# Patient Record
Sex: Female | Born: 1990 | Hispanic: No | Marital: Married | Smoking: Never smoker
Health system: Southern US, Community
[De-identification: ages and names within clinical notes are randomized; demographics above are authoritative.]

## PROBLEM LIST (undated history)

## (undated) ENCOUNTER — Inpatient Hospital Stay (HOSPITAL_COMMUNITY): Payer: Self-pay

## (undated) DIAGNOSIS — N979 Female infertility, unspecified: Secondary | ICD-10-CM

## (undated) DIAGNOSIS — J45909 Unspecified asthma, uncomplicated: Secondary | ICD-10-CM

## (undated) DIAGNOSIS — D649 Anemia, unspecified: Secondary | ICD-10-CM

## (undated) DIAGNOSIS — R51 Headache: Secondary | ICD-10-CM

## (undated) DIAGNOSIS — E559 Vitamin D deficiency, unspecified: Secondary | ICD-10-CM

## (undated) DIAGNOSIS — O24419 Gestational diabetes mellitus in pregnancy, unspecified control: Secondary | ICD-10-CM

## (undated) DIAGNOSIS — R7303 Prediabetes: Secondary | ICD-10-CM

## (undated) DIAGNOSIS — G43909 Migraine, unspecified, not intractable, without status migrainosus: Secondary | ICD-10-CM

## (undated) HISTORY — DX: Vitamin D deficiency, unspecified: E55.9

## (undated) HISTORY — DX: Female infertility, unspecified: N97.9

## (undated) HISTORY — PX: WISDOM TOOTH EXTRACTION: SHX21

## (undated) HISTORY — DX: Unspecified asthma, uncomplicated: J45.909

## (undated) HISTORY — DX: Prediabetes: R73.03

---
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
## 2013-02-01 ENCOUNTER — Ambulatory Visit: Payer: Self-pay | Admitting: Family Medicine

## 2013-02-01 VITALS — BP 100/78 | HR 81 | Temp 98.4°F | Resp 18 | Ht 61.75 in | Wt 130.0 lb

## 2013-02-01 DIAGNOSIS — L509 Urticaria, unspecified: Secondary | ICD-10-CM

## 2013-02-01 DIAGNOSIS — G43909 Migraine, unspecified, not intractable, without status migrainosus: Secondary | ICD-10-CM

## 2013-02-01 MED ORDER — PREDNISONE 20 MG PO TABS: ORAL_TABLET | ORAL | Status: DC

## 2013-02-01 MED ORDER — KETOPROFEN 50 MG PO CAPS: 50.0000 mg | ORAL_CAPSULE | Freq: Four times a day (QID) | ORAL | Status: DC | PRN

## 2013-02-01 NOTE — Progress Notes (Signed)
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  No new meds or detergents.  No shortness of breath or throat swelling.  Does not respond to benadryl which also makes her drowsy.  Positive h/o of asthma   Objective:  NAD Diffuse wheals on extremities Chest:  Clear Oroph:  Clear   Assessment:  Idiopathic urticaria

## 2013-02-01 NOTE — Patient Instructions (Signed)
Try also Zantac (ranitidine is the generic)  Hives Hives are itchy, red, swollen areas of the skin. They can vary in size and location on your body. Hives can come and go for hours or several days (acute hives) or for several weeks (chronic hives). Hives do not spread from person to person (noncontagious). They may get worse with scratching, exercise, and emotional stress. CAUSES   Allergic reaction to food, additives, or drugs.  Infections, including the common cold.  Illness, such as vasculitis, lupus, or thyroid disease.  Exposure to sunlight, heat, or cold.  Exercise.  Stress.  Contact with chemicals. SYMPTOMS   Red or white swollen patches on the skin. The patches may change size, shape, and location quickly and repeatedly.  Itching.  Swelling of the hands, feet, and face. This may occur if hives develop deeper in the skin. DIAGNOSIS  Your caregiver can usually tell what is wrong by performing a physical exam. Skin or blood tests may also be done to determine the cause of your hives. In some cases, the cause cannot be determined. TREATMENT  Mild cases usually get better with medicines such as antihistamines. Severe cases may require an emergency epinephrine injection. If the cause of your hives is known, treatment includes avoiding that trigger.  HOME CARE INSTRUCTIONS   Avoid causes that trigger your hives.  Take antihistamines as directed by your caregiver to reduce the severity of your hives. Non-sedating or low-sedating antihistamines are usually recommended. Do not drive while taking an antihistamine.  Take any other medicines prescribed for itching as directed by your caregiver.  Wear loose-fitting clothing.  Keep all follow-up appointments as directed by your caregiver. SEEK MEDICAL CARE IF:   You have persistent or severe itching that is not relieved with medicine.  You have painful or swollen joints. SEEK IMMEDIATE MEDICAL CARE IF:   You have a  fever.  Your tongue or lips are swollen.  You have trouble breathing or swallowing.  You feel tightness in the throat or chest.  You have abdominal pain. These problems may be the first sign of a life-threatening allergic reaction. Call your local emergency services (911 in [place] MAKE SURE YOU:   Understand these instructions.  Will watch your condition.  Will get help right away if you are not doing well or get worse. Document Released: 12/16/2005 Document Revised: 06/16/2012 Document Reviewed: 03/10/2012 [facility] Patient Information 2013 [place], [place]. Migraine Headache A migraine headache is an intense, throbbing pain on one or both sides of your head. A migraine can last for 30 minutes to several hours. CAUSES  The exact cause of a migraine headache is not always known. However, a migraine may be caused when nerves in the brain become irritated and release chemicals that cause inflammation. This causes pain. SYMPTOMS  Pain on one or both sides of your head.  Pulsating or throbbing pain.  Severe pain that prevents daily activities.  Pain that is aggravated by any physical activity.  Nausea, vomiting, or both.  Dizziness.  Pain with exposure to bright lights, loud noises, or activity.  General sensitivity to bright lights, loud noises, or smells. Before you get a migraine, you may get warning signs that a migraine is coming (aura). An aura may include:  Seeing flashing lights.  Seeing bright spots, halos, or zig-zag lines.  Having tunnel vision or blurred vision.  Having feelings of numbness or tingling.  Having trouble talking.  Having muscle weakness. MIGRAINE TRIGGERS  Alcohol.  Smoking.  Stress.  Menstruation.  Aged cheeses.  Foods or drinks that contain nitrates, glutamate, aspartame, or tyramine.  Lack of sleep.  Chocolate.  Caffeine.  Hunger.  Physical exertion.  Fatigue.  Medicines used to treat chest pain (nitroglycerine),  birth control pills, estrogen, and some blood pressure medicines. DIAGNOSIS  A migraine headache is often diagnosed based on:  Symptoms.  Physical examination.  A CT scan or MRI of your head. TREATMENT Medicines may be given for pain and nausea. Medicines can also be given to help prevent recurrent migraines.  HOME CARE INSTRUCTIONS  Only take over-the-counter or prescription medicines for pain or discomfort as directed by your caregiver. The use of long-term narcotics is not recommended.  Lie down in a dark, quiet room when you have a migraine.  Keep a journal to find out what may trigger your migraine headaches. For example, write down:  What you eat and drink.  How much sleep you get.  Any change to your diet or medicines.  Limit alcohol consumption.  Quit smoking if you smoke.  Get 7 to 9 hours of sleep, or as recommended by your caregiver.  Limit stress.  Keep lights dim if bright lights bother you and make your migraines worse. SEEK IMMEDIATE MEDICAL CARE IF:   Your migraine becomes severe.  You have a fever.  You have a stiff neck.  You have vision loss.  You have muscular weakness or loss of muscle control.  You start losing your balance or have trouble walking.  You feel faint or pass out.  You have severe symptoms that are different from your first symptoms. MAKE SURE YOU:   Understand these instructions.  Will watch your condition.  Will get help right away if you are not doing well or get worse. Document Released: 12/16/2005 Document Revised: 03/09/2012 Document Reviewed: 12/06/2011 [facility] Patient Information 2013 [place], [place].

## 2013-07-18 ENCOUNTER — Inpatient Hospital Stay (HOSPITAL_COMMUNITY)
Admission: AD | Admit: 2013-07-18 | Discharge: 2013-07-18 | Disposition: A | Discharge status: Home / Self Care | Payer: Medicaid Other | Source: Ambulatory Visit | Attending: Obstetrics & Gynecology | Admitting: Obstetrics & Gynecology

## 2013-07-18 ENCOUNTER — Encounter (HOSPITAL_COMMUNITY): Payer: Self-pay | Admitting: *Deleted

## 2013-07-18 DIAGNOSIS — O26899 Other specified pregnancy related conditions, unspecified trimester: Secondary | ICD-10-CM

## 2013-07-18 DIAGNOSIS — R109 Unspecified abdominal pain: Secondary | ICD-10-CM

## 2013-07-18 DIAGNOSIS — R1032 Left lower quadrant pain: Secondary | ICD-10-CM | POA: Insufficient documentation

## 2013-07-18 DIAGNOSIS — K5900 Constipation, unspecified: Secondary | ICD-10-CM | POA: Insufficient documentation

## 2013-07-18 DIAGNOSIS — O99891 Other specified diseases and conditions complicating pregnancy: Secondary | ICD-10-CM | POA: Insufficient documentation

## 2013-07-18 LAB — URINALYSIS, ROUTINE W REFLEX MICROSCOPIC
Glucose, UA: NEGATIVE mg/dL
Hgb urine dipstick: NEGATIVE
Ketones, ur: NEGATIVE mg/dL
Protein, ur: NEGATIVE mg/dL
Urobilinogen, UA: 0.2 mg/dL (ref 0.0–1.0)

## 2013-07-18 LAB — WET PREP, GENITAL
Clue Cells Wet Prep HPF POC: NONE SEEN
Yeast Wet Prep HPF POC: NONE SEEN

## 2013-07-18 LAB — URINE MICROSCOPIC-ADD ON

## 2013-07-18 NOTE — MAU Provider Note (Signed)
History     CSN: [id number]  Arrival date and time: 07/18/13 1222   First Provider Initiated Contact with Patient 07/18/13 1313      Chief Complaint  Patient presents with  . Possible Pregnancy  . Abdominal Pain   HPI  Pt is [redacted]w[redacted]d pregnant and presents with left lower quadrant pain for 1 to 1/2 weeks.  Pt has constipation.  Pt does have nausea and is dizzy in the morning when she wakes up.  Pt's pain is not accentuated with movement.  Sleep makes her pain better.Pt states pain is a little better after bowel movement.  Pt also has a vaginal discharge with vaginal itching.  Pt denies UTI symptoms.  Pt denies spotting or bleeding since May.   Past Medical History  Diagnosis Date  . Asthma   . Asthma     History reviewed. No pertinent past surgical history.  Family History  Problem Relation Age of Onset  . Diabetes Mother     History  Substance Use Topics  . Smoking status: Never Smoker   . Smokeless tobacco: Not on file  . Alcohol Use: No    Allergies: No Known Allergies  No prescriptions prior to admission    Review of Systems  Gastrointestinal: Positive for nausea, abdominal pain and constipation. Negative for vomiting and diarrhea.  Neurological: Positive for dizziness.   Physical Exam   Blood pressure 126/91, pulse 93, temperature 97.8 F (36.6 C), temperature source Oral, resp. rate 16, height 5\' 1"  (1.549 m), weight 61.236 kg (135 lb), last menstrual period 04/22/2013.  Physical Exam  Vitals reviewed. Constitutional: She is oriented to person, place, and time. She appears well-developed and well-nourished. No distress.  HENT:  Head: Normocephalic.  Eyes: Pupils are equal, round, and reactive to light.  Neck: Normal range of motion. Neck supple.  Cardiovascular: Normal rate.   Respiratory: Effort normal.  GI: Soft. She exhibits no distension. There is no tenderness. There is no rebound and no guarding.  Left lower mid quadrant pain with mild  tenderness with palpation- no rebound.  FHT obtained  Genitourinary: Vagina normal.  Clean, cervix closed; uterus 12 week size; nontender; adnexa without palpable enlargement or tenderness-   Musculoskeletal: Normal range of motion.  Neurological: She is alert and oriented to person, place, and time.  Skin: Skin is warm and dry.  Psychiatric: She has a normal mood and affect.    MAU Course  Procedures Results for orders placed during the hospital encounter of 07/18/13 (from the past 24 hour(s))  URINALYSIS, ROUTINE W REFLEX MICROSCOPIC     Status: Abnormal   Collection Time    07/18/13  1:00 PM      Result Value Range   Color, Urine YELLOW  YELLOW   APPearance CLEAR  CLEAR   Specific Gravity, Urine >1.030 (*) 1.005 - 1.030   pH 6.0  5.0 - 8.0   Glucose, UA NEGATIVE  NEGATIVE mg/dL   Hgb urine dipstick NEGATIVE  NEGATIVE   Bilirubin Urine NEGATIVE  NEGATIVE   Ketones, ur NEGATIVE  NEGATIVE mg/dL   Protein, ur NEGATIVE  NEGATIVE mg/dL   Urobilinogen, UA 0.2  0.0 - 1.0 mg/dL   Nitrite NEGATIVE  NEGATIVE   Leukocytes, UA TRACE (*) NEGATIVE  URINE MICROSCOPIC-ADD ON     Status: Abnormal   Collection Time    07/18/13  1:00 PM      Result Value Range   Squamous Epithelial / LPF FEW (*) RARE   WBC, UA  3-6  <3 WBC/hpf   RBC / HPF 0-2  <3 RBC/hpf   Bacteria, UA FEW (*) RARE   Results for orders placed during the hospital encounter of 07/18/13 (from the past 24 hour(s))  URINALYSIS, ROUTINE W REFLEX MICROSCOPIC     Status: Abnormal   Collection Time    07/18/13  1:00 PM      Result Value Range   Color, Urine YELLOW  YELLOW   APPearance CLEAR  CLEAR   Specific Gravity, Urine >1.030 (*) 1.005 - 1.030   pH 6.0  5.0 - 8.0   Glucose, UA NEGATIVE  NEGATIVE mg/dL   Hgb urine dipstick NEGATIVE  NEGATIVE   Bilirubin Urine NEGATIVE  NEGATIVE   Ketones, ur NEGATIVE  NEGATIVE mg/dL   Protein, ur NEGATIVE  NEGATIVE mg/dL   Urobilinogen, UA 0.2  0.0 - 1.0 mg/dL   Nitrite NEGATIVE  NEGATIVE    Leukocytes, UA TRACE (*) NEGATIVE  URINE MICROSCOPIC-ADD ON     Status: Abnormal   Collection Time    07/18/13  1:00 PM      Result Value Range   Squamous Epithelial / LPF FEW (*) RARE   WBC, UA 3-6  <3 WBC/hpf   RBC / HPF 0-2  <3 RBC/hpf   Bacteria, UA FEW (*) RARE  WET PREP, GENITAL     Status: Abnormal   Collection Time    07/18/13  1:55 PM      Result Value Range   Yeast Wet Prep HPF POC NONE SEEN  NONE SEEN   Trich, Wet Prep NONE SEEN  NONE SEEN   Clue Cells Wet Prep HPF POC NONE SEEN  NONE SEEN   WBC, Wet Prep HPF POC MANY (*) NONE SEEN    Assessment and Plan  abd pain in pregnancy Constipation F/u with OB care- pt desires to go to [facility] clinic- message sent to clinic  [facility] 07/18/2013, 1:15 PM

## 2013-07-18 NOTE — MAU Provider Note (Signed)
Attestation of Attending Supervision of Advanced Practitioner (PA/CNM/NP): Evaluation and management procedures were performed by the Advanced Practitioner under my supervision and collaboration.  I have reviewed the Advanced Practitioner's note and chart, and I agree with the management and plan.  Kyo Cocuzza, MD, FACOG Attending Obstetrician & Gynecologist Faculty Practice, [facility]  

## 2013-07-18 NOTE — MAU Note (Signed)
Pt presents to MAU with complaints of abdominal cramping and states she had a positive pregnancy test at home about a month ago. Denies any bleeding or LOF.

## 2013-07-19 LAB — GC/CHLAMYDIA PROBE AMP: CT Probe RNA: NEGATIVE

## 2013-07-20 LAB — URINE CULTURE
Colony Count: NO GROWTH
Culture: NO GROWTH

## 2013-08-08 ENCOUNTER — Inpatient Hospital Stay (HOSPITAL_COMMUNITY)
Admission: AD | Admit: 2013-08-08 | Discharge: 2013-08-08 | Disposition: A | Discharge status: Home / Self Care | Payer: Medicaid Other | Source: Ambulatory Visit | Attending: Obstetrics and Gynecology | Admitting: Obstetrics and Gynecology

## 2013-08-08 ENCOUNTER — Encounter (HOSPITAL_COMMUNITY): Payer: Self-pay

## 2013-08-08 DIAGNOSIS — R1013 Epigastric pain: Secondary | ICD-10-CM | POA: Insufficient documentation

## 2013-08-08 DIAGNOSIS — L293 Anogenital pruritus, unspecified: Secondary | ICD-10-CM | POA: Insufficient documentation

## 2013-08-08 DIAGNOSIS — O219 Vomiting of pregnancy, unspecified: Secondary | ICD-10-CM

## 2013-08-08 DIAGNOSIS — O210 Mild hyperemesis gravidarum: Secondary | ICD-10-CM | POA: Insufficient documentation

## 2013-08-08 DIAGNOSIS — O26892 Other specified pregnancy related conditions, second trimester: Secondary | ICD-10-CM

## 2013-08-08 DIAGNOSIS — K219 Gastro-esophageal reflux disease without esophagitis: Secondary | ICD-10-CM | POA: Insufficient documentation

## 2013-08-08 DIAGNOSIS — O2390 Unspecified genitourinary tract infection in pregnancy, unspecified trimester: Secondary | ICD-10-CM | POA: Insufficient documentation

## 2013-08-08 DIAGNOSIS — B3731 Acute candidiasis of vulva and vagina: Secondary | ICD-10-CM | POA: Insufficient documentation

## 2013-08-08 DIAGNOSIS — B373 Candidiasis of vulva and vagina: Secondary | ICD-10-CM

## 2013-08-08 LAB — URINALYSIS, ROUTINE W REFLEX MICROSCOPIC
Nitrite: NEGATIVE
Protein, ur: NEGATIVE mg/dL
Specific Gravity, Urine: >1.03 — ABNORMAL HIGH (ref 1.005–1.030)
Urobilinogen, UA: 1 mg/dL (ref 0.0–1.0)

## 2013-08-08 LAB — OB RESULTS CONSOLE GC/CHLAMYDIA
Chlamydia: NEGATIVE
GC PROBE AMP, GENITAL: NEGATIVE

## 2013-08-08 LAB — WET PREP, GENITAL: Clue Cells Wet Prep HPF POC: NONE SEEN

## 2013-08-08 LAB — URINE MICROSCOPIC-ADD ON

## 2013-08-08 MED ORDER — FLUCONAZOLE 150 MG PO TABS: 150.0000 mg | ORAL_TABLET | Freq: Once | ORAL | Status: DC

## 2013-08-08 MED ORDER — PRENATAL PLUS 27-1 MG PO TABS: 1.0000 | ORAL_TABLET | Freq: Every day | ORAL | Status: DC

## 2013-08-08 MED ORDER — RANITIDINE HCL 150 MG PO TABS: 150.0000 mg | ORAL_TABLET | Freq: Two times a day (BID) | ORAL | Status: DC

## 2013-08-08 MED ORDER — PROMETHAZINE HCL 12.5 MG PO TABS: 12.5000 mg | ORAL_TABLET | Freq: Four times a day (QID) | ORAL | Status: DC | PRN

## 2013-08-08 NOTE — MAU Note (Signed)
Pt states having upper abdominal pain x1 week that radiates down to mid abdomen and moves up to epigastric area. Has had issues with acid reflux/indigestion, and also has been experiencing headaches so severe that she vomits. Last h/a was yesterday. Does have prior hx of migraines as child (per pt's mother). Denies abnormal vaginal discharge or bleeding.

## 2013-08-08 NOTE — MAU Provider Note (Signed)
Chief Complaint: Abdominal Pain   First Provider Initiated Contact with Patient 08/08/13 1815     SUBJECTIVE HPI: Jessica Horton is a 22 y.o. G1P0 at [redacted]w[redacted]d by LMP who presents with epigastric abdominal pain and substernal burning present for 2-4 weeks and unrelieved by OTC antacids. In addition she has nausea and vomiting and states she vomits after every meal. She has vaginal pruritus and thick white discharge.  PNC: seen MAU at 12 wk for abd pain. Has appt at [facility]  Past Medical History  Diagnosis Date  . Asthma   . Asthma   Occ inhaler use  OB History   Grav Para Term Preterm Abortions TAB SAB Ect Mult Living   1 0 0 0 0 0 0 0 0 0      # Outc Date GA Lbr Len/2nd Wgt Sex Del Anes PTL Lv   1 CUR              Past Surgical History  Procedure Laterality Date  . Wisdom tooth extraction     History   Social History  . Marital Status: Single    Spouse Name: N/A    Number of Children: N/A  . Years of Education: N/A   Occupational History  . Not on file.   Social History Main Topics  . Smoking status: Never Smoker   . Smokeless tobacco: Never Used  . Alcohol Use: No  . Drug Use: No  . Sexually Active: Yes    Birth Control/ Protection: None   Other Topics Concern  . Not on file   Social History Narrative  . No narrative on file   No current facility-administered medications on file prior to encounter.   No current outpatient prescriptions on file prior to encounter.   No Known Allergies  ROS: Pertinent items in HPI  OBJECTIVE Blood pressure 111/48, pulse 82, temperature 98.5 F (36.9 C), temperature source Oral, resp. rate 18, height 5\' 2"  (1.575 m), weight 63.141 kg (139 lb 3.2 oz), last menstrual period 04/22/2013. GENERAL: Well-developed, well-nourished female in no acute distress.  HEENT: Normocephalic HEART: normal rate RESP: normal effort ABDOMEN: Soft, slight subcostal and epigastric tenderness, S=D,  DT 150 EXTREMITIES: Nontender, no  edema NEURO: Alert and oriented  Pelvic by Venia Carbon FNP: SPECULUM EXAM: NEFG, thick white discharge discharge, no blood noted, cervix clean BIMANUAL: cervix L/C; uterus NT  LAB RESULTS Results for orders placed during the hospital encounter of 08/08/13 (from the past 24 hour(s))  URINALYSIS, ROUTINE W REFLEX MICROSCOPIC     Status: Abnormal   Collection Time    08/08/13  5:05 PM      Result Value Range   Color, Urine YELLOW  YELLOW   APPearance HAZY (*) CLEAR   Specific Gravity, Urine >1.030 (*) 1.005 - 1.030   pH 7.0  5.0 - 8.0   Glucose, UA NEGATIVE  NEGATIVE mg/dL   Hgb urine dipstick TRACE (*) NEGATIVE   Bilirubin Urine NEGATIVE  NEGATIVE   Ketones, ur NEGATIVE  NEGATIVE mg/dL   Protein, ur NEGATIVE  NEGATIVE mg/dL   Urobilinogen, UA 1.0  0.0 - 1.0 mg/dL   Nitrite NEGATIVE  NEGATIVE   Leukocytes, UA LARGE (*) NEGATIVE  URINE MICROSCOPIC-ADD ON     Status: Abnormal   Collection Time    08/08/13  5:05 PM      Result Value Range   Squamous Epithelial / LPF MANY (*) RARE   WBC, UA TOO NUMEROUS TO COUNT  <3 WBC/hpf  RBC / HPF 0-2  <3 RBC/hpf   Bacteria, UA RARE  RARE   Urine-Other MUCOUS PRESENT    WET PREP, GENITAL     Status: Abnormal   Collection Time    08/08/13  6:22 PM      Result Value Range   Yeast Wet Prep HPF POC FEW (*) NONE SEEN   Trich, Wet Prep NONE SEEN  NONE SEEN   Clue Cells Wet Prep HPF POC NONE SEEN  NONE SEEN   WBC, Wet Prep HPF POC MANY (*) NONE SEEN    IMAGING No results found.  MAU COURSE Urine culture sent  ASSESSMENT 1. Acid reflux   2. Nausea and vomiting in pregnancy prior to [redacted] weeks gestation   3. Yeast vaginitis   G1 at [redacted]w[redacted]d  PLAN Discharge home Follow-up Information   Follow up with [facility] On 08/08/2013. (Keep your scheduled appointment at [facility])        Medication List         acetaminophen 325 MG tablet  Commonly known as:  TYLENOL  Take 325 mg by mouth every 6 (six) hours as  needed for pain.     albuterol 108 (90 BASE) MCG/ACT inhaler  Commonly known as:  PROVENTIL HFA;VENTOLIN HFA  Inhale 2 puffs into the lungs every 6 (six) hours as needed (asthma).     FLOVENT IN  Inhale 1 puff into the lungs 2 (two) times daily as needed (asthma). Unknown strength     fluconazole 150 MG tablet  Commonly known as:  DIFLUCAN  Take 1 tablet (150 mg total) by mouth once.     prenatal vitamin w/FE, FA 27-1 MG Tabs tablet  Take 1 tablet by mouth daily.     promethazine 12.5 MG tablet  Commonly known as:  PHENERGAN  Take 1 tablet (12.5 mg total) by mouth every 6 (six) hours as needed for nausea.     ranitidine 150 MG tablet  Commonly known as:  ZANTAC  Take 1 tablet (150 mg total) by mouth 2 (two) times daily.       See AVS   Danae Orleans, CNM 08/08/2013  6:21 PM

## 2013-08-08 NOTE — MAU Note (Signed)
Pt reports having sharp upper abd pain ( on her diaphragm) that gets worse when she eats . Pain has been present for a week. C/O increased heartburn as well. States pain makes it hard to breath.

## 2013-08-09 LAB — URINE CULTURE

## 2013-08-09 LAB — GC/CHLAMYDIA PROBE AMP: CT Probe RNA: NEGATIVE

## 2013-08-10 ENCOUNTER — Telehealth: Payer: Self-pay | Admitting: Medical

## 2013-08-10 DIAGNOSIS — O2342 Unspecified infection of urinary tract in pregnancy, second trimester: Secondary | ICD-10-CM

## 2013-08-10 MED ORDER — AMOXICILLIN 500 MG PO CAPS: 500.0000 mg | ORAL_CAPSULE | Freq: Three times a day (TID) | ORAL | Status: DC

## 2013-08-10 NOTE — Telephone Encounter (Signed)
Called and spoke with the patient. Informed her of the UTI and Rx at the pharmacy. Patient asked if she should still take the Diflucan previously prescribed and I told her that she did still need that medication as it was for a different type of infection. Patient voiced understanding and will pick up Rx today.   Freddi Starr, PA-C 08/10/2013 9:34 AM

## 2013-08-13 NOTE — MAU Provider Note (Signed)
Attestation of Attending Supervision of Advanced Practitioner: Evaluation and management procedures were performed by the PA/NP/CNM/OB Fellow under my supervision/collaboration. Chart reviewed and agree with management and plan.  Danyel Griess V 08/13/2013 6:00 AM

## 2013-08-24 ENCOUNTER — Ambulatory Visit (INDEPENDENT_AMBULATORY_CARE_PROVIDER_SITE_OTHER): Payer: Medicaid Other | Admitting: *Deleted

## 2013-08-24 ENCOUNTER — Encounter: Payer: Self-pay | Admitting: *Deleted

## 2013-08-24 ENCOUNTER — Encounter: Payer: Self-pay | Admitting: Obstetrics and Gynecology

## 2013-08-24 VITALS — BP 110/71 | Wt 140.1 lb

## 2013-08-24 DIAGNOSIS — Z3402 Encounter for supervision of normal first pregnancy, second trimester: Secondary | ICD-10-CM

## 2013-08-24 LAB — POCT URINALYSIS DIP (DEVICE)
Glucose, UA: NEGATIVE mg/dL
Ketones, ur: NEGATIVE mg/dL
Specific Gravity, Urine: 1.025 (ref 1.005–1.030)

## 2013-08-24 NOTE — Progress Notes (Signed)
Pulse: 90 Pt in for her nurse interview. Given early glucola due to mother being diabetic.

## 2013-08-24 NOTE — Addendum Note (Signed)
Addended by: Franchot Mimes on: 08/24/2013 02:06 PM   Modules accepted: Orders

## 2013-08-25 LAB — OBSTETRIC PANEL
Eosinophils Absolute: 0.1 10*3/uL (ref 0.0–0.7)
HCT: 32.1 % — ABNORMAL LOW (ref 36.0–46.0)
Hemoglobin: 11.3 g/dL — ABNORMAL LOW (ref 12.0–15.0)
Hepatitis B Surface Ag: NEGATIVE
Lymphs Abs: 1.9 10*3/uL (ref 0.7–4.0)
MCH: 30.5 pg (ref 26.0–34.0)
Monocytes Relative: 5 % (ref 3–12)
Neutrophils Relative %: 71 % (ref 43–77)
RBC: 3.7 MIL/uL — ABNORMAL LOW (ref 3.87–5.11)
Rh Type: POSITIVE

## 2013-08-26 ENCOUNTER — Encounter: Payer: Self-pay | Admitting: *Deleted

## 2013-08-26 LAB — HEMOGLOBINOPATHY EVALUATION: Hemoglobin Other: 0 %

## 2013-09-01 ENCOUNTER — Ambulatory Visit (HOSPITAL_COMMUNITY)
Admission: RE | Admit: 2013-09-01 | Discharge: 2013-09-01 | Disposition: A | Discharge status: Home / Self Care | Payer: Medicaid Other | Source: Ambulatory Visit | Attending: Obstetrics and Gynecology | Admitting: Obstetrics and Gynecology

## 2013-09-01 ENCOUNTER — Other Ambulatory Visit: Payer: Self-pay | Admitting: Obstetrics and Gynecology

## 2013-09-01 DIAGNOSIS — Z3402 Encounter for supervision of normal first pregnancy, second trimester: Secondary | ICD-10-CM

## 2013-09-01 DIAGNOSIS — O358XX0 Maternal care for other (suspected) fetal abnormality and damage, not applicable or unspecified: Secondary | ICD-10-CM | POA: Insufficient documentation

## 2013-09-01 DIAGNOSIS — Z363 Encounter for antenatal screening for malformations: Secondary | ICD-10-CM | POA: Insufficient documentation

## 2013-09-01 DIAGNOSIS — Z1389 Encounter for screening for other disorder: Secondary | ICD-10-CM | POA: Insufficient documentation

## 2013-09-01 LAB — OB RESULTS CONSOLE GBS: STREP GROUP B AG: POSITIVE

## 2013-09-01 NOTE — Progress Notes (Signed)
Jessica Horton  was seen today for an ultrasound appointment.  See full report in [facility].  Impression: Single IUP at 19 4/7 weeks Normal fetal anatomic survey No markers associated with aneuploidy noted Normal amniotic fluid volume  Recommendations: Follow-up ultrasounds as clinically indicated.   Alpha Gula, MD

## 2013-09-02 ENCOUNTER — Encounter: Payer: Self-pay | Admitting: Obstetrics and Gynecology

## 2013-09-03 ENCOUNTER — Encounter: Payer: Self-pay | Admitting: *Deleted

## 2013-09-24 ENCOUNTER — Encounter: Payer: Medicaid Other | Admitting: Family

## 2014-01-22 ENCOUNTER — Inpatient Hospital Stay (HOSPITAL_COMMUNITY)
Admission: AD | Admit: 2014-01-22 | Discharge: 2014-01-27 | DRG: 766 | Disposition: A | Discharge status: Home / Self Care | Payer: Medicaid Other | Source: Ambulatory Visit | Attending: Obstetrics and Gynecology | Admitting: Obstetrics and Gynecology

## 2014-01-22 ENCOUNTER — Encounter (HOSPITAL_COMMUNITY): Payer: Self-pay

## 2014-01-22 ENCOUNTER — Inpatient Hospital Stay (HOSPITAL_COMMUNITY): Payer: Medicaid Other

## 2014-01-22 DIAGNOSIS — O480 Post-term pregnancy: Secondary | ICD-10-CM

## 2014-01-22 DIAGNOSIS — B951 Streptococcus, group B, as the cause of diseases classified elsewhere: Secondary | ICD-10-CM | POA: Diagnosis present

## 2014-01-22 DIAGNOSIS — D649 Anemia, unspecified: Secondary | ICD-10-CM | POA: Diagnosis not present

## 2014-01-22 DIAGNOSIS — O9903 Anemia complicating the puerperium: Secondary | ICD-10-CM | POA: Diagnosis not present

## 2014-01-22 DIAGNOSIS — O2340 Unspecified infection of urinary tract in pregnancy, unspecified trimester: Secondary | ICD-10-CM

## 2014-01-22 DIAGNOSIS — G43909 Migraine, unspecified, not intractable, without status migrainosus: Secondary | ICD-10-CM | POA: Diagnosis not present

## 2014-01-22 DIAGNOSIS — Z22330 Carrier of Group B streptococcus: Secondary | ICD-10-CM

## 2014-01-22 DIAGNOSIS — O4100X0 Oligohydramnios, unspecified trimester, not applicable or unspecified: Secondary | ICD-10-CM

## 2014-01-22 DIAGNOSIS — O0930 Supervision of pregnancy with insufficient antenatal care, unspecified trimester: Secondary | ICD-10-CM

## 2014-01-22 DIAGNOSIS — O324XX0 Maternal care for high head at term, not applicable or unspecified: Secondary | ICD-10-CM | POA: Diagnosis present

## 2014-01-22 DIAGNOSIS — O9989 Other specified diseases and conditions complicating pregnancy, childbirth and the puerperium: Secondary | ICD-10-CM

## 2014-01-22 DIAGNOSIS — O99892 Other specified diseases and conditions complicating childbirth: Secondary | ICD-10-CM | POA: Diagnosis present

## 2014-01-22 DIAGNOSIS — J45909 Unspecified asthma, uncomplicated: Secondary | ICD-10-CM | POA: Diagnosis not present

## 2014-01-22 HISTORY — DX: Headache: R51

## 2014-01-22 HISTORY — DX: Migraine, unspecified, not intractable, without status migrainosus: G43.909

## 2014-01-22 LAB — CBC
HCT: 36.4 % (ref 36.0–46.0)
Hemoglobin: 12.3 g/dL (ref 12.0–15.0)
MCH: 29.1 pg (ref 26.0–34.0)
MCHC: 33.8 g/dL (ref 30.0–36.0)
MCV: 86.3 fL (ref 78.0–100.0)
PLATELETS: 219 10*3/uL (ref 150–400)
RBC: 4.22 MIL/uL (ref 3.87–5.11)
RDW: 13.1 % (ref 11.5–15.5)
WBC: 10.7 10*3/uL — ABNORMAL HIGH (ref 4.0–10.5)

## 2014-01-22 MED ORDER — ONDANSETRON HCL 4 MG/2ML IJ SOLN
4.0000 mg | Freq: Four times a day (QID) | INTRAMUSCULAR | Status: DC | PRN
Start: 2014-01-22 — End: 2014-01-24
  Administered 2014-01-23 – 2014-01-24 (×3): 4 mg via INTRAVENOUS
  Filled 2014-01-22 (×3): qty 2

## 2014-01-22 MED ORDER — LACTATED RINGERS IV SOLN: 500.0000 mL | INTRAVENOUS | Status: DC | PRN

## 2014-01-22 MED ORDER — FENTANYL CITRATE 0.05 MG/ML IJ SOLN
100.0000 ug | INTRAMUSCULAR | Status: DC | PRN
  Administered 2014-01-23 (×3): 100 ug via INTRAVENOUS
  Filled 2014-01-22 (×3): qty 2

## 2014-01-22 MED ORDER — ACETAMINOPHEN 325 MG PO TABS
650.0000 mg | ORAL_TABLET | ORAL | Status: DC | PRN
  Administered 2014-01-23 – 2014-01-24 (×3): 650 mg via ORAL
  Filled 2014-01-22 (×3): qty 2

## 2014-01-22 MED ORDER — PENICILLIN G POTASSIUM 5000000 UNITS IJ SOLR
2.5000 10*6.[IU] | INTRAVENOUS | Status: DC
  Administered 2014-01-23 – 2014-01-24 (×8): 2.5 10*6.[IU] via INTRAVENOUS
  Filled 2014-01-22 (×11): qty 2.5

## 2014-01-22 MED ORDER — FLEET ENEMA 7-19 GM/118ML RE ENEM
1.0000 | ENEMA | RECTAL | Status: DC | PRN
Start: 2014-01-22 — End: 2014-01-24

## 2014-01-22 MED ORDER — LIDOCAINE HCL (PF) 1 % IJ SOLN
30.0000 mL | INTRAMUSCULAR | Status: DC | PRN
  Filled 2014-01-22: qty 30

## 2014-01-22 MED ORDER — OXYCODONE-ACETAMINOPHEN 5-325 MG PO TABS: 1.0000 | ORAL_TABLET | ORAL | Status: DC | PRN

## 2014-01-22 MED ORDER — OXYTOCIN BOLUS FROM INFUSION: 500.0000 mL | INTRAVENOUS | Status: DC

## 2014-01-22 MED ORDER — LACTATED RINGERS IV SOLN
INTRAVENOUS | Status: DC
  Administered 2014-01-22: 21:00:00 via INTRAVENOUS
  Administered 2014-01-23: 500 mL via INTRAVENOUS
  Administered 2014-01-23 – 2014-01-24 (×6): via INTRAVENOUS

## 2014-01-22 MED ORDER — PENICILLIN G POTASSIUM 5000000 UNITS IJ SOLR
5.0000 10*6.[IU] | Freq: Once | INTRAVENOUS | Status: AC
  Administered 2014-01-22: 5 10*6.[IU] via INTRAVENOUS
  Filled 2014-01-22: qty 5

## 2014-01-22 MED ORDER — IBUPROFEN 600 MG PO TABS: 600.0000 mg | ORAL_TABLET | Freq: Four times a day (QID) | ORAL | Status: DC | PRN

## 2014-01-22 MED ORDER — CITRIC ACID-SODIUM CITRATE 334-500 MG/5ML PO SOLN
30.0000 mL | ORAL | Status: DC | PRN
Start: 2014-01-22 — End: 2014-01-24
  Administered 2014-01-24: 30 mL via ORAL
  Filled 2014-01-22: qty 15

## 2014-01-22 MED ORDER — OXYTOCIN 40 UNITS IN LACTATED RINGERS INFUSION - SIMPLE MED: 62.5000 mL/h | INTRAVENOUS | Status: DC

## 2014-01-22 NOTE — Progress Notes (Signed)
Notified of pt return from u/s and results. Will come recheck pt

## 2014-01-22 NOTE — MAU Provider Note (Signed)
History   23 yo G1P0 at 40 weeks presented with contractions since 11:30am today, with brown spotting.  Called this CNM eaerly afternoon c/o spotting, with UCs q 5 min.  Was recommended to continue to observe contractions for increase in intensity.  Denies leaking, reports +FM.  Patient Active Problem List   Diagnosis Date Noted  . Asthma, chronic 01/22/2014  . GBS (group B streptococcus) UTI complicating pregnancy 01/22/2014  . Migraines 01/22/2014  . Late prenatal care 01/22/2014   \  Chief Complaint  Patient presents with  . Labor Eval   HPI:  See above  OB History   Grav Para Term Preterm Abortions TAB SAB Ect Mult Living   1 0 0 0 0 0 0 0 0 0       Past Medical History  Diagnosis Date  . Asthma   . Asthma     Past Surgical History  Procedure Laterality Date  . Wisdom tooth extraction      Family History  Problem Relation Age of Onset  . Diabetes Mother     History  Substance Use Topics  . Smoking status: Never Smoker   . Smokeless tobacco: Never Used  . Alcohol Use: No    Allergies: No Known Allergies  Prescriptions prior to admission  Medication Sig Dispense Refill  . Fluticasone Propionate, Inhal, (FLOVENT IN) Inhale 1 puff into the lungs daily. Unknown strength      . prenatal vitamin w/FE, FA (PRENATAL 1 + 1) 27-1 MG TABS tablet Take 1 tablet by mouth daily.  30 each  0  . albuterol (PROVENTIL HFA;VENTOLIN HFA) 108 (90 BASE) MCG/ACT inhaler Inhale 2 puffs into the lungs every 6 (six) hours as needed (asthma).        ROS:  Contractions, +FM, brown d/c Physical Exam   Blood pressure 112/63, pulse 87, temperature 98 F (36.7 C), temperature source Oral, resp. rate 18, last menstrual period 04/17/2013.  Physical Exam Chest clear Heart RRR without murmur Abd gravid, NT: Pelvic--cervix posterior, FT, 50%, vtx, -2--vtx verified by BS US Ext WNL  FHR--initially non-reactive, now Category 1 UCs q 3-5  ED Course  IUP at 40 weeks ? Early vs  prodromal labor GBS positive  Plan: Observe FHR , recheck cervix  Branch Pacitti CNM, [place] 01/22/2014 6:34 PM  Addendum: Mild variables noted on NST, but negative spontaneous CST. Will check BPP and AFI.  Nigel BridgemanVicki Shery [place], CNM 01/22/14 6:50p

## 2014-01-22 NOTE — MAU Note (Signed)
Pt presents complaining of contractions that started at 1130am and are every apart. States she has had some brown bloody discharge but no loss of fluid

## 2014-01-23 ENCOUNTER — Inpatient Hospital Stay (HOSPITAL_COMMUNITY): Payer: Medicaid Other | Admitting: Anesthesiology

## 2014-01-23 ENCOUNTER — Encounter (HOSPITAL_COMMUNITY): Payer: Medicaid Other | Admitting: Anesthesiology

## 2014-01-23 LAB — RPR: RPR Ser Ql: NONREACTIVE

## 2014-01-23 MED ORDER — EPHEDRINE 5 MG/ML INJ: 10.0000 mg | INTRAVENOUS | Status: DC | PRN

## 2014-01-23 MED ORDER — LACTATED RINGERS IV SOLN
500.0000 mL | Freq: Once | INTRAVENOUS | Status: AC
  Administered 2014-01-23: 1000 mL via INTRAVENOUS

## 2014-01-23 MED ORDER — OXYTOCIN 40 UNITS IN LACTATED RINGERS INFUSION - SIMPLE MED
1.0000 m[IU]/min | INTRAVENOUS | Status: DC
  Administered 2014-01-23: 2 m[IU]/min via INTRAVENOUS
  Filled 2014-01-23: qty 1000

## 2014-01-23 MED ORDER — DIPHENHYDRAMINE HCL 50 MG/ML IJ SOLN
12.5000 mg | INTRAMUSCULAR | Status: DC | PRN
  Administered 2014-01-23: 12.5 mg via INTRAVENOUS
  Filled 2014-01-23: qty 1

## 2014-01-23 MED ORDER — LIDOCAINE HCL (PF) 1 % IJ SOLN
INTRAMUSCULAR | Status: DC | PRN
  Administered 2014-01-23 (×2): 5 mL

## 2014-01-23 MED ORDER — FENTANYL 2.5 MCG/ML BUPIVACAINE 1/10 % EPIDURAL INFUSION (WH - ANES)
INTRAMUSCULAR | Status: AC
  Filled 2014-01-23: qty 125

## 2014-01-23 MED ORDER — PHENYLEPHRINE 40 MCG/ML (10ML) SYRINGE FOR IV PUSH (FOR BLOOD PRESSURE SUPPORT): 80.0000 ug | PREFILLED_SYRINGE | INTRAVENOUS | Status: DC | PRN

## 2014-01-23 MED ORDER — FENTANYL 2.5 MCG/ML BUPIVACAINE 1/10 % EPIDURAL INFUSION (WH - ANES)
14.0000 mL/h | INTRAMUSCULAR | Status: DC | PRN
  Administered 2014-01-23 – 2014-01-24 (×3): 14 mL/h via EPIDURAL
  Filled 2014-01-23 (×2): qty 125

## 2014-01-23 MED ORDER — PHENYLEPHRINE 40 MCG/ML (10ML) SYRINGE FOR IV PUSH (FOR BLOOD PRESSURE SUPPORT)
PREFILLED_SYRINGE | INTRAVENOUS | Status: AC
  Filled 2014-01-23: qty 10

## 2014-01-23 MED ORDER — EPHEDRINE 5 MG/ML INJ
INTRAVENOUS | Status: AC
  Filled 2014-01-23: qty 4

## 2014-01-23 NOTE — MAU Provider Note (Signed)
  Subjective: Comfortable, visiting with friends and family.  Objective: BP 127/79  Pulse 97  Temp(Src) 99.7 F (37.6 C) (Axillary)  Resp 18  Ht 5\' 1"  (1.549 m)  Wt 176 lb (79.833 kg)  BMI 33.27 kg/m2  SpO2 81%  LMP 04/17/2013 I/O last 3 completed shifts: In: -  Out: 1350 [Urine:1350]    FHT:  Cat II - Halved pit, bolus UC:   regular, every 1.5 - 2.5 minutes  SVE:   Dilation: Lip/rim Effacement (%): 100 Station: +1 Exam by:: Dontrey Snellgrove CNM  Assessment / Plan:  Augmentation of labor, progressing well  Labor: Progressing normally  Preeclampsia: no signs or symptoms of toxicity  Fetal Wellbeing: Category II  Pain Control: Epidural  I/D: GBS pos; PCN; AROM; Afebrile   Anticipated MOD: NSVD    Jessica Horton 01/23/2014, 9:36 PM

## 2014-01-23 NOTE — Progress Notes (Signed)
  Subjective: Pt is more comfortable with epidural.  Objective: BP 108/67  Pulse 63  Temp(Src) 98.2 F (36.8 C) (Oral)  Resp 16  Ht 5\' 1"  (1.549 m)  Wt 176 lb (79.833 kg)  BMI 33.27 kg/m2  SpO2 100%  LMP 04/17/2013      FHT:  Cat I UC:   regular, every 2-4 minutes  SVE:   Dilation: 4 Effacement (%): 90 Station: -2 Exam by:: J.Ashlynne Shetterly, CNM  Assessment / Plan:  Augmentation of labor, progressing well Pitocin at 8 miliU Labor: Progressing on Pitocin, will continue to increase then AROM  Preeclampsia: no signs or symptoms of toxicity  Fetal Wellbeing: Category I  Pain Control: Epidural  I/D: GBS pos; PCN; Intact; Afebrile  Anticipated MOD: NSVD   Hawa Henly 01/23/2014, 11:44 AM

## 2014-01-23 NOTE — Progress Notes (Signed)
  Subjective: Pt is very uncomfortable and asking for epidural.  Objective: BP 93/43  Pulse 72  Temp(Src) 98.2 F (36.8 C) (Oral)  Resp 16  Ht 5\' 1"  (1.549 m)  Wt 176 lb (79.833 kg)  BMI 33.27 kg/m2  LMP 04/17/2013      FHT:  Cat II UC:   regular, every 2-4 minutes  SVE:   Dilation: 2 Effacement (%): 80 Station: -2 Exam by:: Annamary RummageJ. Isbella Arline, CNM  Assessment / Plan:  Prodromal labor, Pitocin at 8 miliU  Labor: Progressing on Pitocin, will continue to increase then AROM  Preeclampsia: no signs or symptoms of toxicity  Fetal Wellbeing: Category II  Pain Control: Epidural to be placed soon I/D: GBS pos; PCN; Intact; Afebrile  Anticipated MOD: NSVD   Ellis Koffler 01/23/2014, 8:15 AM

## 2014-01-23 NOTE — Progress Notes (Signed)
Patient ID: Jessica Horton, female   DOB: 12-05-1991, 23 y.o.   MRN: [id number] Jessica Horton is a 23 y.o. G1P0000 at 3461w1d admitted for early labor, FHR variables  Subjective: Has rcv'd 2 doses IV fentanyl w mod relief, plans epidural eventually  Objective: BP 97/46  Pulse 69  Temp(Src) 98.2 F (36.8 C) (Oral)  Resp 16  Ht 5\' 1"  (1.549 m)  Wt 176 lb (79.833 kg)  BMI 33.27 kg/m2  LMP 04/17/2013     FHT:  Cat 1, still has occasional mild variables, overall reassuring UC:   toco 2-3  SVE:   Dilation: 1 Effacement (%): 80 Station: -2 Exam by:: Jessica Horton, RNC  Exam deferred at this time, due to pt not tolerating VE well   Assessment / Plan:  Labor: prodrome, on pitocin now Preeclampsia:  no s/s Fetal Wellbeing:  Category I Pain Control:  Fentanyl Anticipated MOD:  NSVD  GBS pos, has rcv'd PCN  Continue pitocin, epidural PRN, consider foley bulb if no cervical change after pt gets epidural    Update physician PRN   Jessica Horton

## 2014-01-23 NOTE — Progress Notes (Signed)
  Subjective: Pt comfortable visiting with family.  Objective: BP 118/71  Pulse 74  Temp(Src) 99.3 F (37.4 C) (Axillary)  Resp 18  Ht 5\' 1"  (1.549 m)  Wt 176 lb (79.833 kg)  BMI 33.27 kg/m2  SpO2 81%  LMP 04/17/2013 I/O last 3 completed shifts: In: -  Out: 1350 [Urine:1350]    FHT:  Cat II - position change UC:   regular, every 2-3.5 minutes  SVE:   Dilation: Lip/rim Effacement (%): 100 Station: +1 Exam by:: Chiffon Kittleson CNM  Assessment / Plan:  Augmentation of labor, progressing well  Labor: Progressing normally  Preeclampsia: no signs or symptoms of toxicity  Fetal Wellbeing: Category II  Pain Control: Epidural  I/D: GBS pos; PCN; AROM; Afebrile    Anticipated MOD: NSVD   Rowe Warman 01/23/2014, 11:35 PM

## 2014-01-23 NOTE — Anesthesia Preprocedure Evaluation (Addendum)
Anesthesia Evaluation  Patient identified by MRN, date of birth, ID band Patient awake    Reviewed: Allergy & Precautions, H&P , Patient's Chart, lab work & pertinent test results  Airway Mallampati: III TM Distance: >3 FB Neck ROM: full    Dental   Pulmonary asthma ,  breath sounds clear to auscultation        Cardiovascular Rhythm:regular Rate:Normal     Neuro/Psych  Headaches,    GI/Hepatic   Endo/Other    Renal/GU      Musculoskeletal   Abdominal   Peds  Hematology   Anesthesia Other Findings   Reproductive/Obstetrics (+) Pregnancy                           Anesthesia Physical Anesthesia Plan  ASA: II  Anesthesia Plan: Epidural   Post-op Pain Management:    Induction:   Airway Management Planned:   Additional Equipment:   Intra-op Plan:   Post-operative Plan:   Informed Consent: I have reviewed the patients History and Physical, chart, labs and discussed the procedure including the risks, benefits and alternatives for the proposed anesthesia with the patient or authorized representative who has indicated his/her understanding and acceptance.     Plan Discussed with:   Anesthesia Plan Comments:         Anesthesia Quick Evaluation

## 2014-01-23 NOTE — Progress Notes (Signed)
  Subjective: Comfortable with epidural.  Objective: BP 120/73  Pulse 70  Temp(Src) 98.2 F (36.8 C) (Oral)  Resp 18  Ht 5\' 1"  (1.549 m)  Wt 176 lb (79.833 kg)  BMI 33.27 kg/m2  SpO2 100%  LMP 04/17/2013   Total I/O In: -  Out: 800 [Urine:800]  FHT:  Cat II with occasional variables, mod variability UC:   regular, every 2-4 minutes  SVE:   Dilation: 4 Effacement (%): 100 Station: -2 Exam by:: J.Belia Febo, CNM  Assessment / Plan:  Augmentation of labor  Labor: Progressing on Pitocin, AROM at 1600;  IUPC placed, well tolerated  Preeclampsia: no signs or symptoms of toxicity  Fetal Wellbeing: Category II  Pain Control: Epidural  I/D: GBS pos; PCN; AROM; Afebrile  Anticipated MOD: NSVD   Toma Arts 01/23/2014, 4:06 PM

## 2014-01-23 NOTE — H&P (Signed)
Jessica Horton is a 23 y.o. female presenting for dec FM and ctx, mild variables noted on NST, cervix initially FT, BPP 8/8. Has some bloody show, denies LOF.    History OB History   Grav Para Term Preterm Abortions TAB SAB Ect Mult Living   1 0 0 0 0 0 0 0 0 0      Past Medical History  Diagnosis Date  . Asthma   . Asthma   . Headache(784.0)   . Migraine    Past Surgical History  Procedure Laterality Date  . Wisdom tooth extraction     Family History: family history includes Diabetes in her mother. Social History:  reports that she has never smoked. She has never used smokeless tobacco. She reports that she does not drink alcohol or use illicit drugs.   Prenatal Transfer Tool  Maternal Diabetes: No Genetic Screening: Declined Maternal Ultrasounds/Referrals: Normal Fetal Ultrasounds or other Referrals:  None Maternal Substance Abuse:  No Significant Maternal Medications:  None Significant Maternal Lab Results:  Lab values include: Group B Strep positive Other Comments:  None  ROS  Dilation: 1 Effacement (%): 80 Station: -2 Exam by:: A. Tuttle, RNC Blood pressure 93/43, pulse 72, temperature 98.2 F (36.8 C), temperature source Oral, resp. rate 16, height 5\' 1"  (1.549 Horton), weight 176 lb (79.833 kg), last menstrual period 04/17/2013. Exam Physical Exam  Prenatal labs: ABO, Rh: B/POS/-- (08/26 1405) Antibody: NEG (08/26 1405) Rubella: 3.62 (08/26 1405) RPR: NON REACTIVE (01/24 2035)  HBsAg: NEGATIVE (08/26 1405)  HIV: NON REACTIVE (08/26 1405)  GBS: Positive (09/03 0000)   Assessment/Plan: IUP at 40wks Had non-rective NST, BPP 8/8 Persistent ctx, and requesting pain meds occ mild variables noted Cervix changed from FT/50 to 1/80  Admit to b.s. Per c/w Dr Su Hiltoberts Routine L&D orders Will CTO for now, IV pain meds Plan to recheck cervix later and consider pitocin augmentation  PCN per protocol for +GBS    Jessica Horton 01/23/2014, 7:37  AM

## 2014-01-23 NOTE — Progress Notes (Signed)
  Subjective: Pt comfortable with epidural, in with friends and family visiting.  Objective: BP 103/47  Pulse 82  Temp(Src) 99.7 F (37.6 C) (Axillary)  Resp 18  Ht 5\' 1"  (1.549 m)  Wt 176 lb (79.833 kg)  BMI 33.27 kg/m2  SpO2 81%  LMP 04/17/2013 I/O last 3 completed shifts: In: -  Out: 1350 [Urine:1350]    FHT:  Cat II - position change and bolus UC:   regular, every 2-3 minutes  SVE:   Dilation: 9 Effacement (%): 100 Station: +1 Exam by:: Kattie Santoyo CNM  Assessment / Plan:  Augmentation of labor, progressing well  Labor: Progressing normally  Preeclampsia: no signs or symptoms of toxicity  Fetal Wellbeing: Category II  Pain Control: Epidural  I/D: GBS pos; PCN; AROM; Afebrile  Anticipated MOD: NSVD   Jessica Horton 01/23/2014, 7:59 PM

## 2014-01-23 NOTE — Anesthesia Procedure Notes (Signed)
Epidural Patient location during procedure: OB Start time: 01/23/2014 8:47 AM  Staffing Anesthesiologist: Brayton CavesJACKSON, Forest Redwine Performed by: anesthesiologist   Preanesthetic Checklist Completed: patient identified, site marked, surgical consent, pre-op evaluation, timeout performed, IV checked, risks and benefits discussed and monitors and equipment checked  Epidural Patient position: sitting Prep: site prepped and draped and DuraPrep Patient monitoring: continuous pulse ox and blood pressure Approach: midline Injection technique: LOR air  Needle:  Needle type: Tuohy  Needle gauge: 17 G Needle length: 9 cm and 9 Needle insertion depth: 6 cm Catheter type: closed end flexible Catheter size: 19 Gauge Catheter at skin depth: 12 cm Test dose: negative  Assessment Events: blood not aspirated, injection not painful, no injection resistance, negative IV test and no paresthesia  Additional Notes Patient identified.  Risk benefits discussed including failed block, incomplete pain control, headache, nerve damage, paralysis, blood pressure changes, nausea, vomiting, reactions to medication both toxic or allergic, and postpartum back pain.  Patient expressed understanding and wished to proceed.  All questions were answered.  Sterile technique used throughout procedure and epidural site dressed with sterile barrier dressing. No paresthesia or other complications noted.The patient did not experience any signs of intravascular injection such as tinnitus or metallic taste in mouth nor signs of intrathecal spread such as rapid motor block. Please see nursing notes for vital signs.

## 2014-01-24 ENCOUNTER — Encounter (HOSPITAL_COMMUNITY): Payer: Self-pay | Admitting: Certified Registered"

## 2014-01-24 ENCOUNTER — Encounter (HOSPITAL_COMMUNITY)
Admission: AD | Disposition: A | Discharge status: Home / Self Care | Payer: Self-pay | Source: Ambulatory Visit | Attending: Obstetrics and Gynecology

## 2014-01-24 SURGERY — Surgical Case
Anesthesia: Epidural | Site: Abdomen

## 2014-01-24 MED ORDER — MEPERIDINE HCL 25 MG/ML IJ SOLN: 6.2500 mg | INTRAMUSCULAR | Status: DC | PRN

## 2014-01-24 MED ORDER — MEASLES, MUMPS & RUBELLA VAC ~~LOC~~ INJ: 0.5000 mL | INJECTION | Freq: Once | SUBCUTANEOUS | Status: DC

## 2014-01-24 MED ORDER — BUPIVACAINE HCL (PF) 0.25 % IJ SOLN
INTRAMUSCULAR | Status: DC | PRN
  Administered 2014-01-24: 20 mL

## 2014-01-24 MED ORDER — OXYTOCIN 40 UNITS IN LACTATED RINGERS INFUSION - SIMPLE MED
1.0000 m[IU]/min | INTRAVENOUS | Status: DC
Start: 2014-01-24 — End: 2014-01-24

## 2014-01-24 MED ORDER — CEFAZOLIN SODIUM-DEXTROSE 2-3 GM-% IV SOLR
INTRAVENOUS | Status: AC
  Filled 2014-01-24: qty 50

## 2014-01-24 MED ORDER — MISOPROSTOL 200 MCG PO TABS
ORAL_TABLET | ORAL | Status: AC
  Filled 2014-01-24: qty 5

## 2014-01-24 MED ORDER — KETOROLAC TROMETHAMINE 30 MG/ML IJ SOLN: 30.0000 mg | Freq: Four times a day (QID) | INTRAMUSCULAR | Status: AC | PRN

## 2014-01-24 MED ORDER — DIBUCAINE 1 % RE OINT: 1.0000 "application " | TOPICAL_OINTMENT | RECTAL | Status: DC | PRN

## 2014-01-24 MED ORDER — ONDANSETRON HCL 4 MG PO TABS: 4.0000 mg | ORAL_TABLET | ORAL | Status: DC | PRN

## 2014-01-24 MED ORDER — OXYTOCIN 10 UNIT/ML IJ SOLN
INTRAMUSCULAR | Status: AC
  Filled 2014-01-24: qty 4

## 2014-01-24 MED ORDER — METHYLERGONOVINE MALEATE 0.2 MG PO TABS: 0.2000 mg | ORAL_TABLET | ORAL | Status: DC | PRN

## 2014-01-24 MED ORDER — TETANUS-DIPHTH-ACELL PERTUSSIS 5-2.5-18.5 LF-MCG/0.5 IM SUSP: 0.5000 mL | Freq: Once | INTRAMUSCULAR | Status: DC

## 2014-01-24 MED ORDER — CEFAZOLIN SODIUM-DEXTROSE 2-3 GM-% IV SOLR
INTRAVENOUS | Status: DC | PRN
  Administered 2014-01-24: 2 g via INTRAVENOUS

## 2014-01-24 MED ORDER — PHENYLEPHRINE 8 MG IN D5W 100 ML (0.08MG/ML) PREMIX OPTIME
INJECTION | INTRAVENOUS | Status: AC
  Filled 2014-01-24: qty 100

## 2014-01-24 MED ORDER — DIPHENHYDRAMINE HCL 50 MG/ML IJ SOLN: 12.5000 mg | INTRAMUSCULAR | Status: DC | PRN

## 2014-01-24 MED ORDER — ONDANSETRON HCL 4 MG/2ML IJ SOLN
INTRAMUSCULAR | Status: AC
  Filled 2014-01-24: qty 2

## 2014-01-24 MED ORDER — DIPHENHYDRAMINE HCL 25 MG PO CAPS: 25.0000 mg | ORAL_CAPSULE | Freq: Four times a day (QID) | ORAL | Status: DC | PRN

## 2014-01-24 MED ORDER — ONDANSETRON HCL 4 MG/2ML IJ SOLN
INTRAMUSCULAR | Status: DC | PRN
  Administered 2014-01-24: 4 mg via INTRAVENOUS

## 2014-01-24 MED ORDER — HYDROMORPHONE HCL PF 1 MG/ML IJ SOLN
0.2500 mg | INTRAMUSCULAR | Status: DC | PRN
  Administered 2014-01-24 (×2): 0.5 mg via INTRAVENOUS

## 2014-01-24 MED ORDER — NALBUPHINE HCL 10 MG/ML IJ SOLN: 5.0000 mg | INTRAMUSCULAR | Status: DC | PRN

## 2014-01-24 MED ORDER — LIDOCAINE-EPINEPHRINE (PF) 2 %-1:200000 IJ SOLN
INTRAMUSCULAR | Status: AC
  Filled 2014-01-24: qty 20

## 2014-01-24 MED ORDER — DIPHENHYDRAMINE HCL 25 MG PO CAPS: 25.0000 mg | ORAL_CAPSULE | ORAL | Status: DC | PRN

## 2014-01-24 MED ORDER — ZOLPIDEM TARTRATE 5 MG PO TABS: 5.0000 mg | ORAL_TABLET | Freq: Every evening | ORAL | Status: DC | PRN

## 2014-01-24 MED ORDER — SCOPOLAMINE 1 MG/3DAYS TD PT72
MEDICATED_PATCH | TRANSDERMAL | Status: AC
Start: 2014-01-24 — End: 2014-01-24
  Filled 2014-01-24: qty 1

## 2014-01-24 MED ORDER — METOCLOPRAMIDE HCL 5 MG/ML IJ SOLN: 10.0000 mg | Freq: Three times a day (TID) | INTRAMUSCULAR | Status: DC | PRN

## 2014-01-24 MED ORDER — IBUPROFEN 600 MG PO TABS
600.0000 mg | ORAL_TABLET | Freq: Four times a day (QID) | ORAL | Status: DC
  Administered 2014-01-24 – 2014-01-27 (×9): 600 mg via ORAL
  Filled 2014-01-24 (×10): qty 1

## 2014-01-24 MED ORDER — PRENATAL MULTIVITAMIN CH
1.0000 | ORAL_TABLET | Freq: Every day | ORAL | Status: DC
  Administered 2014-01-25 – 2014-01-26 (×2): 1 via ORAL
  Filled 2014-01-24 (×2): qty 1

## 2014-01-24 MED ORDER — HYDROMORPHONE HCL PF 1 MG/ML IJ SOLN
INTRAMUSCULAR | Status: AC
  Administered 2014-01-24: 0.5 mg via INTRAVENOUS
  Filled 2014-01-24: qty 1

## 2014-01-24 MED ORDER — SENNOSIDES-DOCUSATE SODIUM 8.6-50 MG PO TABS
2.0000 | ORAL_TABLET | ORAL | Status: DC
  Administered 2014-01-24 – 2014-01-27 (×3): 2 via ORAL
  Filled 2014-01-24 (×3): qty 2

## 2014-01-24 MED ORDER — LACTATED RINGERS IV SOLN
INTRAVENOUS | Status: DC | PRN
  Administered 2014-01-24: 07:00:00 via INTRAVENOUS

## 2014-01-24 MED ORDER — KETOROLAC TROMETHAMINE 30 MG/ML IJ SOLN
INTRAMUSCULAR | Status: AC
Start: 2014-01-24 — End: 2014-01-24
  Administered 2014-01-24: 30 mg
  Filled 2014-01-24: qty 1

## 2014-01-24 MED ORDER — MORPHINE SULFATE (PF) 0.5 MG/ML IJ SOLN
INTRAMUSCULAR | Status: DC | PRN
Start: 2014-01-24 — End: 2014-01-24
  Administered 2014-01-24: 4 mg via EPIDURAL
  Administered 2014-01-24: 1 mg via INTRAVENOUS

## 2014-01-24 MED ORDER — SODIUM CHLORIDE 0.9 % IJ SOLN: 3.0000 mL | INTRAMUSCULAR | Status: DC | PRN

## 2014-01-24 MED ORDER — SIMETHICONE 80 MG PO CHEW
80.0000 mg | CHEWABLE_TABLET | ORAL | Status: DC
  Administered 2014-01-24 – 2014-01-27 (×2): 80 mg via ORAL
  Filled 2014-01-24 (×3): qty 1

## 2014-01-24 MED ORDER — NALOXONE HCL 0.4 MG/ML IJ SOLN: 0.4000 mg | INTRAMUSCULAR | Status: DC | PRN

## 2014-01-24 MED ORDER — LANOLIN HYDROUS EX OINT: 1.0000 "application " | TOPICAL_OINTMENT | CUTANEOUS | Status: DC | PRN

## 2014-01-24 MED ORDER — ONDANSETRON HCL 4 MG/2ML IJ SOLN: 4.0000 mg | Freq: Three times a day (TID) | INTRAMUSCULAR | Status: DC | PRN

## 2014-01-24 MED ORDER — LACTATED RINGERS IV SOLN
INTRAVENOUS | Status: DC | PRN
  Administered 2014-01-24: 08:00:00 via INTRAVENOUS

## 2014-01-24 MED ORDER — MENTHOL 3 MG MT LOZG: 1.0000 | LOZENGE | OROMUCOSAL | Status: DC | PRN

## 2014-01-24 MED ORDER — OXYTOCIN 40 UNITS IN LACTATED RINGERS INFUSION - SIMPLE MED: 62.5000 mL/h | INTRAVENOUS | Status: AC

## 2014-01-24 MED ORDER — MORPHINE SULFATE 0.5 MG/ML IJ SOLN
INTRAMUSCULAR | Status: AC
  Filled 2014-01-24: qty 10

## 2014-01-24 MED ORDER — ALBUTEROL SULFATE (2.5 MG/3ML) 0.083% IN NEBU
3.0000 mL | INHALATION_SOLUTION | Freq: Four times a day (QID) | RESPIRATORY_TRACT | Status: DC | PRN
  Filled 2014-01-24: qty 3

## 2014-01-24 MED ORDER — LACTATED RINGERS IV SOLN
INTRAVENOUS | Status: DC
  Administered 2014-01-24: 1000 mL via INTRAVENOUS

## 2014-01-24 MED ORDER — LACTATED RINGERS IV SOLN
INTRAVENOUS | Status: DC
  Administered 2014-01-24: 250 mL via INTRAUTERINE

## 2014-01-24 MED ORDER — SCOPOLAMINE 1 MG/3DAYS TD PT72
1.0000 | MEDICATED_PATCH | Freq: Once | TRANSDERMAL | Status: AC
  Administered 2014-01-24: 1.5 mg via TRANSDERMAL

## 2014-01-24 MED ORDER — FERROUS SULFATE 325 (65 FE) MG PO TABS
325.0000 mg | ORAL_TABLET | Freq: Two times a day (BID) | ORAL | Status: DC
  Administered 2014-01-25 – 2014-01-27 (×5): 325 mg via ORAL
  Filled 2014-01-24 (×5): qty 1

## 2014-01-24 MED ORDER — SODIUM BICARBONATE 8.4 % IV SOLN
INTRAVENOUS | Status: AC
  Filled 2014-01-24: qty 50

## 2014-01-24 MED ORDER — BUPIVACAINE HCL (PF) 0.25 % IJ SOLN
INTRAMUSCULAR | Status: AC
  Filled 2014-01-24: qty 30

## 2014-01-24 MED ORDER — SIMETHICONE 80 MG PO CHEW
80.0000 mg | CHEWABLE_TABLET | Freq: Three times a day (TID) | ORAL | Status: DC
  Administered 2014-01-25 – 2014-01-27 (×6): 80 mg via ORAL
  Filled 2014-01-24 (×7): qty 1

## 2014-01-24 MED ORDER — FENTANYL CITRATE 0.05 MG/ML IJ SOLN
INTRAMUSCULAR | Status: DC | PRN
  Administered 2014-01-24 (×2): 50 ug via INTRAVENOUS

## 2014-01-24 MED ORDER — FLUTICASONE PROPIONATE HFA 110 MCG/ACT IN AERO
2.0000 | INHALATION_SPRAY | Freq: Two times a day (BID) | RESPIRATORY_TRACT | Status: DC
  Filled 2014-01-24: qty 12

## 2014-01-24 MED ORDER — NALOXONE HCL 1 MG/ML IJ SOLN
1.0000 ug/kg/h | INTRAVENOUS | Status: DC | PRN
  Filled 2014-01-24: qty 2

## 2014-01-24 MED ORDER — OXYTOCIN 10 UNIT/ML IJ SOLN
40.0000 [IU] | INTRAVENOUS | Status: DC | PRN
  Administered 2014-01-24: 40 [IU] via INTRAVENOUS

## 2014-01-24 MED ORDER — METHYLERGONOVINE MALEATE 0.2 MG/ML IJ SOLN: 0.2000 mg | INTRAMUSCULAR | Status: DC | PRN

## 2014-01-24 MED ORDER — OXYCODONE-ACETAMINOPHEN 5-325 MG PO TABS
1.0000 | ORAL_TABLET | ORAL | Status: DC | PRN
  Administered 2014-01-24: 1 via ORAL
  Administered 2014-01-25: 2 via ORAL
  Administered 2014-01-25 (×2): 1 via ORAL
  Administered 2014-01-25 – 2014-01-26 (×3): 2 via ORAL
  Administered 2014-01-26: 1 via ORAL
  Administered 2014-01-27: 2 via ORAL
  Filled 2014-01-24 (×3): qty 2
  Filled 2014-01-24: qty 1
  Filled 2014-01-24: qty 2
  Filled 2014-01-24: qty 1
  Filled 2014-01-24: qty 2
  Filled 2014-01-24 (×2): qty 1

## 2014-01-24 MED ORDER — SIMETHICONE 80 MG PO CHEW
80.0000 mg | CHEWABLE_TABLET | ORAL | Status: DC | PRN
  Administered 2014-01-26: 80 mg via ORAL

## 2014-01-24 MED ORDER — SODIUM BICARBONATE 8.4 % IV SOLN
INTRAVENOUS | Status: DC | PRN
  Administered 2014-01-24 (×2): 5 mL via EPIDURAL
  Administered 2014-01-24: 2 mL via EPIDURAL
  Administered 2014-01-24: 8 mL via EPIDURAL

## 2014-01-24 MED ORDER — FENTANYL CITRATE 0.05 MG/ML IJ SOLN
INTRAMUSCULAR | Status: AC
  Filled 2014-01-24: qty 2

## 2014-01-24 MED ORDER — ONDANSETRON HCL 4 MG/2ML IJ SOLN: 4.0000 mg | INTRAMUSCULAR | Status: DC | PRN

## 2014-01-24 MED ORDER — DIPHENHYDRAMINE HCL 50 MG/ML IJ SOLN: 25.0000 mg | INTRAMUSCULAR | Status: DC | PRN

## 2014-01-24 MED ORDER — WITCH HAZEL-GLYCERIN EX PADS: 1.0000 "application " | MEDICATED_PAD | CUTANEOUS | Status: DC | PRN

## 2014-01-24 SURGICAL SUPPLY — 36 items
BENZOIN TINCTURE PRP APPL 2/3 (GAUZE/BANDAGES/DRESSINGS) ×3 IMPLANT
BOOTIES KNEE HIGH SLOAN (MISCELLANEOUS) ×6 IMPLANT
CLAMP CORD UMBIL (MISCELLANEOUS) IMPLANT
CLOSURE WOUND 1/2 X4 (GAUZE/BANDAGES/DRESSINGS) ×1
CLOTH BEACON ORANGE TIMEOUT ST (SAFETY) ×3 IMPLANT
DRAIN JACKSON PRT FLT 10 (DRAIN) IMPLANT
DRAPE LG THREE QUARTER DISP (DRAPES) IMPLANT
DRSG OPSITE POSTOP 4X10 (GAUZE/BANDAGES/DRESSINGS) ×3 IMPLANT
DURAPREP 26ML APPLICATOR (WOUND CARE) ×3 IMPLANT
ELECT REM PT RETURN 9FT ADLT (ELECTROSURGICAL) ×3
ELECTRODE REM PT RTRN 9FT ADLT (ELECTROSURGICAL) ×1 IMPLANT
EVACUATOR SILICONE 100CC (DRAIN) IMPLANT
EXTRACTOR VACUUM M CUP 4 TUBE (SUCTIONS) IMPLANT
EXTRACTOR VACUUM M CUP 4' TUBE (SUCTIONS)
GLOVE BIOGEL PI IND STRL 7.0 (GLOVE) ×1 IMPLANT
GLOVE BIOGEL PI INDICATOR 7.0 (GLOVE) ×2
GLOVE ECLIPSE 6.5 STRL STRAW (GLOVE) ×3 IMPLANT
GOWN STRL REUS W/TWL LRG LVL3 (GOWN DISPOSABLE) ×6 IMPLANT
KIT ABG SYR 3ML LUER SLIP (SYRINGE) IMPLANT
NEEDLE HYPO 22GX1.5 SAFETY (NEEDLE) ×3 IMPLANT
NEEDLE HYPO 25X5/8 SAFETYGLIDE (NEEDLE) IMPLANT
NS IRRIG 1000ML POUR BTL (IV SOLUTION) ×3 IMPLANT
PACK C SECTION WH (CUSTOM PROCEDURE TRAY) ×3 IMPLANT
PAD OB MATERNITY 4.3X12.25 (PERSONAL CARE ITEMS) ×3 IMPLANT
RTRCTR C-SECT PINK 25CM LRG (MISCELLANEOUS) ×3 IMPLANT
STRIP CLOSURE SKIN 1/2X4 (GAUZE/BANDAGES/DRESSINGS) ×2 IMPLANT
SUT CHROMIC GUT AB #0 18 (SUTURE) IMPLANT
SUT MNCRL AB 3-0 PS2 27 (SUTURE) ×3 IMPLANT
SUT SILK 2 0 FSL 18 (SUTURE) IMPLANT
SUT VIC AB 0 CTX 36 (SUTURE) ×4
SUT VIC AB 0 CTX36XBRD ANBCTRL (SUTURE) ×2 IMPLANT
SUT VIC AB 1 CT1 36 (SUTURE) ×6 IMPLANT
SYR 20CC LL (SYRINGE) ×3 IMPLANT
TOWEL OR 17X24 6PK STRL BLUE (TOWEL DISPOSABLE) ×6 IMPLANT
TRAY FOLEY CATH 14FR (SET/KITS/TRAYS/PACK) IMPLANT
WATER STERILE IRR 1000ML POUR (IV SOLUTION) IMPLANT

## 2014-01-24 NOTE — Progress Notes (Signed)
  Subjective: Pt has been pushing for 2 hours.  Pt appears to be exhausted but maternal effort is good.    Objective: BP 103/71  Pulse 63  Temp(Src) 98.5 F (36.9 C) (Axillary)  Resp 18  Ht 5\' 1"  (1.549 m)  Wt 176 lb (79.833 kg)  BMI 33.27 kg/m2  SpO2 81%  LMP 04/17/2013 I/O last 3 completed shifts: In: -  Out: 1350 [Urine:1350]    FHT:  Cat II UC:   regular, every 3-4.5 minutes  SVE:   Dilation: 10 Effacement (%): 100 Station: +2 Exam by:: Tamre Cass CNM  Assessment / Plan:  Arrest of decent; Pitocin turned off Labor: C/w Dr. Su Hiltoberts - pt requests c/s  Preeclampsia: no signs or symptoms of toxicity  Fetal Wellbeing: Category II  Pain Control: Epidural  I/D: GBS pos; PCN; AROM x 15 hours; Afebrile  Anticipated MOD: Will go to OR for c/s   Jessica Horton 01/24/2014, 6:59 AM

## 2014-01-24 NOTE — Progress Notes (Signed)
  Subjective: Visiting with friends and family.  Objective: BP 118/61  Pulse 65  Temp(Src) 99.7 F (37.6 C) (Axillary)  Resp 18  Ht 5\' 1"  (1.549 m)  Wt 176 lb (79.833 kg)  BMI 33.27 kg/m2  SpO2 81%  LMP 04/17/2013 I/O last 3 completed shifts: In: -  Out: 1350 [Urine:1350]    FHT:  Cat II - Position change, amnioinfusion UC:   regular, every 2-4 minutes  SVE:   Dilation: Lip/rim Effacement (%): 100 Station: +2 Exam by:: Paramedicarmer RN  Assessment / Plan:  Augmentation of labor, progressing slowly, Pit at 12 miliU, MVUs at 125  Labor: Slow progress; C/w Dr. Su Hiltoberts - start amnioinfusion Preeclampsia: no signs or symptoms of toxicity  Fetal Wellbeing: Category II  Pain Control: Epidural  I/D: GBS pos; PCN; AROM; Afebrile  Anticipated MOD: NSVD   Meigan Pates 01/24/2014, 3:11 AM

## 2014-01-24 NOTE — Lactation Note (Signed)
This note was copied from the chart of Jessica Brooke DareFadoua Wahbi RemingtonEl Alaoui. Lactation Consultation Note  Patient Name: Jessica Horton Reason for consult: Initial assessment BF basics reviewed with Mom. Baby asleep at this visit, but Mom reports baby nursed well in PACU. Encouraged to BF with feeding ques, at least every 3 hours. Mom declined to BF at this visit but reports if baby does not wake in the next hour she will place baby STS and attempt to BF. Mom is considering breast and bottle feeding. Encouraged Mom to keep baby at the breast, but if she decides to supplement, follow guidelines per hand out given. Reviewed importance of frequent breastfeeding to milk production, prevent engorgement and protecting milk supply. Lactation brochure left for review, advised of OP services and support group. Encouraged to call for assist with latching baby.   Maternal Data Formula Feeding for Exclusion: Yes Reason for exclusion: Mother's choice to formula and breast feed on admission Infant to breast within first hour of birth: No Breastfeeding delayed due to:: Maternal status Has patient been taught Hand Expression?: Yes Does the patient have breastfeeding experience prior to this delivery?: No  Feeding    LATCH Score/Interventions                      Lactation Tools Discussed/Used WIC Program: Yes   Consult Status Consult Status: Follow-up Date: 01/25/14 Follow-up type: In-patient    Alfred LevinsGranger, Shanyla Marconi Ann Horton, 2:04 PM

## 2014-01-24 NOTE — Anesthesia Postprocedure Evaluation (Signed)
  Anesthesia Post-op Note  Patient: Jessica Horton  Procedure(s) Performed: Procedure(s): CESAREAN SECTION (N/A)  Patient is awake, responsive, moving her legs, and has signs of resolution of her numbness. Pain and nausea are reasonably well controlled. Vital signs are stable and clinically acceptable. Oxygen saturation is clinically acceptable. There are no apparent anesthetic complications at this time. Patient is ready for discharge.

## 2014-01-24 NOTE — Consult Note (Signed)
Neonatology Note:   Attendance at C-section:    I was asked by Dr. Estanislado Pandyivard to attend this primary C/S at term due to failure of descent. The mother is a G1P0 B pos, GBS pos with late PNC and migraines. She received Pen G > 4 hours prior to delivery. Her highest temperature during labor was 100.1 degrees. ROM 16 hours prior to delivery, fluid clear. Infant vigorous with good spontaneous cry and tone. Needed only minimal bulb suctioning. Ap 9/9. Lungs clear to ausc in DR. To CN to care of Pediatrician.   Doretha Souhristie C. Aquil Duhe, MD

## 2014-01-24 NOTE — Lactation Note (Signed)
This note was copied from the chart of Jessica Brooke DareFadoua Wahbi DuarteEl Alaoui. Lactation Consultation Note  Patient Name: Jessica Horton Reason for consult: Follow-up assessment Mom called for assist with latching baby. Baby awake, assisted Mom with positioning baby in football hold. Made few attempts to latch, however baby gaggy and would not latch. Placed baby STS and advised Mom to ask for assist with next feeding.   Maternal Data Formula Feeding for Exclusion: Yes Reason for exclusion: Mother's choice to formula and breast feed on admission Infant to breast within first hour of birth: No Breastfeeding delayed due to:: Maternal status Has patient been taught Hand Expression?: Yes Does the patient have breastfeeding experience prior to this delivery?: No  Feeding Feeding Type: Breast Fed Length of feed: 0 min  LATCH Score/Interventions Latch: Too sleepy or reluctant, no latch achieved, no sucking elicited. Intervention(s): Adjust position;Assist with latch;Breast massage;Breast compression  Audible Swallowing: None  Type of Nipple: Flat  Comfort (Breast/Nipple): Soft / non-tender     Hold (Positioning): Assistance needed to correctly position infant at breast and maintain latch. Intervention(s): Breastfeeding basics reviewed;Support Pillows;Position options;Skin to skin  LATCH Score: 4  Lactation Tools Discussed/Used Tools: Pump Breast pump type: Manual WIC Program: Yes   Consult Status Consult Status: Follow-up Date: 01/25/14 Follow-up type: In-patient    Jessica Horton, Jessica Horton Horton, 2:27 PM

## 2014-01-24 NOTE — Op Note (Signed)
Preoperative diagnosis: Intrauterine pregnancy at 40 weeks and 2 days with failure to progress  Post operative diagnosis: Same  Anesthesia: Epidural  Anesthesiologist: Dr. Cristela BlueKyle Jackson  Procedure: Primary low transverse cesarean section  Surgeon: Dr. Dois DavenportSandra Hartford Maulden  Assistant: none  Estimated blood loss: 500 cc  Procedure:  After being informed of the planned procedure and possible complications including bleeding, infection, injury to other organs, informed consent is obtained. The patient is taken to OR #9 and pre-existing epidura anesthesia was optimized without complication. She is placed in the dorsal decubitus position with the pelvis tilted to the left. She is then prepped and draped in a sterile fashion. A Foley catheter is inserted in her bladder.  After assessing adequate level of anesthesia, we infiltrate the suprapubic area with 20 cc of Marcaine 0.25 and perform a Pfannenstiel incision which is brought down sharply to the fascia. The fascia is entered in a low transverse fashion. Linea alba is dissected. Peritoneum is entered in a midline fashion. An Alexis retractor is easily positioned.   The myometrium is then entered in a low transverse fashion, 2 cm above the vesico-uterine junction ; first with knife and then extended bluntly. Amniotic fluid is clear. We assist the birth of a female  infant in vertex presentation. Mouth and nose are suctioned. The baby is delivered. 1 nuchal cord is reduced and the cord is clamped and sectioned. The baby is given to the neonatologist present in the room.  10 cc of blood is drawn from the umbilical vein.The placenta is allowed to deliver spontaneously. It is complete and the cord has 3 vessels. Uterine revision is negative.  We proceed with closure of the myometrium in 2 layers: First with a running locked suture of 0 Vicryl, then with a Lembert suture of 0 Vicryl imbricating the first one. Hemostasis is completed with cauterization on  peritoneal edges.  Both paracolic gutters are cleaned. Both tubes and ovaries are assessed and normal. The pelvis is profusely irrigated with warm saline to confirm a satisfactory hemostasis.  Retractors and sponges are removed. Under fascia hemostasis is completed with cauterization. The fascia is then closed with 2 running sutures of 0 Vicryl meeting midline. The wound is irrigated with warm saline and hemostasis is completed with cauterization. The skin is closed with a subcuticular suture of 3-0 Monocryl and Steri-Strips.  Instrument and sponge count is complete x2. Estimated blood loss is 500 cc.  The procedure is well tolerated by the patient who is taken to recovery room in a well and stable condition.  female baby named Gretta BeganJed was born at 8:15 and received an Apgar of 8  at 1 minute and 9 at 5 minutes.    Specimen: Placenta sent to L & D   Lynkin Saini A MD 1/26/20158:54 AM

## 2014-01-24 NOTE — Progress Notes (Signed)
  Subjective: Pt had lights off, friends and family left so pt could sleep.  Objective: BP 105/54  Pulse 73  Temp(Src) 99.7 F (37.6 C) (Axillary)  Resp 18  Ht 5\' 1"  (1.549 m)  Wt 176 lb (79.833 kg)  BMI 33.27 kg/m2  SpO2 81%  LMP 04/17/2013 I/O last 3 completed shifts: In: -  Out: 1350 [Urine:1350]    FHT:  Cat II UC:   regular, every 2-4 minutes  SVE:   Dilation: Lip/rim Effacement (%): 100 Station: +2 Exam by:: Jaidence Geisler CNM  Assessment / Plan:  Augmentation of labor, progressing slowly, Pit at 11 miliU, MVUs at 125  Labor: Slow progress Preeclampsia: no signs or symptoms of toxicity  Fetal Wellbeing: Category II  Pain Control: Epidural  I/D: GBS pos; PCN; AROM; Afebrile  Anticipated MOD: NSVD   Holden Draughon 01/24/2014, 2:35 AM

## 2014-01-24 NOTE — Lactation Note (Signed)
This note was copied from the chart of Jessica Horton. Lactation Consultation Note  Patient Name: Jessica Horton Jessica Horton: 01/24/2014 Reason for consult: Follow-up assessment;Difficult latch and baby now 13 hours of age.  LC recommended nursing in football position since baby has been spitty.  Baby has eyes open and early feeding cues noted. LC assisted in positioning baby on (R) breast and baby able to latch with mom supporting breast and LC performing "tea cup" breast support.  Baby grasps areola and sucks rhythmically with a few swallows noted and when he slips off, able to re-latch quickly.  Mom denies nipple discomfort although her nipples are short.  LC encouraged cue feedings and demonstrated hand expression which encouraged baby to open mouth for latch.  Total latch time was about 10 minutes and baby slipped off and fell asleep. Feeding assessment reported to RN, Jessica Horton.   Maternal Data    Feeding Feeding Type: Breast Fed Length of feed: 10 min (several re-latching attempts)  LATCH Score/Interventions Latch: Repeated attempts needed to sustain latch, nipple held in mouth throughout feeding, stimulation needed to elicit sucking reflex. Intervention(s): Skin to skin;Teach feeding cues;Waking techniques Intervention(s): Assist with latch;Breast compression  Audible Swallowing: A few with stimulation Intervention(s): Skin to skin;Hand expression Intervention(s): Skin to skin;Hand expression;Alternate breast massage  Type of Nipple: Everted at rest and after stimulation (nipples short but breasts are compressible)  Comfort (Breast/Nipple): Soft / non-tender     Hold (Positioning): Assistance needed to correctly position infant at breast and maintain latch. Intervention(s): Breastfeeding basics reviewed;Support Pillows;Position options;Skin to skin (encouraged upright football position if baby acting spitty)  LATCH Score: 7  Lactation Tools  Discussed/Used   STS, signs of proper latch, swallows (mom able to hear), latch techniques  Consult Status Consult Status: Follow-up Horton: 01/25/14 Follow-up type: In-patient    Jessica ParisianBryant, Jessica [facility]orton [facility] 01/24/2014, 9:38 PM

## 2014-01-24 NOTE — Progress Notes (Signed)
  Subjective: [facility] is visiting.  [facility] seeing pt for SVE.  Objective: BP 108/51  Pulse 72  Temp(Src) 99.2 F (37.3 C) (Axillary)  Resp 18  Ht 5\' 1"  (1.549 m)  Wt 176 lb (79.833 kg)  BMI 33.27 kg/m2  SpO2 81%  LMP 04/17/2013 I/O last 3 completed shifts: In: -  Out: 1350 [Urine:1350]    FHT:  Cat II resolving to Cat I with bolus, position change, and O2 UC:   regular, every 2-3.5 minutes  SVE:   Dilation: Lip/rim Effacement (%): 100 Station: +1 Exam by:: Jancarlos Thrun CNM  Assessment / Plan:  Augmentation of labor, progressing slowly, Pit at 9 miliU, MVUs at 170 Labor: Slow progress, c/w Dr. Su Hiltoberts - increase pitocin by 1 miliU instead of 2 miliU, report if late variables return, call when pt is complete and she begins to push  Preeclampsia: no signs or symptoms of toxicity  Fetal Wellbeing: Category II  Pain Control: Epidural  I/D: GBS pos; PCN; AROM; Afebrile   Anticipated MOD: NSVD   Sherlon Nied 01/24/2014, 12:48 AM

## 2014-01-24 NOTE — Preoperative (Signed)
Beta Blockers   Reason not to administer Beta Blockers:Not Applicable 

## 2014-01-24 NOTE — Progress Notes (Signed)
Called by Dr Su Hiltoberts to proceed with C/S for failure to progress Ay my arrival, patient on stretcher and tearful surrounded by supporting family   Cesarean section reviewed with pt with R&B including but not limited to: bleeding, infection, injury to other organs. Low transverse approach planned which will allow vaginal delivery with future pregnancies. Should a vertical incision or inverted T be needed, patient is aware that repeat cesarean sections would be recommended in the future. Expected hospital stay and recovery also discussed.  FHR reassuring Will proceed with cesarean section

## 2014-01-24 NOTE — Transfer of Care (Signed)
Immediate Anesthesia Transfer of Care Note  Patient: Jessica Horton  Procedure(s) Performed: Procedure(s): CESAREAN SECTION (N/A)  Patient Location: PACU  Anesthesia Type:Epidural  Level of Consciousness: awake, alert  and oriented  Airway & Oxygen Therapy: Patient Spontanous Breathing  Post-op Assessment: Report given to PACU RN, Post -op Vital signs reviewed and stable and Patient moving all extremities  Post vital signs: Reviewed and stable  Complications: No apparent anesthesia complications

## 2014-01-25 ENCOUNTER — Encounter (HOSPITAL_COMMUNITY): Payer: Self-pay | Admitting: Obstetrics and Gynecology

## 2014-01-25 LAB — CBC
HCT: 29.2 % — ABNORMAL LOW (ref 36.0–46.0)
Hemoglobin: 9.8 g/dL — ABNORMAL LOW (ref 12.0–15.0)
MCH: 28.8 pg (ref 26.0–34.0)
MCHC: 33.6 g/dL (ref 30.0–36.0)
MCV: 85.9 fL (ref 78.0–100.0)
Platelets: 141 10*3/uL — ABNORMAL LOW (ref 150–400)
RBC: 3.4 MIL/uL — ABNORMAL LOW (ref 3.87–5.11)
RDW: 13.4 % (ref 11.5–15.5)
WBC: 12.7 10*3/uL — ABNORMAL HIGH (ref 4.0–10.5)

## 2014-01-25 NOTE — Anesthesia Postprocedure Evaluation (Signed)
  Anesthesia Post-op Note  Anesthesia Post Note  Patient: Jessica Horton  Procedure(s) Performed: Procedure(s) (LRB): CESAREAN SECTION (N/A)  Anesthesia type: Epidural  Patient location: Mother/Baby  Post pain: Pain level controlled  Post assessment: Post-op Vital signs reviewed  Last Vitals:  Filed Vitals:   01/25/14 0630  BP: 92/58  Pulse: 64  Temp: 36.8 C  Resp: 16    Post vital signs: Reviewed  Level of consciousness:alert  Complications: No apparent anesthesia complications

## 2014-01-25 NOTE — Progress Notes (Signed)
Subjective: Postpartum Day 1: Cesarean Delivery due to FTD Patient has ambulated to BR, no syncope or dizziness. Feeding:  Breast--some latch issues, but LCs involved. Contraceptive plan:  Undecided  Objective: Vital signs in last 24 hours: Temp:  [98.3 F (36.8 C)-99.7 F (37.6 C)] 98.3 F (36.8 C) (01/27 0630) Pulse Rate:  [59-93] 64 (01/27 0630) Resp:  [16-20] 16 (01/27 0630) BP: (89-126)/(56-77) 92/58 mmHg (01/27 0630) SpO2:  [92 %-100 %] 94 % (01/27 0630)  Physical Exam:  General: Very drowsy--awakened from sleep for exam Lochia: appropriate Uterine Fundus: firm Perineum--edematous, but WNL Incision: Pressure dressing CDI DVT Evaluation: No evidence of DVT seen on physical exam. Negative Homan's sign.   JP drain:   NA Foley removed last evening--has voided several times during night   Recent Labs  01/22/14 2035 01/25/14 0600  HGB 12.3 9.8*  HCT 36.4 29.2*  Orthostatics stable.  Assessment/Plan: Status post Cesarean section day 1. Doing well postoperatively.  Anemia without hemodynamic instability Continue current care. Working with LCs on breastfeeding.    Nigel BridgemanLATHAM, Tocarra Gassen 01/25/2014, 7:41 AM

## 2014-01-25 NOTE — Lactation Note (Signed)
This note was copied from the chart of Jessica Horton. Lactation Consultation Note  Patient Name: Jessica Brooke DareFadoua Wahbi La WardEl Horton ZOXWR'UToday's Date: 01/25/2014 Reason for consult: Follow-up assessment Baby 30 hours old. Mom reports breastfeeding going "all right." Offered to assist with latch, mom didn't feel baby ready because sleeping. Enc mom to off STS. Baby dressing in two layers, 2 outfits. Baby showing early feeding cues. Enc mom to undress baby to nurse, mom would only allow 1 layer to be removed. Baby sleepy at first attempt to nurse. Demonstrated waking techniques. Demonstrated how to hand express. Mom not really interested in hand expression. Attempted twice, but the moved to attempting to latch baby. Assessed baby's suck reflex with gloved hand. Baby chewed and clamped down on finger at first. After suck training for a minute, baby began sucking. Enc mom to move to the bed to attempt football hold. Enc mom to position baby's nose to nipple. Mom reports this is more comfortable. Enc mom to massage and hand express prior to latching baby on, and to post pump with manual pump followed up by hand expression after each nursing attempt. Attempted to latch baby several times. Baby interested and trying to stay latched, but mom kept letting go of breast. Enc mom to maintain breast compression for baby. Discussed with mom the need to stimulate breasts by latching baby and using hands and pump. Enc cue based feeding and STS. Enc to call out for assistance with latching as needed.   Maternal Data    Feeding Feeding Type: Breast Fed Length of feed:  (Still attempting to sustain a latch when LC left patient.)  LATCH Score/Interventions Latch: Too sleepy or reluctant, no latch achieved, no sucking elicited. Intervention(s): Skin to skin;Teach feeding cues;Waking techniques Intervention(s): Adjust position;Assist with latch;Breast massage;Breast compression  Audible Swallowing:  None Intervention(s): Skin to skin;Hand expression Intervention(s): Hand expression  Type of Nipple: Everted at rest and after stimulation (Short nipples, assisted to compress and latch.) Intervention(s): No intervention needed  Comfort (Breast/Nipple): Soft / non-tender     Hold (Positioning): Assistance needed to correctly position infant at breast and maintain latch. Intervention(s): Breastfeeding basics reviewed;Support Pillows;Position options;Skin to skin  LATCH Score: 5  Lactation Tools Discussed/Used Tools: Pump Breast pump type: Manual   Consult Status Consult Status: Follow-up Date: 01/26/14 Follow-up type: In-patient    Geralynn OchsWILLIARD, Hillari Zumwalt 01/25/2014, 2:21 PM

## 2014-01-26 MED ORDER — LACTATED RINGERS IV BOLUS (SEPSIS): 500.0000 mL | Freq: Once | INTRAVENOUS | Status: DC

## 2014-01-26 NOTE — Progress Notes (Signed)
Subjective: Postpartum Day 2: Cesarean Delivery Patient reports tolerating PO, + flatus and no problems voiding.  Pt plans IUD.  Objective: Vital signs in last 24 hours: Temp:  [97.4 F (36.3 C)] 97.4 F (36.3 C) (01/28 0555) Pulse Rate:  [80] 80 (01/28 0555) Resp:  [18] 18 (01/28 0555) BP: (100)/(71) 100/71 mmHg (01/28 0555) SpO2:  [99 %] 99 % (01/28 0555)  Physical Exam:  General: alert and no distress Lochia: appropriate Uterine Fundus: firm Incision: healing well, honeycomb dressing in place DVT Evaluation: No evidence of DVT seen on physical exam.   Recent Labs  01/25/14 0600  HGB 9.8*  HCT 29.2*    Assessment/Plan: Status post Cesarean section. Doing well postoperatively.  Continue current care. Plan d/c tomorrow  Purcell NailsROBERTS,Aarvi Stotts Y 01/26/2014, 6:52 PM

## 2014-01-26 NOTE — Lactation Note (Signed)
This note was copied from the chart of Boy Jessica DareFadoua Wahbi GauseEl Horton. Lactation Consultation Note Follow up consult:  Baby boy 5853 hours old.  Upon entering the room, mother took baby off the breast, baby was sleeping.  Mother states he has been breastfeeding off and on since 11:30 am.  She states she is not sore.  Reviewed cluster feeding and deep wide latch with mother.  Encouraged mother to call for assistance with next feeding.   Patient Name: [name] [id number]'s Date: 01/26/2014     Maternal Data    Feeding Feeding Type: Breast Fed Length of feed:  (mother states off &on for almost 2 hours, 20 min each time)  [facility]                      Lactation Tools Discussed/Used     Consult Status      Hardie PulleyBerkelhammer, Ruth Boschen 01/26/2014, 1:27 PM

## 2014-01-27 MED ORDER — IBUPROFEN 600 MG PO TABS: 600.0000 mg | ORAL_TABLET | Freq: Four times a day (QID) | ORAL | Status: DC

## 2014-01-27 MED ORDER — FERROUS SULFATE 325 (65 FE) MG PO TABS
325.0000 mg | ORAL_TABLET | Freq: Two times a day (BID) | ORAL | Status: DC
Start: 2014-01-27 — End: 2016-10-17

## 2014-01-27 MED ORDER — OXYCODONE-ACETAMINOPHEN 5-325 MG PO TABS: 1.0000 | ORAL_TABLET | ORAL | Status: DC | PRN

## 2014-01-27 NOTE — Discharge Instructions (Signed)
Postpartum Care After Cesarean Delivery °After you deliver your newborn (postpartum period), the usual stay in the hospital is 24 72 hours. If there were problems with your labor or delivery, or if you have other medical problems, you might be in the hospital longer.  °While you are in the hospital, you will receive help and instructions on how to care for yourself and your newborn during the postpartum period.  °While you are in the hospital: °· It is normal for you to have pain or discomfort from the incision in your abdomen. Be sure to tell your nurses when you are having pain, where the pain is located, and what makes the pain worse. °· If you are breastfeeding, you may feel uncomfortable contractions of your uterus for a couple of weeks. This is normal. The contractions help your uterus get back to normal size. °· It is normal to have some bleeding after delivery. °· For the first 1 3 days after delivery, the flow is red and the amount may be similar to a period. °· It is common for the flow to start and stop. °· In the first few days, you may pass some small clots. Let your nurses know if you begin to pass large clots or your flow increases. °· Do not  flush blood clots down the toilet before having the nurse look at them. °· During the next 3 10 days after delivery, your flow should become more watery and pink or brown-tinged in color. °· Ten to fourteen days after delivery, your flow should be a small amount of yellowish-white discharge. °· The amount of your flow will decrease over the first few weeks after delivery. Your flow may stop in 6 8 weeks. Most women have had their flow stop by 12 weeks after delivery. °· You should change your sanitary pads frequently. °· Wash your hands thoroughly with soap and water for at least 20 seconds after changing pads, using the toilet, or before holding or feeding your newborn. °· Your intravenous (IV) tubing will be removed when you are drinking enough fluids. °· The  urine drainage tube (urinary catheter) that was inserted before delivery may be removed within 6 8 hours after delivery or when feeling returns to your legs. You should feel like you need to empty your bladder within the first 6 8 hours after the catheter has been removed. °· In case you become weak, lightheaded, or faint, call your nurse before you get out of bed for the first time and before you take a shower for the first time. °· Within the first few days after delivery, your breasts may begin to feel tender and full. This is called engorgement. Breast tenderness usually goes away within 48 72 hours after engorgement occurs. You may also notice milk leaking from your breasts. If you are not breastfeeding, do not stimulate your breasts. Breast stimulation can make your breasts produce more milk. °· Spending as much time as possible with your newborn is very important. During this time, you and your newborn can feel close and get to know each other. Having your newborn stay in your room (rooming in) will help to strengthen the bond with your newborn. It will give you time to get to know your newborn and become comfortable caring for your newborn. °· Your hormones change after delivery. Sometimes the hormone changes can temporarily cause you to feel sad or tearful. These feelings should not last more than a few days. If these feelings last longer   than that, you should talk to your caregiver.  If desired, talk to your caregiver about methods of family planning or contraception.  Talk to your caregiver about immunizations. Your caregiver may want you to have the following immunizations before leaving the hospital:  Tetanus, diphtheria, and pertussis (Tdap) or tetanus and diphtheria (Td) immunization. It is very important that you and your family (including grandparents) or others caring for your newborn are up-to-date with the Tdap or Td immunizations. The Tdap or Td immunization can help protect your newborn  from getting ill.  Rubella immunization.  Varicella (chickenpox) immunization.  Influenza immunization. You should receive this annual immunization if you did not receive the immunization during your pregnancy. Document Released: 09/09/2012 Document Reviewed: 09/09/2012 [facility] Patient Information 2014 [place], [place].  Postpartum Depression and Baby Blues The postpartum period begins right after the birth of a baby. During this time, there is often a great amount of joy and excitement. It is also a time of considerable changes in the life of the parent(s). Regardless of how many times a mother gives birth, each child brings new challenges and dynamics to the family. It is not unusual to have feelings of excitement accompanied by confusing shifts in moods, emotions, and thoughts. All mothers are at risk of developing postpartum depression or the "baby blues." These mood changes can occur right after giving birth, or they may occur many months after giving birth. The baby blues or postpartum depression can be mild or severe. Additionally, postpartum depression can resolve rather quickly, or it can be a long-term condition. CAUSES Elevated hormones and their rapid decline are thought to be a main cause of postpartum depression and the baby blues. There are a number of hormones that radically change during and after pregnancy. Estrogen and progesterone usually decrease immediately after delivering your baby. The level of thyroid hormone and various cortisol steroids also rapidly drop. Other factors that play a major role in these changes include major life events and genetics.  RISK FACTORS If you have any of the following risks for the baby blues or postpartum depression, know what symptoms to watch out for during the postpartum period. Risk factors that may increase the likelihood of getting the baby blues or postpartum depression include:  Havinga personal or family history of  depression.  Having depression while being pregnant.  Having premenstrual or oral contraceptive-associated mood issues.  Having exceptional life stress.  Having marital conflict.  Lacking a social support network.  Having a baby with special needs.  Having health problems such as diabetes. SYMPTOMS Baby blues symptoms include:  Brief fluctuations in mood, such as going from extreme happiness to sadness.  Decreased concentration.  Difficulty sleeping.  Crying spells, tearfulness.  Irritability.  Anxiety. Postpartum depression symptoms typically begin within the first month after giving birth. These symptoms include:  Difficulty sleeping or excessive sleepiness.  Marked weight loss.  Agitation.  Feelings of worthlessness.  Lack of interest in activity or food. Postpartum psychosis is a very concerning condition and can be dangerous. Fortunately, it is rare. Displaying any of the following symptoms is cause for immediate medical attention. Postpartum psychosis symptoms include:  Hallucinations and delusions.  Bizarre or disorganized behavior.  Confusion or disorientation. DIAGNOSIS  A diagnosis is made by an evaluation of your symptoms. There are no medical or lab tests that lead to a diagnosis, but there are various questionnaires that a caregiver may use to identify those with the baby blues, postpartum depression, or psychosis. Often times,  a screening tool called the [place] Postnatal Depression Scale is used to diagnose depression in the postpartum period.  TREATMENT The baby blues usually goes away on its own in 1 to 2 weeks. Social support is often all that is needed. You should be encouraged to get adequate sleep and rest. Occasionally, you may be given medicines to help you sleep.  Postpartum depression requires treatment as it can last several months or longer if it is not treated. Treatment may include individual or group therapy, medicine, or both to  address any social, physiological, and psychological factors that may play a role in the depression. Regular exercise, a healthy diet, rest, and social support may also be strongly recommended.  Postpartum psychosis is more serious and needs treatment right away. Hospitalization is often needed. HOME CARE INSTRUCTIONS  Get as much rest as you can. Nap when the baby sleeps.  Exercise regularly. Some women find yoga and walking to be beneficial.  Eat a balanced and nourishing diet.  Do little things that you enjoy. Have a cup of tea, take a bubble bath, read your favorite magazine, or listen to your favorite music.  Avoid alcohol.  Ask for help with household chores, cooking, grocery shopping, or running errands as needed. Do not try to do everything.  Talk to people close to you about how you are feeling. Get support from your partner, family members, friends, or other new moms.  Try to stay positive in how you think. Think about the things you are grateful for.  Do not spend a lot of time alone.  Only take medicine as directed by your caregiver.  Keep all your postpartum appointments.  Let your caregiver know if you have any concerns. SEEK MEDICAL CARE IF: You are having a reaction or problems with your medicine. SEEK IMMEDIATE MEDICAL CARE IF:  You have suicidal feelings.  You feel you may harm the baby or someone else. Document Released: 09/19/2004 Document Revised: 03/09/2012 Document Reviewed: 09/27/2013 [facility] Patient Information 2014 [place], [place].  Breastfeeding Deciding to breastfeed is one of the best choices you can make for you and your baby. A change in hormones during pregnancy causes your breast tissue to grow and increases the number and size of your milk ducts. These hormones also allow proteins, sugars, and fats from your blood supply to make breast milk in your milk-producing glands. Hormones prevent breast milk from being released before your baby is born  as well as prompt milk flow after birth. Once breastfeeding has begun, thoughts of your baby, as well as his or her sucking or crying, can stimulate the release of milk from your milk-producing glands.  BENEFITS OF BREASTFEEDING For Your Baby  Your first milk (colostrum) helps your baby's digestive system function better.   There are antibodies in your milk that help your baby fight off infections.   Your baby has a lower incidence of asthma, allergies, and sudden infant death syndrome.   The nutrients in breast milk are better for your baby than infant formulas and are designed uniquely for your baby's needs.   Breast milk improves your baby's brain development.   Your baby is less likely to develop other conditions, such as childhood obesity, asthma, or type 2 diabetes mellitus.  For You   Breastfeeding helps to create a very special bond between you and your baby.   Breastfeeding is convenient. Breast milk is always available at the correct temperature and costs nothing.   Breastfeeding helps to burn calories  and helps you lose the weight gained during pregnancy.   Breastfeeding makes your uterus contract to its prepregnancy size faster and slows bleeding (lochia) after you give birth.   Breastfeeding helps to lower your risk of developing type 2 diabetes mellitus, osteoporosis, and breast or ovarian cancer later in life. SIGNS THAT YOUR BABY IS HUNGRY Early Signs of Hunger  Increased alertness or activity.  Stretching.  Movement of the head from side to side.  Movement of the head and opening of the mouth when the corner of the mouth or cheek is stroked (rooting).  Increased sucking sounds, smacking lips, cooing, sighing, or squeaking.  Hand-to-mouth movements.  Increased sucking of fingers or hands. Late Signs of Hunger  Fussing.  Intermittent crying. Extreme Signs of Hunger Signs of extreme hunger will require calming and consoling before your baby  will be able to breastfeed successfully. Do not wait for the following signs of extreme hunger to occur before you initiate breastfeeding:   Restlessness.  A loud, strong cry.   Screaming. BREASTFEEDING BASICS Breastfeeding Initiation  Find a comfortable place to sit or lie down, with your neck and back well supported.  Place a pillow or rolled up blanket under your baby to bring him or her to the level of your breast (if you are seated). Nursing pillows are specially designed to help support your arms and your baby while you breastfeed.  Make sure that your baby's abdomen is facing your abdomen.   Gently massage your breast. With your fingertips, massage from your chest wall toward your nipple in a circular motion. This encourages milk flow. You may need to continue this action during the feeding if your milk flows slowly.  Support your breast with 4 fingers underneath and your thumb above your nipple. Make sure your fingers are well away from your nipple and your baby's mouth.   Stroke your baby's lips gently with your finger or nipple.   When your baby's mouth is open wide enough, quickly bring your baby to your breast, placing your entire nipple and as much of the colored area around your nipple (areola) as possible into your baby's mouth.   More areola should be visible above your baby's upper lip than below the lower lip.   Your baby's tongue should be between his or her lower gum and your breast.   Ensure that your baby's mouth is correctly positioned around your nipple (latched). Your baby's lips should create a seal on your breast and be turned out (everted).  It is common for your baby to suck about 2 3 minutes in order to start the flow of breast milk. Latching Teaching your baby how to latch on to your breast properly is very important. An improper latch can cause nipple pain and decreased milk supply for you and poor weight gain in your baby. Also, if your baby is  not latched onto your nipple properly, he or she may swallow some air during feeding. This can make your baby fussy. Burping your baby when you switch breasts during the feeding can help to get rid of the air. However, teaching your baby to latch on properly is still the best way to prevent fussiness from swallowing air while breastfeeding. Signs that your baby has successfully latched on to your nipple:    Silent tugging or silent sucking, without causing you pain.   Swallowing heard between every 3 4 sucks.    Muscle movement above and in front of his or her  ears while sucking.  Signs that your baby has not successfully latched on to nipple:   Sucking sounds or smacking sounds from your baby while breastfeeding.  Nipple pain. If you think your baby has not latched on correctly, slip your finger into the corner of your baby's mouth to break the suction and place it between your baby's gums. Attempt breastfeeding initiation again. Signs of Successful Breastfeeding Signs from your baby:   A gradual decrease in the number of sucks or complete cessation of sucking.   Falling asleep.   Relaxation of his or her body.   Retention of a small amount of milk in his or her mouth.   Letting go of your breast by himself or herself. Signs from you:  Breasts that have increased in firmness, weight, and size 1 3 hours after feeding.   Breasts that are softer immediately after breastfeeding.  Increased milk volume, as well as a change in milk consistency and color by the 5th day of breastfeeding.   Nipples that are not sore, cracked, or bleeding. Signs That Your [employer] is Getting Enough Milk  Wetting at least 3 diapers in a 24-hour period. The urine should be clear and pale yellow by age 34 days.  At least 3 stools in a 24-hour period by age 34 days. The stool should be soft and yellow.  At least 3 stools in a 24-hour period by age 39 days. The stool should be seedy and yellow.  No  loss of weight greater than 10% of birth weight during the first 11 days of age.  Average weight gain of 4 7 ounces (120 210 mL) per week after age 17 days.  Consistent daily weight gain by age 340 days, without weight loss after the age of 2 weeks. After a feeding, your baby may spit up a small amount. This is common. BREASTFEEDING FREQUENCY AND DURATION Frequent feeding will help you make more milk and can prevent sore nipples and breast engorgement. Breastfeed when you feel the need to reduce the fullness of your breasts or when your baby shows signs of hunger. This is called "breastfeeding on demand." Avoid introducing a pacifier to your baby while you are working to establish breastfeeding (the first 4 6 weeks after your baby is born). After this time you may choose to use a pacifier. Research has shown that pacifier use during the first year of a baby's life decreases the risk of sudden infant death syndrome (SIDS). Allow your baby to feed on each breast as long as he or she wants. Breastfeed until your baby is finished feeding. When your baby unlatches or falls asleep while feeding from the first breast, offer the second breast. Because newborns are often sleepy in the first few weeks of life, you may need to awaken your baby to get him or her to feed. Breastfeeding times will vary from baby to baby. However, the following rules can serve as a guide to help you ensure that your baby is properly fed:  Newborns (babies 71 weeks of age or younger) may breastfeed every 1 3 hours.  Newborns should not go longer than 3 hours during the day or 5 hours during the night without breastfeeding.  You should breastfeed your baby a minimum of 8 times in a 24-hour period until you begin to introduce solid foods to your baby at around 73 months of age. BREAST MILK PUMPING Pumping and storing breast milk allows you to ensure that your baby is exclusively fed  your breast milk, even at times when you are unable to  breastfeed. This is especially important if you are going back to work while you are still breastfeeding or when you are not able to be present during feedings. Your lactation consultant can give you guidelines on how long it is safe to store breast milk.  A breast pump is a machine that allows you to pump milk from your breast into a sterile bottle. The pumped breast milk can then be stored in a refrigerator or freezer. Some breast pumps are operated by hand, while others use electricity. Ask your lactation consultant which type will work best for you. Breast pumps can be purchased, but some hospitals and breastfeeding support groups lease breast pumps on a monthly basis. A lactation consultant can teach you how to hand express breast milk, if you prefer not to use a pump.  CARING FOR YOUR BREASTS WHILE YOU BREASTFEED Nipples can become dry, cracked, and sore while breastfeeding. The following recommendations can help keep your breasts moisturized and healthy:  Avoid using soap on your nipples.   Wear a supportive bra. Although not required, special nursing bras and tank tops are designed to allow access to your breasts for breastfeeding without taking off your entire bra or top. Avoid wearing underwire style bras or extremely tight bras.  Air dry your nipples for 3 14minutes after each feeding.   Use only cotton bra pads to absorb leaked breast milk. Leaking of breast milk between feedings is normal.   Use lanolin on your nipples after breastfeeding. Lanolin helps to maintain your skin's normal moisture barrier. If you use pure lanolin you do not need to wash it off before feeding your baby again. Pure lanolin is not toxic to your baby. You may also hand express a few drops of breast milk and gently massage that milk into your nipples and allow the milk to air dry. In the first few weeks after giving birth, some women experience extremely full breasts (engorgement). Engorgement can make your  breasts feel heavy, warm, and tender to the touch. Engorgement peaks within 3 5 days after you give birth. The following recommendations can help ease engorgement:  Completely empty your breasts while breastfeeding or pumping. You may want to start by applying warm, moist heat (in the shower or with warm water-soaked hand towels) just before feeding or pumping. This increases circulation and helps the milk flow. If your baby does not completely empty your breasts while breastfeeding, pump any extra milk after he or she is finished.  Wear a snug bra (nursing or regular) or tank top for 1 2 days to signal your body to slightly decrease milk production.  Apply ice packs to your breasts, unless this is too uncomfortable for you.  Make sure that your baby is latched on and positioned properly while breastfeeding. If engorgement persists after 48 hours of following these recommendations, contact your health care provider or a Science writer. OVERALL HEALTH CARE RECOMMENDATIONS WHILE BREASTFEEDING  Eat healthy foods. Alternate between meals and snacks, eating 3 of each per day. Because what you eat affects your breast milk, some of the foods may make your baby more irritable than usual. Avoid eating these foods if you are sure that they are negatively affecting your baby.  Drink milk, fruit juice, and water to satisfy your thirst (about 10 glasses a day).   Rest often, relax, and continue to take your prenatal vitamins to prevent fatigue, stress, and anemia.  Continue  breast self-awareness checks.  Avoid chewing and smoking tobacco.  Avoid alcohol and drug use. Some medicines that may be harmful to your baby can pass through breast milk. It is important to ask your health care provider before taking any medicine, including all over-the-counter and prescription medicine as well as vitamin and herbal supplements. It is possible to become pregnant while breastfeeding. If birth control is  desired, ask your health care provider about options that will be safe for your baby. SEEK MEDICAL CARE IF:   You feel like you want to stop breastfeeding or have become frustrated with breastfeeding.  You have painful breasts or nipples.  Your nipples are cracked or bleeding.  Your breasts are red, tender, or warm.  You have a swollen area on either breast.  You have a fever or chills.  You have nausea or vomiting.  You have drainage other than breast milk from your nipples.  Your breasts do not become full before feedings by the 5th day after you give birth.  You feel sad and depressed.  Your baby is too sleepy to eat well.  Your baby is having trouble sleeping.   Your baby is wetting less than 3 diapers in a 24-hour period.  Your baby has less than 3 stools in a 24-hour period.  Your baby's skin or the white part of his or her eyes becomes yellow.   Your baby is not gaining weight by 38 days of age. SEEK IMMEDIATE MEDICAL CARE IF:   Your baby is overly tired (lethargic) and does not want to wake up and feed.  Your baby develops an unexplained fever. Document Released: 12/16/2005 Document Revised: 08/18/2013 Document Reviewed: 06/09/2013 [facility] Patient Information 2014 [place].  Iron-Rich Diet An iron-rich diet contains foods that are good sources of iron. Iron is an important mineral that helps your body produce hemoglobin. Hemoglobin is a protein in red blood cells that carries oxygen to the body's tissues. Sometimes, the iron level in your blood can be low. This may be caused by:  A lack of iron in your diet.  Blood loss.  Times of growth, such as during pregnancy or during a child's growth and development. Low levels of iron can cause a decrease in the number of red blood cells. This can result in iron deficiency anemia. Iron deficiency anemia symptoms include:  Tiredness.  Weakness.  Irritability.  Increased chance of infection. Here are  some recommendations for daily iron intake:  Males older than 21 years of age need 8 mg of iron per day.  Women ages 39 to 63 need 18 mg of iron per day.  Pregnant women need 27 mg of iron per day, and women who are over 72 years of age and breastfeeding need 9 mg of iron per day.  Women over the age of 60 need 8 mg of iron per day. SOURCES OF IRON There are 2 types of iron that are found in food: heme iron and nonheme iron. Heme iron is absorbed by the body better than nonheme iron. Heme iron is found in meat, poultry, and fish. Nonheme iron is found in grains, beans, and vegetables. Heme Iron Sources Food / Iron (mg)  Chicken liver, 3 oz (85 g)/ 10 mg  Beef liver, 3 oz (85 g)/ 5.5 mg  Oysters, 3 oz (85 g)/ 8 mg  Beef, 3 oz (85 g)/ 2 to 3 mg  Shrimp, 3 oz (85 g)/ 2.8 mg  [place], 3 oz (85 g)/ 2 mg  Chicken, 3 oz (85 g) / 1 mg  Fish (tuna, halibut), 3 oz (85 g)/ 1 mg  Pork, 3 oz (85 g)/ 0.9 mg Nonheme Iron Sources Food / Iron (mg)  Ready-to-eat breakfast cereal, iron-fortified / 3.9 to 7 mg  Tofu,  cup / 3.4 mg  Kidney beans,  cup / 2.6 mg  Baked potato with skin / 2.7 mg  Asparagus,  cup / 2.2 mg  Avocado / 2 mg  Dried peaches,  cup / 1.6 mg  Raisins,  cup / 1.5 mg  Soy milk, 1 cup / 1.5 mg  Whole-wheat bread, 1 slice / 1.2 mg  Spinach, 1 cup / 0.8 mg  Broccoli,  cup / 0.6 mg IRON ABSORPTION Certain foods can decrease the body's absorption of iron. Try to avoid these foods and beverages while eating meals with iron-containing foods:  Coffee.  Tea.  Fiber.  Soy. Foods containing vitamin C can help increase the amount of iron your body absorbs from iron sources, especially from nonheme sources. Eat foods with vitamin C along with iron-containing foods to increase your iron absorption. Foods that are high in vitamin C include many fruits and vegetables. Some good sources are:  Fresh orange  juice.  Oranges.  Strawberries.  Mangoes.  Grapefruit.  Red bell peppers.  Green bell peppers.  Broccoli.  Potatoes with skin.  Tomato juice. Document Released: 07/30/2005 Document Revised: 03/09/2012 Document Reviewed: 06/06/2011 [facility] Patient Information 2014 [place], [place].  Levonorgestrel intrauterine device (IUD) What is this medicine? LEVONORGESTREL IUD (LEE voe nor jes trel) is a contraceptive (birth control) device. The device is placed inside the uterus by a healthcare professional. It is used to prevent pregnancy and can also be used to treat heavy bleeding that occurs during your period. Depending on the device, it can be used for 3 to 5 years. This medicine may be used for other purposes; ask your health care provider or pharmacist if you have questions. COMMON BRAND NAME(S): Gretta CoolMirena, Skyla What should I tell my health care provider before I take this medicine? They need to know if you have any of these conditions: -abnormal Pap smear -cancer of the breast, uterus, or cervix -diabetes -endometritis -genital or pelvic infection now or in the past -have more than one sexual partner or your partner has more than one partner -heart disease -history of an ectopic or tubal pregnancy -immune system problems -IUD in place -liver disease or tumor -problems with blood clots or take blood-thinners -use intravenous drugs -uterus of unusual shape -vaginal bleeding that has not been explained -an unusual or allergic reaction to levonorgestrel, other hormones, silicone, or polyethylene, medicines, foods, dyes, or preservatives -pregnant or trying to get pregnant -breast-feeding How should I use this medicine? This device is placed inside the uterus by a health care professional. Talk to your pediatrician regarding the use of this medicine in children. Special care may be needed. Overdosage: If you think you have taken too much of this medicine contact a poison  control center or emergency room at once. NOTE: This medicine is only for you. Do not share this medicine with others. What if I miss a dose? This does not apply. What may interact with this medicine? Do not take this medicine with any of the following medications: -amprenavir -bosentan -fosamprenavir This medicine may also interact with the following medications: -aprepitant -barbiturate medicines for inducing sleep or treating seizures -bexarotene -griseofulvin -medicines to treat seizures like carbamazepine, ethotoin, felbamate, oxcarbazepine, phenytoin, topiramate -modafinil -pioglitazone -rifabutin -  rifampin -rifapentine -some medicines to treat HIV infection like atazanavir, indinavir, lopinavir, nelfinavir, tipranavir, ritonavir -St. John's wort -warfarin This list may not describe all possible interactions. Give your health care provider a list of all the medicines, herbs, non-prescription drugs, or dietary supplements you use. Also tell them if you smoke, drink alcohol, or use illegal drugs. Some items may interact with your medicine. What should I watch for while using this medicine? Visit your doctor or health care professional for regular check ups. See your doctor if you or your partner has sexual contact with others, becomes HIV positive, or gets a sexual transmitted disease. This product does not protect you against HIV infection (AIDS) or other sexually transmitted diseases. You can check the placement of the IUD yourself by reaching up to the top of your vagina with clean fingers to feel the threads. Do not pull on the threads. It is a good habit to check placement after each menstrual period. Call your doctor right away if you feel more of the IUD than just the threads or if you cannot feel the threads at all. The IUD may come out by itself. You may become pregnant if the device comes out. If you notice that the IUD has come out use a backup birth control method like  condoms and call your health care provider. Using tampons will not change the position of the IUD and are okay to use during your period. What side effects may I notice from receiving this medicine? Side effects that you should report to your doctor or health care professional as soon as possible: -allergic reactions like skin rash, itching or hives, swelling of the face, lips, or tongue -fever, flu-like symptoms -genital sores -high blood pressure -no menstrual period for 6 weeks during use -pain, swelling, warmth in the leg -pelvic pain or tenderness -severe or sudden headache -signs of pregnancy -stomach cramping -sudden shortness of breath -trouble with balance, talking, or walking -unusual vaginal bleeding, discharge -yellowing of the eyes or skin Side effects that usually do not require medical attention (report to your doctor or health care professional if they continue or are bothersome): -acne -breast pain -change in sex drive or performance -changes in weight -cramping, dizziness, or faintness while the device is being inserted -headache -irregular menstrual bleeding within first 3 to 6 months of use -nausea This list may not describe all possible side effects. Call your doctor for medical advice about side effects. You may report side effects to FDA at 1-800-FDA-1088. Where should I keep my medicine? This does not apply. NOTE: This sheet is a summary. It may not cover all possible information. If you have questions about this medicine, talk to your doctor, pharmacist, or health care provider.  2014, Elsevier/Gold Standard. (2012-01-16 13:54:04)

## 2014-01-27 NOTE — Discharge Summary (Signed)
Cesarean Section Delivery Discharge Summary  Jessica Horton  DOB:    1991/12/13 MRN:    [id number] CSN:    [id number]  Date of admission:                  01/22/14  Date of discharge:                   01/27/14  Procedures this admission: C/s for FTP  Date of Delivery: 01/24/14 by Dr. Estanislado Pandy  Newborn Data:  Live born female  Birth Weight: 6 lb 11.8 oz (3056 g) APGAR: 9, 9  Home with mother. Name: "Jed" Circumcision Plan: Office  History of Present Illness:  Ms. Mycala Warshawsky El [name] is a 23 y.o. female, G1P1001, who presents at [redacted]w[redacted]d weeks gestation. The patient has been followed at the [facility] and Gynecology division of [facility].    Her pregnancy has been complicated by:  Patient Active Problem List   Diagnosis Date Noted  . Cesarean delivery delivered 01/24/2014  . Asthma, chronic 01/22/2014  . Migraines 01/22/2014     Hospital course:  The patient was admitted for early labor with occ mild variables.   Her postpartum course was not complicated.  She was discharged to home on postpartum day 3 doing well.  Feeding:  breast  Contraception:  IUD  Discharge hemoglobin:  Hemoglobin  Date Value Range Status  01/25/2014 9.8* 12.0 - 15.0 g/dL Final     HCT  Date Value Range Status  01/25/2014 29.2* 36.0 - 46.0 % Final    Discharge Physical Exam:   General: alert, cooperative and no distress Lochia: appropriate Uterine Fundus: firm Incision: healing well DVT Evaluation: No evidence of DVT seen on physical exam. Negative Homan's sign.  Intrapartum Procedures: cesarean: low cervical, transverse and GBS prophylaxis Postpartum Procedures: none Complications-Operative and Postpartum: none  Discharge Diagnoses: Term Pregnancy-delivered; Asymptomatic anemia  Discharge Information:  Activity:           pelvic rest Diet:                routine Medications: PNV, Ibuprofen, Iron and Percocet Condition:       stable Instructions:  Care After Cesarean Delivery  Refer to this sheet in the next few weeks. These instructions provide you with information on caring for yourself after your procedure. Your caregiver may also give you specific instructions. Your treatment has been planned according to current medical practices, but problems sometimes occur. Call your caregiver if you have any problems or questions after you go home. HOME CARE INSTRUCTIONS  Only take over-the-counter or prescription medicines as directed by your caregiver.  Do not drink alcohol, especially if you are breastfeeding or taking medicine to relieve pain.  Do not chew or smoke tobacco.  Continue to use good perineal care. Good perineal care includes:  Wiping your perineum from front to back.  Keeping your perineum clean.  Check your cut (incision) daily for increased redness, drainage, swelling, or separation of skin.  Clean your incision gently with soap and water every day, and then pat it dry. If your caregiver says it is okay, leave the incision uncovered. Use a bandage (dressing) if the incision is draining fluid or appears irritated. If the adhesive strips across the incision do not fall off within 7 days, carefully peel them off.  Hug a pillow when coughing or sneezing until your incision is healed. This helps to relieve pain.  Do not use  tampons or douche until your caregiver says it is okay.  Shower, wash your hair, and take tub baths as directed by your caregiver.  Wear a well-fitting bra that provides breast support.  Limit wearing support panties or control-top hose.  Drink enough fluids to keep your urine clear or pale yellow.  Eat high-fiber foods such as whole grain cereals and breads, brown rice, beans, and fresh fruits and vegetables every day. These foods may help prevent or relieve constipation.  Resume activities such as climbing stairs, driving, lifting, exercising, or traveling as directed by  your caregiver.  Talk to your caregiver about resuming sexual activities. This is dependent upon your risk of infection, your rate of healing, and your comfort and desire to resume sexual activity.  Try to have someone help you with your household activities and your newborn for at least a few days after you leave the hospital.  Rest as much as possible. Try to rest or take a nap when your newborn is sleeping.  Increase your activities gradually.  Keep all of your scheduled postpartum appointments. It is very important to keep your scheduled follow-up appointments. At these appointments, your caregiver will be checking to make sure that you are healing physically and emotionally. SEEK MEDICAL CARE IF:   You are passing large clots from your vagina. Save any clots to show your caregiver.  You have a foul smelling discharge from your vagina.  You have trouble urinating.  You are urinating frequently.  You have pain when you urinate.  You have a change in your bowel movements.  You have increasing redness, pain, or swelling near your incision.  You have pus draining from your incision.  Your incision is separating.  You have painful, hard, or reddened breasts.  You have a severe headache.  You have blurred vision or see spots.  You feel sad or depressed.  You have thoughts of hurting yourself or your newborn.  You have questions about your care, the care of your newborn, or medicines.  You are dizzy or lightheaded.  You have a rash.  You have pain, redness, or swelling at the site of the removed intravenous access (IV) tube.  You have nausea or vomiting.  You stopped breastfeeding and have not had a menstrual period within 12 weeks of stopping.  You are not breastfeeding and have not had a menstrual period within 12 weeks of delivery.  You have a fever. SEEK IMMEDIATE MEDICAL CARE IF:  You have persistent pain.  You have chest pain.  You have shortness of  breath.  You faint.  You have leg pain.  You have stomach pain.  Your vaginal bleeding saturates 2 or more sanitary pads in 1 hour. MAKE SURE YOU:   Understand these instructions.  Will watch your condition.  Will get help right away if you are not doing well or get worse. Document Released: 09/07/2002 Document Revised: 09/09/2012 Document Reviewed: 08/12/2012 [facility] Patient Information 2014 [place], [place].   Postpartum Depression and Baby Blues  The postpartum period begins right after the birth of a baby. During this time, there is often a great amount of joy and excitement. It is also a time of considerable changes in the life of the parent(s). Regardless of how many times a mother gives birth, each child brings new challenges and dynamics to the family. It is not unusual to have feelings of excitement accompanied by confusing shifts in moods, emotions, and thoughts. All mothers are at risk of developing  postpartum depression or the "baby blues." These mood changes can occur right after giving birth, or they may occur many months after giving birth. The baby blues or postpartum depression can be mild or severe. Additionally, postpartum depression can resolve rather quickly, or it can be a long-term condition. CAUSES Elevated hormones and their rapid decline are thought to be a main cause of postpartum depression and the baby blues. There are a number of hormones that radically change during and after pregnancy. Estrogen and progesterone usually decrease immediately after delivering your baby. The level of thyroid hormone and various cortisol steroids also rapidly drop. Other factors that play a major role in these changes include major life events and genetics.  RISK FACTORS If you have any of the following risks for the baby blues or postpartum depression, know what symptoms to watch out for during the postpartum period. Risk factors that may increase the likelihood of getting the  baby blues or postpartum depression include:  Havinga personal or family history of depression.  Having depression while being pregnant.  Having premenstrual or oral contraceptive-associated mood issues.  Having exceptional life stress.  Having marital conflict.  Lacking a social support network.  Having a baby with special needs.  Having health problems such as diabetes. SYMPTOMS Baby blues symptoms include:  Brief fluctuations in mood, such as going from extreme happiness to sadness.  Decreased concentration.  Difficulty sleeping.  Crying spells, tearfulness.  Irritability.  Anxiety. Postpartum depression symptoms typically begin within the first month after giving birth. These symptoms include:  Difficulty sleeping or excessive sleepiness.  Marked weight loss.  Agitation.  Feelings of worthlessness.  Lack of interest in activity or food. Postpartum psychosis is a very concerning condition and can be dangerous. Fortunately, it is rare. Displaying any of the following symptoms is cause for immediate medical attention. Postpartum psychosis symptoms include:  Hallucinations and delusions.  Bizarre or disorganized behavior.  Confusion or disorientation. DIAGNOSIS  A diagnosis is made by an evaluation of your symptoms. There are no medical or lab tests that lead to a diagnosis, but there are various questionnaires that a caregiver may use to identify those with the baby blues, postpartum depression, or psychosis. Often times, a screening tool called the [place] Postnatal Depression Scale is used to diagnose depression in the postpartum period.  TREATMENT The baby blues usually goes away on its own in 1 to 2 weeks. Social support is often all that is needed. You should be encouraged to get adequate sleep and rest. Occasionally, you may be given medicines to help you sleep.  Postpartum depression requires treatment as it can last several months or longer if it is  not treated. Treatment may include individual or group therapy, medicine, or both to address any social, physiological, and psychological factors that may play a role in the depression. Regular exercise, a healthy diet, rest, and social support may also be strongly recommended.  Postpartum psychosis is more serious and needs treatment right away. Hospitalization is often needed. HOME CARE INSTRUCTIONS  Get as much rest as you can. Nap when the baby sleeps.  Exercise regularly. Some women find yoga and walking to be beneficial.  Eat a balanced and nourishing diet.  Do little things that you enjoy. Have a cup of tea, take a bubble bath, read your favorite magazine, or listen to your favorite music.  Avoid alcohol.  Ask for help with household chores, cooking, grocery shopping, or running errands as needed. Do not try to do  everything.  Talk to people close to you about how you are feeling. Get support from your partner, family members, friends, or other new moms.  Try to stay positive in how you think. Think about the things you are grateful for.  Do not spend a lot of time alone.  Only take medicine as directed by your caregiver.  Keep all your postpartum appointments.  Let your caregiver know if you have any concerns. SEEK MEDICAL CARE IF: You are having a reaction or problems with your medicine. SEEK IMMEDIATE MEDICAL CARE IF:  You have suicidal feelings.  You feel you may harm the baby or someone else. Document Released: 09/19/2004 Document Revised: 03/09/2012 Document Reviewed: 10/22/2011 [facility] Patient Information 2014 [place], [place].  Discharge to: home  Follow-up Information   [facility]. Schedule an appointment as soon as possible for a visit in 5 weeks. (Call with any questions or concerns.)    Specialty:  Obstetrics and Gynecology   Contact information:   [address] [place]  [phone]       Haroldine LawsOXLEY, Tarren Sabree 01/27/2014

## 2014-10-31 ENCOUNTER — Encounter (HOSPITAL_COMMUNITY): Payer: Self-pay | Admitting: Obstetrics and Gynecology

## 2015-03-13 ENCOUNTER — Encounter (HOSPITAL_COMMUNITY): Payer: Self-pay | Admitting: Emergency Medicine

## 2015-03-13 ENCOUNTER — Emergency Department (HOSPITAL_COMMUNITY)
Admission: EM | Admit: 2015-03-13 | Discharge: 2015-03-14 | Discharge status: Against Medical Advice | Payer: Medicaid Other | Attending: Emergency Medicine | Admitting: Emergency Medicine

## 2015-03-13 DIAGNOSIS — M546 Pain in thoracic spine: Secondary | ICD-10-CM | POA: Insufficient documentation

## 2015-03-13 DIAGNOSIS — J45909 Unspecified asthma, uncomplicated: Secondary | ICD-10-CM | POA: Insufficient documentation

## 2015-03-13 DIAGNOSIS — R109 Unspecified abdominal pain: Secondary | ICD-10-CM | POA: Insufficient documentation

## 2015-03-13 DIAGNOSIS — R51 Headache: Secondary | ICD-10-CM | POA: Diagnosis not present

## 2015-03-13 DIAGNOSIS — R079 Chest pain, unspecified: Secondary | ICD-10-CM | POA: Insufficient documentation

## 2015-03-13 LAB — I-STAT TROPONIN, ED: Troponin i, poc: 0 ng/mL (ref 0.00–0.08)

## 2015-03-13 LAB — BASIC METABOLIC PANEL
Anion gap: 8 (ref 5–15)
BUN: 12 mg/dL (ref 6–23)
CHLORIDE: 106 mmol/L (ref 96–112)
CO2: 27 mmol/L (ref 19–32)
Calcium: 9.5 mg/dL (ref 8.4–10.5)
Creatinine, Ser: 0.55 mg/dL (ref 0.50–1.10)
GFR calc non Af Amer: >90 mL/min (ref >90)
GLUCOSE: 101 mg/dL — AB (ref 70–99)
Potassium: 3.8 mmol/L (ref 3.5–5.1)
Sodium: 141 mmol/L (ref 135–145)

## 2015-03-13 LAB — CBC
HCT: 40.7 % (ref 36.0–46.0)
Hemoglobin: 13.1 g/dL (ref 12.0–15.0)
MCH: 29 pg (ref 26.0–34.0)
MCHC: 32.2 g/dL (ref 30.0–36.0)
MCV: 90.2 fL (ref 78.0–100.0)
Platelets: 247 10*3/uL (ref 150–400)
RBC: 4.51 MIL/uL (ref 3.87–5.11)
RDW: 12.8 % (ref 11.5–15.5)
WBC: 8.3 10*3/uL (ref 4.0–10.5)

## 2015-03-13 NOTE — ED Notes (Signed)
Pt states having sharp pain in between her shoulder blades radiating into her chest/epigastric area over the last two days. Makes her feel SOB, and hurts to take a deep breath. Also complaining of a headache. Denies nausea/vomiting/diarrhea/fever/chills

## 2015-03-14 NOTE — ED Notes (Signed)
Pt called x 1 for room. No answer in lobby.  

## 2015-12-27 ENCOUNTER — Encounter (HOSPITAL_COMMUNITY): Payer: Self-pay | Admitting: Emergency Medicine

## 2015-12-27 ENCOUNTER — Emergency Department (HOSPITAL_COMMUNITY)
Admission: EM | Admit: 2015-12-27 | Discharge: 2015-12-27 | Disposition: A | Discharge status: Home / Self Care | Payer: Medicaid Other | Source: Home / Self Care | Attending: Emergency Medicine | Admitting: Emergency Medicine

## 2015-12-27 DIAGNOSIS — S0501XA Injury of conjunctiva and corneal abrasion without foreign body, right eye, initial encounter: Secondary | ICD-10-CM

## 2015-12-27 MED ORDER — EYE WASH OPHTH SOLN
OPHTHALMIC | Status: AC
Start: 2015-12-27 — End: 2015-12-27
  Filled 2015-12-27: qty 118

## 2015-12-27 MED ORDER — IBUPROFEN 600 MG PO TABS: 600.0000 mg | ORAL_TABLET | Freq: Four times a day (QID) | ORAL | Status: DC | PRN

## 2015-12-27 MED ORDER — TETRACAINE HCL 0.5 % OP SOLN
OPHTHALMIC | Status: AC
  Filled 2015-12-27: qty 2

## 2015-12-27 MED ORDER — POLYMYXIN B-TRIMETHOPRIM 10000-0.1 UNIT/ML-% OP SOLN: 1.0000 [drp] | Freq: Four times a day (QID) | OPHTHALMIC | Status: DC

## 2015-12-27 MED ORDER — FLUTICASONE PROPIONATE HFA 110 MCG/ACT IN AERO: 2.0000 | INHALATION_SPRAY | Freq: Two times a day (BID) | RESPIRATORY_TRACT | Status: DC

## 2015-12-27 MED ORDER — ALBUTEROL SULFATE HFA 108 (90 BASE) MCG/ACT IN AERS: 2.0000 | INHALATION_SPRAY | Freq: Four times a day (QID) | RESPIRATORY_TRACT | Status: DC | PRN

## 2015-12-27 NOTE — ED Notes (Signed)
Here with right eye irritation, redness and swelling after child stuck her in the eye last night Woke up eye closed shut with crusted drainage Denies dizziness

## 2015-12-27 NOTE — ED Provider Notes (Signed)
CSN: [id number]     Arrival date & time 12/27/15  1334 History   First MD Initiated Contact with Patient 12/27/15 1452     Chief Complaint  Patient presents with  . Eye Injury   (Consider location/radiation/quality/duration/timing/severity/associated sxs/prior Treatment) HPI She is a 24 year old woman here for evaluation of right eye irritation. She states she was playing with her son last night when he accidentally scratched her right eye. She states it was initially little uncomfortable but not that bad. Overnight it has gotten more uncomfortable. She states it feels like there is something in her eye. She denies any change in her vision, states she is more comfortable with her eye closed. There is a little bit of sensitivity to light. She reports increased watering of the eye.  Past Medical History  Diagnosis Date  . Asthma   . Asthma   . Headache(784.0)   . Migraine    Past Surgical History  Procedure Laterality Date  . Wisdom tooth extraction    . Cesarean section N/A 01/24/2014    Procedure: CESAREAN SECTION;  Surgeon: Esmeralda ArthurSandra A Rivard, MD;  Location: [facility];  Service: Obstetrics;  Laterality: N/A;   Family History  Problem Relation Age of Onset  . Diabetes Mother    Social History  Substance Use Topics  . Smoking status: Never Smoker   . Smokeless tobacco: Never Used  . Alcohol Use: No   OB History    Gravida Para Term Preterm AB TAB SAB Ectopic Multiple Living   1 1 1  0 0 0 0 0 0 1     Review of Systems  Allergies  Review of patient's allergies indicates no known allergies.  Home Medications   Prior to Admission medications   Medication Sig Start Date End Date Taking? Authorizing Provider  albuterol (PROVENTIL HFA;VENTOLIN HFA) 108 (90 Base) MCG/ACT inhaler Inhale 2 puffs into the lungs every 6 (six) hours as needed (asthma). 12/27/15   Charm RingsErin J Kelleigh Skerritt, MD  ferrous sulfate 325 (65 FE) MG tablet Take 1 tablet (325 mg total) by mouth 2 (two) times daily with a meal.  01/27/14   Haroldine LawsJennifer Oxley, CNM  fluticasone (FLOVENT HFA) 110 MCG/ACT inhaler Inhale 2 puffs into the lungs 2 (two) times daily. 12/27/15   Charm RingsErin J Donyea Beverlin, MD  ibuprofen (ADVIL,MOTRIN) 600 MG tablet Take 1 tablet (600 mg total) by mouth every 6 (six) hours as needed for moderate pain. 12/27/15   Charm RingsErin J Elieser Tetrick, MD  oxyCODONE-acetaminophen (PERCOCET/ROXICET) 5-325 MG per tablet Take 1-2 tablets by mouth every 4 (four) hours as needed for severe pain (moderate - severe pain). 01/27/14   Haroldine LawsJennifer Oxley, CNM  prenatal vitamin w/FE, FA (PRENATAL 1 + 1) 27-1 MG TABS tablet Take 1 tablet by mouth daily. 08/08/13   Deirdre Colin Mulders Poe, CNM  trimethoprim-polymyxin b (POLYTRIM) ophthalmic solution Place 1 drop into the right eye every 6 (six) hours. 12/27/15   Charm RingsErin J Kristoffer Bala, MD   Meds Ordered and Administered this Visit  Medications - No data to display  BP 122/55 mmHg  Pulse 71  Temp(Src) 98.1 F (36.7 C) (Oral)  SpO2 98%  LMP 12/14/2015  Breastfeeding? Unknown No data found.   Physical Exam  Constitutional: She is oriented to person, place, and time. She appears well-developed and well-nourished. No distress.  Eyes: EOM are normal. Pupils are equal, round, and reactive to light. Right eye exhibits discharge (clear). Left eye exhibits no discharge. Right conjunctiva is injected. Right conjunctiva has no hemorrhage. Left conjunctiva  is not injected. Left conjunctiva has no hemorrhage.  Slit lamp exam:      The right eye shows fluorescein uptake.    Neck: Neck supple.  Cardiovascular: Normal rate.   Pulmonary/Chest: Effort normal.  Neurological: She is alert and oriented to person, place, and time.    ED Course  Procedures (including critical care time)  Labs Review Labs Reviewed - No data to display  Imaging Review No results found.    MDM   1. Corneal abrasion, right, initial encounter    We'll treat with Polytrim eyedrops. Ibuprofen for pain. Follow-up with ophthalmology if not resolving  in 2 days. I also provided refills of her albuterol and Flovent.    Charm Rings, MD 12/27/15 [phone]

## 2015-12-27 NOTE — Discharge Instructions (Signed)
Corneal Abrasion The cornea is the clear covering at the front and center of the eye. When you look at the colored portion of the eye, you are looking through the cornea. It is a thin tissue made up of layers. The top layer is the most sensitive layer. A corneal abrasion happens if this layer is scratched or an injury causes it to come off.  HOME CARE  You may be given drops or a medicated cream. Use the medicine as told by your doctor.  If you are not improving by Friday, please call Dr. Alvino Chapelhoi for an appointment. GET HELP IF:   You have pain, are sensitive to light, and have a scratchy feeling in one eye or both eyes.  Your pressure patch keeps getting loose. You can blink your eye under the patch.  You have fluid coming from your eye or the lids stick together in the morning.  You have the same symptoms in the morning that you did with the first abrasion. This could be days, weeks, or months after the first abrasion healed.   This information is not intended to replace advice given to you by your health care provider. Make sure you discuss any questions you have with your health care provider.   Document Released: 06/03/2008 Document Revised: 09/06/2015 Document Reviewed: 08/23/2013 Elsevier Interactive Patient Education [employer]2016 Elsevier Inc.

## 2016-03-04 ENCOUNTER — Emergency Department (HOSPITAL_COMMUNITY): Payer: Medicaid Other

## 2016-03-04 ENCOUNTER — Emergency Department (HOSPITAL_COMMUNITY)
Admission: EM | Admit: 2016-03-04 | Discharge: 2016-03-04 | Disposition: A | Discharge status: Home / Self Care | Payer: Medicaid Other | Attending: Emergency Medicine | Admitting: Emergency Medicine

## 2016-03-04 ENCOUNTER — Encounter (HOSPITAL_COMMUNITY): Payer: Self-pay | Admitting: Emergency Medicine

## 2016-03-04 DIAGNOSIS — Z79899 Other long term (current) drug therapy: Secondary | ICD-10-CM | POA: Insufficient documentation

## 2016-03-04 DIAGNOSIS — Y9342 Activity, yoga: Secondary | ICD-10-CM | POA: Diagnosis not present

## 2016-03-04 DIAGNOSIS — X58XXXA Exposure to other specified factors, initial encounter: Secondary | ICD-10-CM | POA: Diagnosis not present

## 2016-03-04 DIAGNOSIS — Z7951 Long term (current) use of inhaled steroids: Secondary | ICD-10-CM | POA: Diagnosis not present

## 2016-03-04 DIAGNOSIS — S63501A Unspecified sprain of right wrist, initial encounter: Secondary | ICD-10-CM | POA: Diagnosis not present

## 2016-03-04 DIAGNOSIS — S6991XA Unspecified injury of right wrist, hand and finger(s), initial encounter: Secondary | ICD-10-CM | POA: Diagnosis present

## 2016-03-04 DIAGNOSIS — Y9289 Other specified places as the place of occurrence of the external cause: Secondary | ICD-10-CM | POA: Diagnosis not present

## 2016-03-04 DIAGNOSIS — J45909 Unspecified asthma, uncomplicated: Secondary | ICD-10-CM | POA: Diagnosis not present

## 2016-03-04 DIAGNOSIS — Y998 Other external cause status: Secondary | ICD-10-CM | POA: Insufficient documentation

## 2016-03-04 DIAGNOSIS — Z8679 Personal history of other diseases of the circulatory system: Secondary | ICD-10-CM | POA: Insufficient documentation

## 2016-03-04 MED ORDER — ALBUTEROL SULFATE HFA 108 (90 BASE) MCG/ACT IN AERS: 2.0000 | INHALATION_SPRAY | Freq: Four times a day (QID) | RESPIRATORY_TRACT | Status: DC | PRN

## 2016-03-04 MED ORDER — FLUTICASONE PROPIONATE HFA 110 MCG/ACT IN AERO: 2.0000 | INHALATION_SPRAY | Freq: Two times a day (BID) | RESPIRATORY_TRACT | Status: DC

## 2016-03-04 MED ORDER — IBUPROFEN 600 MG PO TABS: 600.0000 mg | ORAL_TABLET | Freq: Four times a day (QID) | ORAL | Status: DC | PRN

## 2016-03-04 NOTE — Discharge Instructions (Signed)
Wrist Sprain °A wrist sprain is a stretch or tear in the strong, fibrous tissues (ligaments) that connect your wrist bones. The ligaments of your wrist may be easily sprained. There are three types of wrist sprains. °· Grade 1. The ligament is not stretched or torn, but the sprain causes pain. °· Grade 2. The ligament is stretched or partially torn. You may be able to move your wrist, but not very much. °· Grade 3. The ligament or muscle completely tears. You may find it difficult or extremely painful to move your wrist even a little. °CAUSES °Often, wrist sprains are a result of a fall or an injury. The force of the impact causes the fibers of your ligament to stretch too much or tear. Common causes of wrist sprains include: °· Overextending your wrist while catching a ball with your hands. °· Repetitive or strenuous extension or bending of your wrist. °· Landing on your hand during a fall. °RISK FACTORS °· Having previous wrist injuries. °· Playing contact sports, such as boxing or wrestling. °· Participating in activities in which falling is common. °· Having poor wrist strength and flexibility. °SIGNS AND SYMPTOMS °· Wrist pain. °· Wrist tenderness. °· Inflammation or bruising of the wrist area. °· Hearing a "pop" or feeling a tear at the time of the injury. °· Decreased wrist movement due to pain, stiffness, or weakness. °DIAGNOSIS °Your health care provider will examine your wrist. In some cases, an X-ray will be taken to make sure you did not break any bones. If your health care provider thinks that you tore a ligament, he or she may order an MRI of your wrist. °TREATMENT °Treatment involves resting and icing your wrist. You may also need to take pain medicines to help lessen pain and inflammation. Your health care provider may recommend keeping your wrist still (immobilized) with a splint to help your sprain heal. When the splint is no longer necessary, you may need to perform strengthening and stretching  exercises. These exercises help you to regain strength and full range of motion in your wrist. Surgery is not usually needed for wrist sprains unless the ligament completely tears. °HOME CARE INSTRUCTIONS °· Rest your wrist. Do not do things that cause pain. °· Wear your wrist splint as directed by your health care provider. °· Take medicines only as directed by your health care provider. °· To ease pain and swelling, apply ice to the injured area. °¨ Put ice in a plastic bag. °¨ Place a towel between your skin and the bag. °¨ Leave the ice on for 20 minutes, 2-3 times a day. °SEEK MEDICAL CARE IF: °· Your pain, discomfort, or swelling gets worse even with treatment. °· You feel sudden numbness in your hand. °  °This information is not intended to replace advice given to you by your health care provider. Make sure you discuss any questions you have with your health care provider. °  °Document Released: 08/19/2014 Document Reviewed: 08/19/2014 °Elsevier Interactive Patient Education ©2016 Elsevier Inc. ° °

## 2016-03-04 NOTE — ED Provider Notes (Signed)
CSN: [id number]     Arrival date & time 03/04/16  1859 History  By signing my name below, I, [facility], attest that this documentation has been prepared under the direction and in the presence of Dorthula Matas, PA-C. Electronically Signed: Randell Patient, ED Scribe. 03/04/2016. 10:38 PM.   Chief Complaint  Patient presents with  . Wrist Pain  . Hand Pain    The history is provided by the patient. No language interpreter was used.   HPI Comments: Rufina Kimery El [name] is a 25 y.o. female with an hx of asthma who presents to the Emergency Department complaining of constant, 8/10, unchanging right wrist pain and intermittent right hand swelling onset 4 days ago. Patient reports that she began taking a yoga class required for school 1 month ago with pain increasing in her right wrist, hand, and fingers over the past few days. She endorses associated swelling in the right hand and fingers. She has tried ibuprofen without relief and applied an ace bandage that she takes off for yoga. Pain is worse with twisting motions and exerting pressure with her hands. Per patient, she denies taking a break from practicing yoga. She denies erythema and paraesthesia.  Patient also reported that she run out of her albuterol and fluticasone inhalers  for treatment of her asthma and requests refills for both of these medications.   Past Medical History  Diagnosis Date  . Asthma   . Asthma   . Headache(784.0)   . Migraine    Past Surgical History  Procedure Laterality Date  . Wisdom tooth extraction    . Cesarean section N/A 01/24/2014    Procedure: CESAREAN SECTION;  Surgeon: Esmeralda Arthur, MD;  Location: [facility];  Service: Obstetrics;  Laterality: N/A;   Family History  Problem Relation Age of Onset  . Diabetes Mother    Social History  Substance Use Topics  . Smoking status: Never Smoker   . Smokeless tobacco: Never Used  . Alcohol Use: No   OB History    Gravida Para Term  Preterm AB TAB SAB Ectopic Multiple Living   0 0 0 0 0 0 1     Review of Systems  Musculoskeletal: Positive for arthralgias (right wrist).       Positive for swelling diffusely in right hand.  Skin: Negative for color change (erythema).       Negative for warmth.  Neurological:       Negative for paraesthesia.  All other systems reviewed and are negative.     Allergies  Review of patient's allergies indicates no known allergies.  Home Medications   Prior to Admission medications   Medication Sig Start Date End Date Taking? Authorizing Provider  albuterol (PROVENTIL HFA;VENTOLIN HFA) 108 (90 Base) MCG/ACT inhaler Inhale 2 puffs into the lungs every 6 (six) hours as needed (asthma). 12/27/15   Charm Rings, MD  ferrous sulfate 325 (65 FE) MG tablet Take 1 tablet (325 mg total) by mouth 2 (two) times daily with a meal. 01/27/14   Haroldine Laws, CNM  fluticasone (FLOVENT HFA) 110 MCG/ACT inhaler Inhale 2 puffs into the lungs 2 (two) times daily. 12/27/15   Charm Rings, MD  ibuprofen (ADVIL,MOTRIN) 600 MG tablet Take 1 tablet (600 mg total) by mouth every 6 (six) hours as needed for moderate pain. 12/27/15   Charm Rings, MD  oxyCODONE-acetaminophen (PERCOCET/ROXICET) 5-325 MG per tablet Take 1-2 tablets by mouth every 4 (four) hours as  needed for severe pain (moderate - severe pain). 01/27/14   Haroldine LawsJennifer Oxley, CNM  prenatal vitamin w/FE, FA (PRENATAL 1 + 1) 27-1 MG TABS tablet Take 1 tablet by mouth daily. 08/08/13   Deirdre Colin Mulders Poe, CNM  trimethoprim-polymyxin b (POLYTRIM) ophthalmic solution Place 1 drop into the right eye every 6 (six) hours. 12/27/15   Charm RingsErin J Honig, MD   BP 113/71 mmHg  Pulse 80  Temp(Src) 98.2 F (36.8 C) (Oral)  Resp 18  SpO2 100%  LMP 02/14/2016 (Approximate) Physical Exam  Constitutional: She is oriented to person, place, and time. She appears well-developed and well-nourished. No distress.  HENT:  Head: Normocephalic and atraumatic.  Eyes:  Conjunctivae and EOM are normal.  Neck: Neck supple. No tracheal deviation present.  Cardiovascular: Normal rate.   Pulmonary/Chest: Effort normal. No respiratory distress.  Musculoskeletal: Normal range of motion.  Decreased grip strength due to pain. Pain with elbow pronation and flexion of right wrist. Pain with thumb ROM on right hand. Pain with right wrist ROM. No rashes. Small effusion to right wrist without any erythema or induration. No TTP.  Neurological: She is alert and oriented to person, place, and time.  Skin: Skin is warm and dry.  Psychiatric: She has a normal mood and affect. Her behavior is normal.  Nursing note and vitals reviewed.   ED Course  Procedures   DIAGNOSTIC STUDIES: Oxygen Saturation is 100% on RA, normal by my interpretation.    COORDINATION OF CARE: 10:27 PM Discussed results of x-rays. Will order wrist splint. Will prescribe pain medication. Will provide pt with referral to hand specialist. Advised pt to ice and stretch the wrist. Will write not excusing pt from yoga class. Discussed treatment plan with pt at bedside and pt agreed to plan.   Imaging Review Dg Wrist Complete Right  03/04/2016  CLINICAL DATA:  Swelling, no injury EXAM: RIGHT WRIST - COMPLETE 3+ VIEW COMPARISON:  None. FINDINGS: Negative for fracture. No significant arthropathy. Joint spaces are normal. Pronator fat pad is displaced secondary to effusion. IMPRESSION: Negative for fracture. Pronator fat pad displacement compatible with effusion. Electronically Signed   By: Marlan Palauharles  Clark M.D.   On: 03/04/2016 20:06   Dg Hand Complete Right  03/04/2016  CLINICAL DATA:  Patient with numbness in the first through third fingers. Posterior wrist pain. No known injury. Initial encounter. EXAM: RIGHT HAND - COMPLETE 3+ VIEW COMPARISON:  None. FINDINGS: Normal anatomic alignment. No evidence for acute fracture or dislocation. Regional soft tissues are unremarkable. IMPRESSION: No acute osseous  abnormality. Electronically Signed   By: Annia Beltrew  Davis M.D.   On: 03/04/2016 20:05   I have personally reviewed and evaluated these images and lab results as part of my medical decision-making.   EKG Interpretation None      MDM   Final diagnoses:  Wrist sprain, right, initial encounter    Patient X-Ray negative for obvious fracture or dislocation.  Pt advised to follow up with orthopedics. Patient given wrist splint while in ED, conservative therapy recommended and discussed. Patient will be discharged home & is agreeable with above plan. Returns precautions discussed. Pt appears safe for discharge.  I personally performed the services described in this documentation, which was scribed in my presence. The recorded information has been reviewed and is accurate.   Marlon Peliffany [place], PA-C 03/04/16 2251  Lorre NickAnthony Allen, MD 03/04/16 [phone]

## 2016-03-04 NOTE — ED Notes (Signed)
Pt states that she does Yoga and think sshe may have hurt her wrist doing this. R hand swelling and R wrist pain x 4 days. Alert and oriented.

## 2016-10-17 ENCOUNTER — Encounter (HOSPITAL_COMMUNITY): Payer: Self-pay

## 2016-10-17 ENCOUNTER — Emergency Department (HOSPITAL_COMMUNITY)
Admission: EM | Admit: 2016-10-17 | Discharge: 2016-10-17 | Disposition: A | Discharge status: Home / Self Care | Payer: Medicaid Other | Attending: Emergency Medicine | Admitting: Emergency Medicine

## 2016-10-17 DIAGNOSIS — G43909 Migraine, unspecified, not intractable, without status migrainosus: Secondary | ICD-10-CM | POA: Diagnosis present

## 2016-10-17 DIAGNOSIS — J45909 Unspecified asthma, uncomplicated: Secondary | ICD-10-CM | POA: Insufficient documentation

## 2016-10-17 DIAGNOSIS — Z79899 Other long term (current) drug therapy: Secondary | ICD-10-CM | POA: Insufficient documentation

## 2016-10-17 LAB — PREGNANCY, URINE: Preg Test, Ur: NEGATIVE

## 2016-10-17 MED ORDER — PROCHLORPERAZINE EDISYLATE 5 MG/ML IJ SOLN
10.0000 mg | Freq: Once | INTRAMUSCULAR | Status: AC
  Administered 2016-10-17: 10 mg via INTRAVENOUS
  Filled 2016-10-17: qty 2

## 2016-10-17 MED ORDER — DIPHENHYDRAMINE HCL 50 MG/ML IJ SOLN
25.0000 mg | Freq: Once | INTRAMUSCULAR | Status: AC
  Administered 2016-10-17: 25 mg via INTRAVENOUS
  Filled 2016-10-17: qty 1

## 2016-10-17 MED ORDER — IBUPROFEN 200 MG PO TABS: 400.0000 mg | ORAL_TABLET | Freq: Four times a day (QID) | ORAL | 0 refills | Status: DC | PRN

## 2016-10-17 MED ORDER — SODIUM CHLORIDE 0.9 % IV BOLUS (SEPSIS)
500.0000 mL | Freq: Once | INTRAVENOUS | Status: AC
  Administered 2016-10-17: 500 mL via INTRAVENOUS

## 2016-10-17 MED ORDER — BUTALBITAL-APAP-CAFFEINE 50-325-40 MG PO TABS
1.0000 | ORAL_TABLET | Freq: Four times a day (QID) | ORAL | 0 refills | Status: DC | PRN
Start: 2016-10-17 — End: 2016-10-17

## 2016-10-17 MED ORDER — KETOROLAC TROMETHAMINE 30 MG/ML IJ SOLN
30.0000 mg | Freq: Once | INTRAMUSCULAR | Status: AC
  Administered 2016-10-17: 30 mg via INTRAVENOUS
  Filled 2016-10-17: qty 1

## 2016-10-17 MED ORDER — BUTALBITAL-APAP-CAFFEINE 50-325-40 MG PO TABS: 1.0000 | ORAL_TABLET | Freq: Four times a day (QID) | ORAL | 0 refills | Status: AC | PRN

## 2016-10-17 MED ORDER — ONDANSETRON 4 MG PO TBDP
4.0000 mg | ORAL_TABLET | Freq: Once | ORAL | Status: AC | PRN
  Administered 2016-10-17: 4 mg via ORAL
  Filled 2016-10-17: qty 1

## 2016-10-17 MED ORDER — IBUPROFEN 200 MG PO TABS
600.0000 mg | ORAL_TABLET | Freq: Once | ORAL | Status: AC | PRN
  Administered 2016-10-17: 600 mg via ORAL
  Filled 2016-10-17: qty 3

## 2016-10-17 NOTE — ED Provider Notes (Signed)
[facility]-EMERGENCY DEPT Provider Note   CSN: [id number] Arrival date & time: 10/17/16  1635     History   Chief Complaint Chief Complaint  Patient presents with  . Migraine    HPI Ethelyne Maggie SchwalbeWahbi El Theresia Boughlaoui is a 25 y.o. female.  Patient is 25 yo F with PMH of migraines, presenting with constant migraine headache x 4 days. States typical migraines feel like tight band around head with pressure at left parietal region; however, she is also experiencing some left neck "tightness" and left occular pressure with this episode. Past migraines relieved with Fioricet and ibuprofen, but she ran out of prescription Fiorcet, and ibuprofen alone has not relieved pain. Also experiencing some photophobia and blurred vision in left eye yesterday, but no current vision changes, dizziness, numbness, weakness, or difficulty ambulating. Denies any fever, chills, neck pain or stiffness. No recent trauma. States she just started her menstrual cycle. No associated triggers, or past migraines at onset of menses. She denies any alcohol, drug, or tobacco use. Denies seeing neurologist or PCP.      Past Medical History:  Diagnosis Date  . Asthma   . Asthma   . Headache(784.0)   . Migraine     Patient Active Problem List   Diagnosis Date Noted  . Cesarean delivery delivered 01/24/2014  . Asthma, chronic 01/22/2014  . Migraines 01/22/2014    Past Surgical History:  Procedure Laterality Date  . CESAREAN SECTION N/A 01/24/2014   Procedure: CESAREAN SECTION;  Surgeon: Esmeralda ArthurSandra A Rivard, MD;  Location: [facility];  Service: Obstetrics;  Laterality: N/A;  . WISDOM TOOTH EXTRACTION      OB History    Gravida Para Term Preterm AB Living   1 1 1  0 0 1   SAB TAB Ectopic Multiple Live Births   0 0 0 0 1       Home Medications    Prior to Admission medications   Medication Sig Start Date End Date Taking? Authorizing Provider  albuterol (PROVENTIL HFA;VENTOLIN HFA) 108 (90 Base) MCG/ACT inhaler Inhale 2 puffs  into the lungs every 6 (six) hours as needed (asthma). 03/04/16  Yes Tiffany Neva SeatGreene, PA-C  fluticasone (FLOVENT HFA) 110 MCG/ACT inhaler Inhale 2 puffs into the lungs 2 (two) times daily. 03/04/16  Yes Tiffany Neva SeatGreene, PA-C  ibuprofen (ADVIL,MOTRIN) 200 MG tablet Take 400-600 mg by mouth every 6 (six) hours as needed for headache.   Yes Historical Provider, MD  ferrous sulfate 325 (65 FE) MG tablet Take 1 tablet (325 mg total) by mouth 2 (two) times daily with a meal. Patient not taking: Reported on 10/17/2016 01/27/14   Haroldine LawsJennifer Oxley, CNM  ibuprofen (ADVIL,MOTRIN) 600 MG tablet Take 1 tablet (600 mg total) by mouth every 6 (six) hours as needed for moderate pain. Patient not taking: Reported on 10/17/2016 12/27/15   Charm RingsErin J Honig, MD  ibuprofen (ADVIL,MOTRIN) 600 MG tablet Take 1 tablet (600 mg total) by mouth every 6 (six) hours as needed. Patient not taking: Reported on 10/17/2016 03/04/16   Marlon Peliffany Greene, PA-C  oxyCODONE-acetaminophen (PERCOCET/ROXICET) 5-325 MG per tablet Take 1-2 tablets by mouth every 4 (four) hours as needed for severe pain (moderate - severe pain). Patient not taking: Reported on 10/17/2016 01/27/14   Haroldine LawsJennifer Oxley, CNM  prenatal vitamin w/FE, FA (PRENATAL 1 + 1) 27-1 MG TABS tablet Take 1 tablet by mouth daily. Patient not taking: Reported on 10/17/2016 08/08/13   Deirdre Colin Mulders Poe, CNM  trimethoprim-polymyxin b (POLYTRIM) ophthalmic solution Place 1 drop into  the right eye every 6 (six) hours. Patient not taking: Reported on 10/17/2016 12/27/15   Charm Rings, MD    Family History Family History  Problem Relation Age of Onset  . Diabetes Mother     Social History Social History  Substance Use Topics  . Smoking status: Never Smoker  . Smokeless tobacco: Never Used  . Alcohol use No     Allergies   Review of patient's allergies indicates no known allergies.   Review of Systems Review of Systems  Constitutional: Negative for chills and fever.  HENT: Negative for  ear pain and sore throat.   Eyes: Positive for photophobia and visual disturbance (blurred vision in left eye yesterday only). Negative for pain and redness.  Respiratory: Negative for cough and shortness of breath.   Cardiovascular: Negative for chest pain, palpitations and leg swelling.  Gastrointestinal: Negative for abdominal pain, blood in stool, nausea and vomiting.  Genitourinary: Negative for dysuria, flank pain and hematuria.  Musculoskeletal: Positive for neck pain. Negative for back pain and neck stiffness.  Skin: Negative for color change and rash.  Neurological: Positive for headaches. Negative for dizziness, seizures, syncope, weakness and numbness.     Physical Exam Updated Vital Signs BP 116/74   Pulse 88   Temp 98.3 F (36.8 C)   Resp 20   Ht 5\' 1"  (1.549 m)   Wt 69.9 kg   LMP 10/16/2016   SpO2 97%   BMI 29.14 kg/m   Physical Exam  Constitutional: She appears well-developed and well-nourished.  Lying comfortably in bed, no acute distress.  HENT:  Head: Normocephalic and atraumatic.  Mouth/Throat: Oropharynx is clear and moist.  TMs and ear canals normal bilaterally. No frontal or maxillary sinus tenderness.  Eyes: Conjunctivae and EOM are normal. Pupils are equal, round, and reactive to light.  Neck: Normal range of motion. Neck supple.  No midline cervical tenderness, crepitus, or deformity. Mild TTP of left SCM and trapezius musculature. No nuchal rigidity or meningeal signs.  Cardiovascular: Normal rate, regular rhythm, normal heart sounds and intact distal pulses.   Pulmonary/Chest: Effort normal and breath sounds normal. No respiratory distress.  Abdominal: Soft. There is no tenderness.  Musculoskeletal: Normal range of motion. She exhibits no edema or tenderness.  Neurological: She is alert.  Speech is clear and goal oriented, follows commands. Cranial nerves III - XII without deficit, no facial droop. Normal strength in upper and lower extremities  bilaterally, strong and equal grip strength. Sensation normal to light and sharp touch. Moves extremities without ataxia, coordination intact. Normal finger to nose and rapid alternating movements. Negative Romberg, no pronator drift. Normal gait.  Skin: Skin is warm and dry. No rash noted.  Psychiatric: She has a normal mood and affect.  Nursing note and vitals reviewed.    ED Treatments / Results  Labs (all labs ordered are listed, but only abnormal results are displayed) Labs Reviewed  PREGNANCY, URINE    EKG  EKG Interpretation None       Radiology No results found.  Procedures Procedures (including critical care time)  Medications Ordered in ED Medications  ibuprofen (ADVIL,MOTRIN) tablet 600 mg (600 mg Oral Given 10/17/16 1656)  ondansetron (ZOFRAN-ODT) disintegrating tablet 4 mg (4 mg Oral Given 10/17/16 1656)  sodium chloride 0.9 % bolus 500 mL (0 mLs Intravenous Stopped 10/17/16 1954)  prochlorperazine (COMPAZINE) injection 10 mg (10 mg Intravenous Given 10/17/16 1909)  diphenhydrAMINE (BENADRYL) injection 25 mg (25 mg Intravenous Given 10/17/16 1905)  ketorolac (  TORADOL) 30 MG/ML injection 30 mg (30 mg Intravenous Given 10/17/16 1907)     Initial Impression / Assessment and Plan / ED Course  I have reviewed the triage vital signs and the nursing notes.  Pertinent labs & imaging results that were available during my care of the patient were reviewed by me and considered in my medical decision making (see chart for details).  Clinical Course   Patient is 25 yo F with PMH of migraines, presenting with constant migraine headache x 4 days. Similar to past migraines, with some left neck "tightness" and occular pressure. Denies fever or nuchal rigidity concerning for meningitis. VSS and completely normal neuro exam. Imaging deferred per discussion with attending physician, Dr. Benjiman Core. Given migraine cocktail of Compazine, Benadryl, and Toradol. Also,  given high flow oxygen since she described monocular pressure, similar in presentation to cluster headaches. On reassessment, patient states migraine completely resolved. Given prescription ibuprofen and Fioricet, as well as referall to [facility] to establish care with PCP, and [facility] for management of chronic migraines. Stable for d/c home and advised to return to ED for fever, neck pain or stiffness, vision changes, intractable migraines, or concerning neurological symptoms as discussed with patient.  Final Clinical Impressions(s) / ED Diagnoses   Final diagnoses:  Migraine without status migrainosus, not intractable, unspecified migraine type    New Prescriptions Discharge Medication List as of 10/17/2016  8:45 PM    START taking these medications   Details  butalbital-acetaminophen-caffeine (FIORICET, ESGIC) 50-325-40 MG tablet Take 1 tablet by mouth every 6 (six) hours as needed for headache., Starting Thu 10/17/2016, Until Fri 10/17/2017, Print         Marios Gaiser F [place], [place] 10/17/16 2116    Benjiman Core, MD 10/18/16 [phone]

## 2016-10-17 NOTE — ED Triage Notes (Signed)
PT C/O A MIGRAINE X4 DAYS WITH NAUSEA. PT STS HER HEADS FEELS "HEAVY" IF SHE HAS SOMETHING INSIDE OF IT WITH SENSITIVITY TO LIGHT AND BLURRED VISION.  DENIES INJURY.

## 2016-10-17 NOTE — Discharge Instructions (Signed)
Please take the Fioricet and ibuprofen as directed. Also, please see the contact information to establish care with a primary care provider and [facility] for management of your chronic migraines.

## 2016-10-17 NOTE — Progress Notes (Signed)
Patient listed as having Medicaid insurance without a pcp.  Pcp listed on patient's insurance card is located at [facility].  EDCM spoke to patient at bedside. Patient reports she has never been to [facility] because it is too far for her.  She reports she doesn't know how to change the doctor on her Medicaid card.  [facility] providers [place], contact information for DSS and contact information for Medicaid transportation services.  EDCM instructed patient to call the DSS to change to doctor on her insurance card.  Patient verbalized understanding and is thankful for services.  No further EDCM needs at this time.

## 2016-12-31 ENCOUNTER — Encounter: Payer: Self-pay | Admitting: Neurology

## 2016-12-31 ENCOUNTER — Ambulatory Visit (INDEPENDENT_AMBULATORY_CARE_PROVIDER_SITE_OTHER): Payer: Medicaid Other | Admitting: Neurology

## 2016-12-31 VITALS — BP 118/78 | HR 70 | Ht 61.0 in | Wt 162.1 lb

## 2016-12-31 DIAGNOSIS — G43009 Migraine without aura, not intractable, without status migrainosus: Secondary | ICD-10-CM | POA: Diagnosis not present

## 2016-12-31 MED ORDER — IBUPROFEN 800 MG PO TABS: 800.0000 mg | ORAL_TABLET | Freq: Three times a day (TID) | ORAL | 2 refills | Status: DC | PRN

## 2016-12-31 MED ORDER — ATENOLOL 25 MG PO TABS: 25.0000 mg | ORAL_TABLET | Freq: Every day | ORAL | 2 refills | Status: DC

## 2016-12-31 MED ORDER — TIZANIDINE HCL 2 MG PO TABS: 2.0000 mg | ORAL_TABLET | Freq: Three times a day (TID) | ORAL | 0 refills | Status: DC | PRN

## 2016-12-31 NOTE — Patient Instructions (Signed)
Migraine Recommendations: 1.  Start atenolol 25mg  daily.  Call in 4 weeks with update and we can adjust dose if needed.  Caution for lightheadedness, as it may lower blood pressure or heart rate. 2.  Take ibuprofen 800mg  at earliest onset of headache.  May repeat every 8 hours.  Limit use to no more than 2 days out of the week to prevent rebound headache.  Stop butalbital-acetaminophen-caffeine pill. 3.  Take tizanidine 2mg  every 8 hours as needed.  It may cause drowsiness, so be careful.  I would take only at home or at bedtime to see how you respond.  Do not drive if taken. 4.  Be aware of common food triggers such as processed sweets, processed foods with nitrites (such as deli meat, hot dogs, sausages), foods with MSG, alcohol (such as wine), chocolate, certain cheeses, certain fruits (dried fruits, some citrus fruit), vinegar, diet soda. 4.  Avoid caffeine 5.  Routine exercise 6.  Proper sleep hygiene 7.  Stay adequately hydrated with water 8.  Keep a headache diary. 9.  Maintain proper stress management. 10.  Do not skip meals. 11.  Consider supplements:  Magnesium citrate 400mg  to 600mg  daily, riboflavin 400mg , Coenzyme Q 10 100mg  three times daily 12.  Follow up in 3 months but contact me in 4 weeks with update.

## 2016-12-31 NOTE — Addendum Note (Signed)
Addended by: Doree BarthelLOWE, Tabor Denham on: 12/31/2016 08:51 AM   Modules accepted: Orders

## 2016-12-31 NOTE — Progress Notes (Signed)
NEUROLOGY CONSULTATION NOTE  Jessica Horton MRN: [id number] DOB: [birth year]  Referring provider: Urgent care referral Primary care provider: no PCP  Reason for consult:  headaches  HISTORY OF PRESENT ILLNESS: Jessica Horton is a 26 year old female with asthma who presents for migraines.  History, including semiology of headache, supplemented by ED note.  Onset:  Since age 69.  Typical migraine more frequent over past 1 to 2 months. Location:  Left sided (usually frontal and occipital), radiating down left side of neck and into shoulder (neck pain may precede headache). Quality:  Pounding.  Sometimes with shooting pain from left frontal to occipital region.  May wake up with headache (but headache does not wake her up). Intensity:  9-10/10 Aura:  no Prodrome:  No, but feels head heaviness afterwards. Associated symptoms:  Nausea, photophobia, phonophobia, eye tears, left side of head and neck feels "hot".  Sometimes notes blurred vision. Duration:  30 to 45 minutes with ibuprofen.  Otherwise hours Frequency:  Every other day (about once every 1 to 2 weeks prior to 1-2 months ago) Triggers/exacerbating factors:  Cold, quick head movements Relieving factors:  Pressure over the head Activity:  Needs to lay down  Past NSAIDS:  no Past analgesics:  Tylenol Past abortive triptans:  no Past muscle relaxants:  no Past anti-emetic:  no Past antihypertensive medications:  no Past antidepressant medications:  no Past anticonvulsant medications:  no Past vitamins/Herbal/Supplements:  no Past antihistamines/decongestants:  no Other past therapies:  no  Current NSAIDS:  ibuprofen Current analgesics:  Fioricet Current triptans:  no Current anti-emetic:  no Current muscle relaxants:  no Current anti-anxiolytic:  no Current sleep aide:  no Current Antihypertensive medications:  no Current Antidepressant medications:  no Current Anticonvulsant medications:  no Current  Vitamins/Herbal/Supplements:  no Current Antihistamines/Decongestants:  Flonase Other therapy:  no Other medication:  no  Caffeine:  No coffee Alcohol:  no Smoker:  no Diet:  Hydrates.  Drinks soda Exercise:  yes Depression/stress:  Stable.  Stress related to school Sleep hygiene:  Varies.  Inconsistent sleep related to school Family history of headache:  No.  No family history of cerebral aneurysm.  Urine pregnancy test from 10/17/16 was negative.  PAST MEDICAL HISTORY: Past Medical History:  Diagnosis Date  . Asthma   . Asthma   . Headache(784.0)   . Migraine     PAST SURGICAL HISTORY: Past Surgical History:  Procedure Laterality Date  . CESAREAN SECTION N/A 01/24/2014   Procedure: CESAREAN SECTION;  Surgeon: Esmeralda Arthur, MD;  Location: [facility];  Service: Obstetrics;  Laterality: N/A;  . WISDOM TOOTH EXTRACTION      MEDICATIONS: Current Outpatient Prescriptions on File Prior to Visit  Medication Sig Dispense Refill  . albuterol (PROVENTIL HFA;VENTOLIN HFA) 108 (90 Base) MCG/ACT inhaler Inhale 2 puffs into the lungs every 6 (six) hours as needed (asthma). 1 Inhaler 1  . butalbital-acetaminophen-caffeine (FIORICET, ESGIC) 50-325-40 MG tablet Take 1 tablet by mouth every 6 (six) hours as needed for headache. 30 tablet 0  . fluticasone (FLOVENT HFA) 110 MCG/ACT inhaler Inhale 2 puffs into the lungs 2 (two) times daily. 1 Inhaler 1   No current facility-administered medications on file prior to visit.     ALLERGIES: No Known Allergies  FAMILY HISTORY: Family History  Problem Relation Age of Onset  . Diabetes Mother     SOCIAL HISTORY: Social History   Social History  . Marital status: Single    Spouse  name: N/A  . Number of children: N/A  . Years of education: N/A   Occupational History  . Not on file.   Social History Main Topics  . Smoking status: Never Smoker  . Smokeless tobacco: Never Used  . Alcohol use No  . Drug use: No  . Sexual activity:  Yes    Birth control/ protection: None   Other Topics Concern  . Not on file   Social History Narrative  . No narrative on file    REVIEW OF SYSTEMS: Constitutional: No fevers, chills, or sweats, no generalized fatigue, change in appetite Eyes: Sometimes brief blurred vision.  No double vision, eye pain Ear, nose and throat: No hearing loss, ear pain, nasal congestion, sore throat Cardiovascular: No chest pain, palpitations Respiratory:  No shortness of breath at rest or with exertion, wheezes GastrointestinaI: No nausea, vomiting, diarrhea, abdominal pain, fecal incontinence Genitourinary:  No dysuria, urinary retention or frequency Musculoskeletal:  Sometimes neck pain. Integumentary: No rash, pruritus, skin lesions Neurological: as above Psychiatric: No depression, insomnia, anxiety Endocrine: No palpitations, fatigue, diaphoresis, mood swings, change in appetite, change in weight, increased thirst Hematologic/Lymphatic:  No purpura, petechiae. Allergic/Immunologic: no itchy/runny eyes, nasal congestion, recent allergic reactions, rashes  PHYSICAL EXAM: Vitals:   12/31/16 0757  BP: 118/78  Pulse: 70   General: No acute distress.  Patient appears well-groomed.  Head:  Normocephalic/atraumatic Eyes:  fundi examined but not visualized Neck: supple, no paraspinal tenderness, full range of motion Back: No paraspinal tenderness Heart: regular rate and rhythm Lungs: Clear to auscultation bilaterally. Vascular: No carotid bruits. Neurological Exam: Mental status: alert and oriented to person, place, and time, recent and remote memory intact, fund of knowledge intact, attention and concentration intact, speech fluent and not dysarthric, language intact. Cranial nerves: CN I: not tested CN II: pupils equal, round and reactive to light, visual fields intact CN III, IV, VI:  full range of motion, no nystagmus, no ptosis CN V: facial sensation intact CN VII: upper and lower face  symmetric CN VIII: hearing intact CN IX, X: gag intact, uvula midline CN XI: sternocleidomastoid and trapezius muscles intact CN XII: tongue midline Bulk & Tone: normal, no fasciculations. Motor:  5/5 throughout  Sensation: temperature and vibration sensation intact. Deep Tendon Reflexes:  2+ throughout, toes downgoing.  Finger to nose testing:  Without dysmetria.  Heel to shin:  Without dysmetria.  Gait:  Normal station and stride.  Able to turn and tandem walk. Romberg negative.  IMPRESSION: Her typical migraine without aura.  Increased frequency may be due to atypical cold temperatures.  Neurologic exam is normal.  PLAN: 1.  Will initiate atenolol 25mg  daily (will not prescribe propranolol due to asthma history).  She will contact me in 4 weeks with update. 2.  Continue ibuprofen 800mg  for abortive therapy, limited to no more than 2 days out of the week.  Stop Fioricet.   3.  Tizanidine 2mg  for neck pain 4.  Discussed lifestyle modification and supplements 5.  I want her to have her eyes/vision examined. 6.  Follow up in 3 months.  Thank you for allowing me to take part in the care of this patient.  Shon MilletAdam Jaffe, DO

## 2017-01-09 ENCOUNTER — Other Ambulatory Visit: Payer: Self-pay | Admitting: Pediatrics

## 2017-01-09 DIAGNOSIS — Z20828 Contact with and (suspected) exposure to other viral communicable diseases: Secondary | ICD-10-CM

## 2017-01-09 MED ORDER — OSELTAMIVIR PHOSPHATE 75 MG PO CAPS: 75.0000 mg | ORAL_CAPSULE | Freq: Every day | ORAL | 0 refills | Status: DC

## 2017-01-09 NOTE — Progress Notes (Signed)
Sister Flu A +. prophy for pt with asthma

## 2017-02-08 ENCOUNTER — Ambulatory Visit (INDEPENDENT_AMBULATORY_CARE_PROVIDER_SITE_OTHER): Payer: Medicaid Other

## 2017-02-08 ENCOUNTER — Encounter (HOSPITAL_COMMUNITY): Payer: Self-pay | Admitting: *Deleted

## 2017-02-08 ENCOUNTER — Ambulatory Visit (HOSPITAL_COMMUNITY)
Admission: EM | Admit: 2017-02-08 | Discharge: 2017-02-08 | Disposition: A | Discharge status: Home / Self Care | Payer: Medicaid Other | Attending: Radiology | Admitting: Radiology

## 2017-02-08 DIAGNOSIS — S60222A Contusion of left hand, initial encounter: Secondary | ICD-10-CM | POA: Diagnosis not present

## 2017-02-08 MED ORDER — NAPROXEN 500 MG PO TABS: 500.0000 mg | ORAL_TABLET | Freq: Two times a day (BID) | ORAL | 0 refills | Status: DC

## 2017-02-08 NOTE — Discharge Instructions (Signed)
Continue to rest, ice and elevate affected extremity.

## 2017-02-08 NOTE — ED Triage Notes (Signed)
Pt   Reports   She   Slammed  l  Hand  On  [place]    Counter       She  Has  Pain  And     Reports  decresed   Movement   And  Pain  l  Middle  And  l  Ring  Fingers  She  Did  This   yesterday

## 2017-02-08 NOTE — ED Provider Notes (Signed)
CSN: [id number]     Arrival date & time 02/08/17  1621 History   None    Chief Complaint  Patient presents with  . Hand Injury   (Consider location/radiation/quality/duration/timing/severity/associated sxs/prior Treatment) 26 y.o. female presents with injuries to left hand X 2 days. Patient  States that she was playing when she hit a granita counter top and hurt her hand Condition is acute in nature. Patient states that the pain radiates on the ulnar side up her elbow. Patient has full range of motion but states that there is increase pain with movement.  Condition is made better by nothing. Condition is made worse by nothing. Patient denies any treatment prior to arrival at this facility. Affected extremity pulses are intact with cap refill < 2 and warm skin       Past Medical History:  Diagnosis Date  . Asthma   . Asthma   . Headache(784.0)   . Migraine    Past Surgical History:  Procedure Laterality Date  . CESAREAN SECTION N/A 01/24/2014   Procedure: CESAREAN SECTION;  Surgeon: Esmeralda ArthurSandra A Rivard, MD;  Location: [facility];  Service: Obstetrics;  Laterality: N/A;  . WISDOM TOOTH EXTRACTION     Family History  Problem Relation Age of Onset  . Diabetes Mother    Social History  Substance Use Topics  . Smoking status: Never Smoker  . Smokeless tobacco: Never Used  . Alcohol use No   OB History    Gravida Para Term Preterm AB Living   1 1 1  0 0 1   SAB TAB Ectopic Multiple Live Births   0 0 0 0 1     Review of Systems  Constitutional: Negative for chills and fever.  HENT: Negative for ear pain and sore throat.   Eyes: Negative for pain and visual disturbance.  Respiratory: Negative for cough and shortness of breath.   Cardiovascular: Negative for chest pain and palpitations.  Gastrointestinal: Negative for abdominal pain and vomiting.  Genitourinary: Negative for dysuria and hematuria.  Musculoskeletal: Positive for arthralgias ( pain to left hand). Negative for back  pain.  Skin: Negative for color change and rash.  Neurological: Negative for seizures and syncope.  All other systems reviewed and are negative.   Allergies  Patient has no known allergies.  Home Medications   Prior to Admission medications   Medication Sig Start Date End Date Taking? Authorizing Provider  albuterol (PROVENTIL HFA;VENTOLIN HFA) 108 (90 Base) MCG/ACT inhaler Inhale 2 puffs into the lungs every 6 (six) hours as needed (asthma). 03/04/16   Tiffany Neva SeatGreene, PA-C  atenolol (TENORMIN) 25 MG tablet Take 1 tablet (25 mg total) by mouth daily. 12/31/16   Drema DallasAdam R Jaffe, DO  butalbital-acetaminophen-caffeine (FIORICET, ESGIC) [phone] MG tablet Take 1 tablet by mouth every 6 (six) hours as needed for headache. 10/17/16 10/17/17  Benjiman CoreNathan Pickering, MD  fluticasone (FLOVENT HFA) 110 MCG/ACT inhaler Inhale 2 puffs into the lungs 2 (two) times daily. 03/04/16   Tiffany Neva SeatGreene, PA-C  ibuprofen (ADVIL,MOTRIN) 800 MG tablet Take 1 tablet (800 mg total) by mouth every 8 (eight) hours as needed. 12/31/16   Drema DallasAdam R Jaffe, DO  naproxen (NAPROSYN) 500 MG tablet Take 1 tablet (500 mg total) by mouth 2 (two) times daily. 02/08/17   Alene MiresJennifer C Takerra Lupinacci, NP  oseltamivir (TAMIFLU) 75 MG capsule Take 1 capsule (75 mg total) by mouth daily. 01/09/17   Clint GuyEsther P Smith, MD  tiZANidine (ZANAFLEX) 2 MG tablet Take 1 tablet (2 mg total)  by mouth every 8 (eight) hours as needed for muscle spasms. 12/31/16   Drema Dallas, DO   Meds Ordered and Administered this Visit  Medications - No data to display  BP 100/70 (BP Location: Left Arm)   Pulse 78   Temp 98.6 F (37 C) (Oral)   Resp 16   LMP 02/06/2017   SpO2 100%   Breastfeeding? No  No data found.   Physical Exam  Constitutional: She is oriented to person, place, and time. She appears well-developed and well-nourished.  HENT:  Head: Normocephalic and atraumatic.  Eyes: Conjunctivae are normal.  Neck: Normal range of motion.  Pulmonary/Chest: Effort normal.   Musculoskeletal: She exhibits edema and tenderness.  Neurological: She is alert and oriented to person, place, and time.  Skin: Skin is warm and dry.  Psychiatric: She has a normal mood and affect.  Nursing note and vitals reviewed.   Urgent Care Course     Procedures (including critical care time)  Labs Review Labs Reviewed - No data to display  Imaging Review  DG left hand complete  FINDINGS: There is no evidence of fracture or dislocation. There is no evidence of arthropathy or other focal bone abnormality. Soft tissues are unremarkable.  IMPRESSION: Negative.      MDM   1. Contusion of left hand, initial encounter        Alene Mires, NP 02/08/17 2024    Alene Mires, NP 02/14/17 [phone]

## 2017-02-08 NOTE — Progress Notes (Signed)
Orthopedic Tech Progress Note Patient Details:  Jessica DareFadoua Wahbi El Alaoui [birth year] [id number]  Ortho Devices Type of Ortho Device: Ace wrap, Ulna gutter splint Ortho Device/Splint Interventions: Application   Saul FordyceJennifer C Melyssa Signor 02/08/2017, 7:06 PM

## 2017-04-03 ENCOUNTER — Ambulatory Visit: Payer: Medicaid Other | Admitting: Neurology

## 2017-04-03 ENCOUNTER — Encounter: Payer: Self-pay | Admitting: Neurology

## 2017-04-09 ENCOUNTER — Other Ambulatory Visit: Payer: Self-pay | Admitting: Neurology

## 2017-10-30 ENCOUNTER — Ambulatory Visit: Payer: Self-pay | Admitting: Neurology

## 2018-02-04 ENCOUNTER — Encounter: Payer: Self-pay | Admitting: *Deleted

## 2018-03-02 ENCOUNTER — Encounter: Payer: Self-pay | Admitting: Medical

## 2018-03-02 ENCOUNTER — Other Ambulatory Visit (HOSPITAL_COMMUNITY)
Admission: RE | Admit: 2018-03-02 | Discharge: 2018-03-02 | Disposition: A | Discharge status: Home / Self Care | Payer: Medicaid Other | Source: Ambulatory Visit | Attending: Medical | Admitting: Medical

## 2018-03-02 ENCOUNTER — Ambulatory Visit: Payer: Medicaid Other | Admitting: Medical

## 2018-03-02 VITALS — BP 110/78 | HR 68 | Wt 170.0 lb

## 2018-03-02 DIAGNOSIS — Z30432 Encounter for removal of intrauterine contraceptive device: Secondary | ICD-10-CM

## 2018-03-02 DIAGNOSIS — Z01419 Encounter for gynecological examination (general) (routine) without abnormal findings: Secondary | ICD-10-CM

## 2018-03-02 DIAGNOSIS — Z0001 Encounter for general adult medical examination with abnormal findings: Secondary | ICD-10-CM | POA: Diagnosis not present

## 2018-03-02 DIAGNOSIS — Z3009 Encounter for other general counseling and advice on contraception: Secondary | ICD-10-CM

## 2018-03-02 MED ORDER — NORETHIN ACE-ETH ESTRAD-FE 1-20 MG-MCG(24) PO TABS: 1.0000 | ORAL_TABLET | Freq: Every day | ORAL | 11 refills | Status: DC

## 2018-03-02 NOTE — Patient Instructions (Signed)
Oral Contraception Information Oral contraceptive pills (OCPs) are medicines taken to prevent pregnancy. OCPs work by preventing the ovaries from releasing eggs. The hormones in OCPs also cause the cervical mucus to thicken, preventing the sperm from entering the uterus. The hormones also cause the uterine lining to become thin, not allowing a fertilized egg to attach to the inside of the uterus. OCPs are highly effective when taken exactly as prescribed. However, OCPs do not prevent sexually transmitted diseases (STDs). Safe sex practices, such as using condoms along with the pill, can help prevent STDs. Before taking the pill, you may have a physical exam and Pap test. Your health care provider may order blood tests. The health care provider will make sure you are a good candidate for oral contraception. Discuss with your health care provider the possible side effects of the OCP you may be prescribed. When starting an OCP, it can take 2 to 3 months for the body to adjust to the changes in hormone levels in your body. Types of oral contraception  The combination pill-This pill contains estrogen and progestin (synthetic progesterone) hormones. The combination pill comes in 21-day, 28-day, or 91-day packs. Some types of combination pills are meant to be taken continuously (365-day pills). With 21-day packs, you do not take pills for 7 days after the last pill. With 28-day packs, the pill is taken every day. The last 7 pills are without hormones. Certain types of pills have more than 21 hormone-containing pills. With 91-day packs, the first 84 pills contain both hormones, and the last 7 pills contain no hormones or contain estrogen only.  The minipill-This pill contains the progesterone hormone only. The pill is taken every day continuously. It is very important to take the pill at the same time each day. The minipill comes in packs of 28 pills. All 28 pills contain the hormone. Advantages of oral  contraceptive pills  Decreases premenstrual symptoms.  Treats menstrual period cramps.  Regulates the menstrual cycle.  Decreases a heavy menstrual flow.  May treatacne, depending on the type of pill.  Treats abnormal uterine bleeding.  Treats polycystic ovarian syndrome.  Treats endometriosis.  Can be used as emergency contraception. Things that can make oral contraceptive pills less effective OCPs can be less effective if:  You forget to take the pill at the same time every day.  You have a stomach or intestinal disease that lessens the absorption of the pill.  You take OCPs with other medicines that make OCPs less effective, such as antibiotics, certain HIV medicines, and some seizure medicines.  You take expired OCPs.  You forget to restart the pill on day 7, when using the packs of 21 pills.  Risks associated with oral contraceptive pills Oral contraceptive pills can sometimes cause side effects, such as:  Headache.  Nausea.  Breast tenderness.  Irregular bleeding or spotting.  Combination pills are also associated with a small increased risk of:  Blood clots.  Heart attack.  Stroke.  This information is not intended to replace advice given to you by your health care provider. Make sure you discuss any questions you have with your health care provider. Document Released: 03/08/2003 Document Revised: 05/23/2016 Document Reviewed: 06/06/2013 Elsevier Interactive Patient Education  2018 [employer] Inc[employer]  

## 2018-03-02 NOTE — Progress Notes (Signed)
Subjective:    Jessica Horton is a 27 y.o. female who presents for an annual exam. The patient would like her IUD removed today. The patient is sexually active. GYN screening history: last pap: approximate date 2015 and was normal. The patient wears seatbelts: yes. The patient participates in regular exercise: no. Has the patient ever been transfused or tattooed?: not asked. The patient reports that there is not domestic violence in her life.   Menstrual History: OB History    Gravida Para Term Preterm AB Living   1 1 1  0 0 1   SAB TAB Ectopic Multiple Live Births   0 0 0 0 1       Patient's last menstrual period was 02/23/2018. Period Cycle (Days): (irreg cycles ) Period Duration (Days): 6-7days Period Pattern: (!) Irregular Menstrual Flow: Light Dysmenorrhea: (!) Mild Dysmenorrhea Symptoms: Cramping  The following portions of the patient's history were reviewed and updated as appropriate: allergies, current medications, past family history, past medical history, past social history, past surgical history and problem list.  Review of Systems Pertinent items are noted in HPI.    Objective:   Physical Exam  Nursing note and vitals reviewed. Constitutional: She is oriented to person, place, and time. She appears well-developed and well-nourished. No distress.  HENT:  Head: Normocephalic and atraumatic.  Cardiovascular: Normal rate, regular rhythm and normal heart sounds.  No murmur heard. Respiratory: Effort normal and breath sounds normal. No respiratory distress. She has no wheezes.  GI: Soft. Bowel sounds are normal. She exhibits no distension and no mass. There is no tenderness. There is no rebound and no guarding.  Genitourinary: Uterus is not enlarged and not tender. Cervix exhibits no motion tenderness, no discharge and no friability. Right adnexum displays no mass and no tenderness. Left adnexum displays no mass and no tenderness. No bleeding in the vagina. Vaginal  discharge (scant mucous) found.  Neurological: She is alert and oriented to person, place, and time.  Skin: Skin is warm and dry. No erythema.  Psychiatric: She has a normal mood and affect.   GYNECOLOGY CLINIC PROCEDURE NOTE  IUD Removal  Patient was in the dorsal lithotomy position, normal external genitalia was noted.  A speculum was placed in the patient's vagina, normal discharge was noted, no lesions. The multiparous cervix was visualized, no lesions, no abnormal discharge.  The strings of the IUD were grasped and pulled using ring forceps. The IUD was removed in its entirety. Patient tolerated the procedure well.    Patient will use OCPs for contraception.  Routine preventative health maintenance measures emphasized.  Discussed at length concern for OCPs with history of migraine headaches. Patient is to monitor migraines for worsening symptoms and call to change birth control if needed.    Assessment:    Healthy female exam.   Birth control counseling   Plan:     Await pap smear results.   Rx for Lo Estrin Fe sent to patient's pharmacy Patient to monitor migraines and report back in 3-6 months Patient to return to [facility] in 1 year for annual exam or sooner PRN  Kathlene CoteWenzel, Kyon Bentler N, PA-C 03/02/2018 6:34 PM

## 2018-03-04 LAB — CYTOLOGY - PAP
Chlamydia: NEGATIVE
DIAGNOSIS: NEGATIVE
Neisseria Gonorrhea: NEGATIVE

## 2018-03-06 ENCOUNTER — Encounter: Payer: Self-pay | Admitting: *Deleted

## 2018-07-04 ENCOUNTER — Emergency Department (HOSPITAL_COMMUNITY): Payer: Medicaid Other

## 2018-07-04 ENCOUNTER — Emergency Department (HOSPITAL_COMMUNITY)
Admission: EM | Admit: 2018-07-04 | Discharge: 2018-07-04 | Disposition: A | Discharge status: Home / Self Care | Payer: Medicaid Other | Attending: Emergency Medicine | Admitting: Emergency Medicine

## 2018-07-04 ENCOUNTER — Encounter (HOSPITAL_COMMUNITY): Payer: Self-pay | Admitting: Emergency Medicine

## 2018-07-04 ENCOUNTER — Other Ambulatory Visit: Payer: Self-pay

## 2018-07-04 DIAGNOSIS — R079 Chest pain, unspecified: Secondary | ICD-10-CM | POA: Diagnosis present

## 2018-07-04 DIAGNOSIS — Z79899 Other long term (current) drug therapy: Secondary | ICD-10-CM | POA: Diagnosis not present

## 2018-07-04 DIAGNOSIS — J45909 Unspecified asthma, uncomplicated: Secondary | ICD-10-CM | POA: Insufficient documentation

## 2018-07-04 LAB — HEPATIC FUNCTION PANEL
ALK PHOS: 65 U/L (ref 38–126)
ALT: 40 U/L (ref 0–44)
AST: 34 U/L (ref 15–41)
Albumin: 3.9 g/dL (ref 3.5–5.0)
BILIRUBIN TOTAL: 0.6 mg/dL (ref 0.3–1.2)
Bilirubin, Direct: 0.1 mg/dL (ref 0.0–0.2)
Indirect Bilirubin: 0.5 mg/dL (ref 0.3–0.9)
Total Protein: 7.6 g/dL (ref 6.5–8.1)

## 2018-07-04 LAB — BASIC METABOLIC PANEL
ANION GAP: 12 (ref 5–15)
BUN: 12 mg/dL (ref 6–20)
CALCIUM: 9.4 mg/dL (ref 8.9–10.3)
CO2: 21 mmol/L — ABNORMAL LOW (ref 22–32)
Chloride: 106 mmol/L (ref 98–111)
Creatinine, Ser: 0.56 mg/dL (ref 0.44–1.00)
GFR calc Af Amer: >60 mL/min (ref >60)
Glucose, Bld: 121 mg/dL — ABNORMAL HIGH (ref 70–99)
Potassium: 3.3 mmol/L — ABNORMAL LOW (ref 3.5–5.1)
SODIUM: 139 mmol/L (ref 135–145)

## 2018-07-04 LAB — CBC
HCT: 37.6 % (ref 36.0–46.0)
HEMOGLOBIN: 12.3 g/dL (ref 12.0–15.0)
MCH: 29.1 pg (ref 26.0–34.0)
MCHC: 32.7 g/dL (ref 30.0–36.0)
MCV: 88.9 fL (ref 78.0–100.0)
Platelets: 288 10*3/uL (ref 150–400)
RBC: 4.23 MIL/uL (ref 3.87–5.11)
RDW: 12.8 % (ref 11.5–15.5)
WBC: 11.6 10*3/uL — AB (ref 4.0–10.5)

## 2018-07-04 LAB — I-STAT TROPONIN, ED: TROPONIN I, POC: 0 ng/mL (ref 0.00–0.08)

## 2018-07-04 LAB — D-DIMER, QUANTITATIVE: D-Dimer, Quant: 1.04 ug/mL-FEU — ABNORMAL HIGH (ref 0.00–0.50)

## 2018-07-04 LAB — I-STAT BETA HCG BLOOD, ED (MC, WL, AP ONLY)

## 2018-07-04 LAB — LIPASE, BLOOD: Lipase: 28 U/L (ref 11–51)

## 2018-07-04 MED ORDER — IBUPROFEN 800 MG PO TABS: 800.0000 mg | ORAL_TABLET | Freq: Three times a day (TID) | ORAL | 0 refills | Status: DC

## 2018-07-04 MED ORDER — GI COCKTAIL ~~LOC~~
30.0000 mL | Freq: Once | ORAL | Status: AC
  Administered 2018-07-04: 30 mL via ORAL
  Filled 2018-07-04: qty 30

## 2018-07-04 MED ORDER — IOPAMIDOL (ISOVUE-370) INJECTION 76%
100.0000 mL | Freq: Once | INTRAVENOUS | Status: AC | PRN
  Administered 2018-07-04: 100 mL via INTRAVENOUS

## 2018-07-04 MED ORDER — IOPAMIDOL (ISOVUE-370) INJECTION 76%
INTRAVENOUS | Status: AC
  Filled 2018-07-04: qty 100

## 2018-07-04 NOTE — ED Provider Notes (Signed)
[facility]-EMERGENCY DEPT Provider Note   CSN: [id number] Arrival date & time: 07/04/18  0123     History   Chief Complaint Chief Complaint  Patient presents with  . Chest Pain    HPI Jessica Horton is a 27 y.o. female.  The history is provided by the patient and medical records.  Chest Pain      27 year old female with history of asthma, headaches, migraines, presenting to the ED with chest pain.  Patient states this evening she took her birth control and was about to feed her son when she developed central chest pain which radiated beneath her left breast and down the left arm.  States it was associated with a "cold" sensation.  She denies any shortness of breath, diaphoresis, nausea, vomiting, dizziness, or weakness.  States whenever she talks in breathing and a lot of cold air her pain seems to get worse.  She denies any known cardiac history.  She is not a smoker.  No significant family cardiac history.  She denies history of DVT or PE.  She does take OCPs.  No recent long distance travel, surgery, trauma, or prolonged immobilization.  No intervention tried prior to arrival.  Past Medical History:  Diagnosis Date  . Asthma   . Asthma   . Headache(784.0)   . Migraine     Patient Active Problem List   Diagnosis Date Noted  . Cesarean delivery delivered 01/24/2014  . Asthma, chronic 01/22/2014  . Migraines 01/22/2014    Past Surgical History:  Procedure Laterality Date  . CESAREAN SECTION N/A 01/24/2014   Procedure: CESAREAN SECTION;  Surgeon: Esmeralda Arthur, MD;  Location: [facility];  Service: Obstetrics;  Laterality: N/A;  . WISDOM TOOTH EXTRACTION       OB History    Gravida  1   Para  1   Term  1   Preterm  0   AB  0   Living  1     SAB  0   TAB  0   Ectopic  0   Multiple  0   Live Births  1            Home Medications    Prior to Admission medications   Medication Sig Start Date End Date Taking?  Authorizing Provider  albuterol (PROVENTIL HFA;VENTOLIN HFA) 108 (90 Base) MCG/ACT inhaler Inhale 2 puffs into the lungs every 6 (six) hours as needed (asthma). 03/04/16  Yes Neva Seat, Tiffany, PA-C  fluticasone (FLOVENT HFA) 110 MCG/ACT inhaler Inhale 2 puffs into the lungs 2 (two) times daily. 03/04/16  Yes Neva Seat, Tiffany, PA-C  Norethindrone Acetate-Ethinyl Estrad-FE (LOESTRIN 24 FE) 1-20 MG-MCG(24) tablet Take 1 tablet by mouth daily. 03/02/18  Yes Marny Lowenstein, PA-C  atenolol (TENORMIN) 25 MG tablet Take 1 tablet (25 mg total) by mouth daily. Patient not taking: Reported on 03/02/2018 12/31/16   Drema Dallas, DO    Family History Family History  Problem Relation Age of Onset  . Diabetes Mother     Social History Social History   Tobacco Use  . Smoking status: Never Smoker  . Smokeless tobacco: Never Used  Substance Use Topics  . Alcohol use: No  . Drug use: No     Allergies   Patient has no known allergies.   Review of Systems Review of Systems  Cardiovascular: Positive for chest pain.  All other systems reviewed and are negative.    Physical Exam Updated Vital Signs  BP 107/71   Pulse 69   Temp 98.4 F (36.9 C) (Oral)   Resp 19   Ht 5\' 1"  (1.549 m)   Wt 77.1 kg (170 lb)   LMP 06/30/2018   SpO2 97%   BMI 32.12 kg/m   Physical Exam  Constitutional: She is oriented to person, place, and time. She appears well-developed and well-nourished.  HENT:  Head: Normocephalic and atraumatic.  Mouth/Throat: Oropharynx is clear and moist.  Eyes: Pupils are equal, round, and reactive to light. Conjunctivae and EOM are normal.  Neck: Normal range of motion.  Cardiovascular: Normal rate, regular rhythm and normal heart sounds.  Pulmonary/Chest: Effort normal and breath sounds normal. She has no decreased breath sounds. She has no wheezes. She has no rales.  Chest wall non-tender; lungs clear  Abdominal: Soft. Bowel sounds are normal.  Musculoskeletal: Normal range of motion.   Neurological: She is alert and oriented to person, place, and time.  Skin: Skin is warm and dry.  Psychiatric: She has a normal mood and affect.  Nursing note and vitals reviewed.    ED Treatments / Results  Labs (all labs ordered are listed, but only abnormal results are displayed) Labs Reviewed  BASIC METABOLIC PANEL - Abnormal; Notable for the following components:      Result Value   Potassium 3.3 (*)    CO2 21 (*)    Glucose, Bld 121 (*)    All other components within normal limits  CBC - Abnormal; Notable for the following components:   WBC 11.6 (*)    All other components within normal limits  D-DIMER, QUANTITATIVE (NOT AT [facility]) - Abnormal; Notable for the following components:   D-Dimer, Quant 1.04 (*)    All other components within normal limits  HEPATIC FUNCTION PANEL  LIPASE, BLOOD  I-STAT TROPONIN, ED  I-STAT BETA HCG BLOOD, ED ([facility], [facility], [facility] ONLY)    EKG EKG Interpretation  Date/Time:  Saturday July 04 2018 01:29:51 EDT Ventricular Rate:  89 PR Interval:    QRS Duration: 95 QT Interval:  378 QTC Calculation: 460 R Axis:   63 Text Interpretation:  Sinus rhythm No significant change since last tracing Confirmed by Shaune PollackIsaacs, Cameron [phone]([id number]) on 07/04/2018 6:50:39 AM   Radiology Dg Chest 2 View  Result Date: 07/04/2018 CLINICAL DATA:  Left chest pain starting 1 hour ago. Shortness of breath. History of asthma. EXAM: CHEST - 2 VIEW COMPARISON:  None. FINDINGS: The heart size and mediastinal contours are within normal limits. Both lungs are clear. The visualized skeletal structures are unremarkable. IMPRESSION: No active cardiopulmonary disease. Electronically Signed   By: Burman NievesWilliam  Stevens M.D.   On: 07/04/2018 02:46   Ct Angio Chest Pe W And/or Wo Contrast  Result Date: 07/04/2018 CLINICAL DATA:  27 y/o F; Chest pain, PE suspected, low/intermediate prob, neg D-dimer chest pain, worse with deep breathing, on OCPs. EXAM: CT ANGIOGRAPHY CHEST WITH CONTRAST TECHNIQUE:  Multidetector CT imaging of the chest was performed using the standard protocol during bolus administration of intravenous contrast. Multiplanar CT image reconstructions and MIPs were obtained to evaluate the vascular anatomy. CONTRAST:  100mL ISOVUE-370 IOPAMIDOL (ISOVUE-370) INJECTION 76% COMPARISON:  07/04/2018 chest radiograph. FINDINGS: Cardiovascular: Satisfactory opacification of the pulmonary arteries to the segmental level. No evidence of pulmonary embolism. Normal heart size. No pericardial effusion. Mediastinum/Nodes: No enlarged mediastinal, hilar, or axillary lymph nodes. Thyroid gland, trachea, and esophagus demonstrate no significant findings. Lungs/Pleura: Lungs are clear. No pleural effusion or pneumothorax. Upper Abdomen: No acute  abnormality. Musculoskeletal: No chest wall abnormality. No acute or significant osseous findings. Review of the MIP images confirms the above findings. IMPRESSION: No pulmonary embolus identified.  Unremarkable CTA of the chest. Electronically Signed   By: Mitzi Hansen M.D.   On: 07/04/2018 05:44    Procedures Procedures (including critical care time)  Medications Ordered in ED Medications  gi cocktail (Maalox,Lidocaine,Donnatal) (30 mLs Oral Given 07/04/18 0410)  iopamidol (ISOVUE-370) 76 % injection 100 mL (100 mLs Intravenous Contrast Given 07/04/18 0518)     Initial Impression / Assessment and Plan / ED Course  I have reviewed the triage vital signs and the nursing notes.  Pertinent labs & imaging results that were available during my care of the patient were reviewed by me and considered in my medical decision making (see chart for details).  27 y.o. F here with chest pain.  This began this evening after fixing her son dinner.  States he felt like a "cold sensation" down her left arm.  No shortness of breath, diaphoresis, nausea, or vomiting.  Pain is worse with breathing in cold air.  She has no known cardiac history.  She is not a smoker.   Does take OCPs but denies history of DVT or PE.  EKG is nonischemic.  Chest x-ray is clear.  Screening labs overall reassuring.  D-dimer was added and is positive.  CTA without findings of PE.  At this time, given negative work-up and patient with very limited cardiac risk factors, feel she is stable for discharge home.  We will have her follow-up closely with her primary care doctor.  She was given copies of labs and imaging studies for physician review.  Discussed plan with patient, she acknowledged understanding and agreed with plan of care.  Return precautions given for new or worsening symptoms.  Final Clinical Impressions(s) / ED Diagnoses   Final diagnoses:  Chest pain in adult    ED Discharge Orders        Ordered    ibuprofen (ADVIL,MOTRIN) 800 MG tablet  3 times daily     07/04/18 0639       Garlon Hatchet, PA-C 07/04/18 2956    Shaune Pollack, MD 07/04/18 [phone]

## 2018-07-04 NOTE — ED Notes (Signed)
Lab to add on HPF, lipase and D-dimer

## 2018-07-04 NOTE — ED Triage Notes (Signed)
Pt to ED by POV with complaints of chest pain that is in her central chest that feels like "she is being sucker punched in the chest" Pt states that she feels also shortness of breath. Pt is tachypnea at this moment RR between 25-30. Pt is A&O x 4 and fully ambulatory

## 2018-07-04 NOTE — Discharge Instructions (Signed)
Take the prescribed medication as directed. °Follow-up with your primary care doctor. °Return to the ED for new or worsening symptoms. °

## 2018-07-04 NOTE — ED Triage Notes (Signed)
Patient complaining of left chest pain that started an hour ago. No other symptoms

## 2018-11-21 ENCOUNTER — Encounter (HOSPITAL_COMMUNITY): Payer: Self-pay

## 2018-11-21 ENCOUNTER — Ambulatory Visit (HOSPITAL_COMMUNITY)
Admission: EM | Admit: 2018-11-21 | Discharge: 2018-11-21 | Disposition: A | Discharge status: Home / Self Care | Payer: Medicaid Other | Attending: Family Medicine | Admitting: Family Medicine

## 2018-11-21 DIAGNOSIS — Z833 Family history of diabetes mellitus: Secondary | ICD-10-CM | POA: Diagnosis not present

## 2018-11-21 DIAGNOSIS — Z7951 Long term (current) use of inhaled steroids: Secondary | ICD-10-CM | POA: Diagnosis not present

## 2018-11-21 DIAGNOSIS — N898 Other specified noninflammatory disorders of vagina: Secondary | ICD-10-CM | POA: Diagnosis present

## 2018-11-21 DIAGNOSIS — J069 Acute upper respiratory infection, unspecified: Secondary | ICD-10-CM

## 2018-11-21 DIAGNOSIS — Z79899 Other long term (current) drug therapy: Secondary | ICD-10-CM | POA: Insufficient documentation

## 2018-11-21 DIAGNOSIS — J45909 Unspecified asthma, uncomplicated: Secondary | ICD-10-CM | POA: Insufficient documentation

## 2018-11-21 MED ORDER — NYSTATIN 100000 UNIT/GM EX POWD: Freq: Four times a day (QID) | CUTANEOUS | 0 refills | Status: DC

## 2018-11-21 MED ORDER — CLOTRIMAZOLE-BETAMETHASONE 1-0.05 % EX CREA: 1.0000 "application " | TOPICAL_CREAM | Freq: Two times a day (BID) | CUTANEOUS | 0 refills | Status: DC

## 2018-11-21 MED ORDER — ALBUTEROL SULFATE HFA 108 (90 BASE) MCG/ACT IN AERS: 2.0000 | INHALATION_SPRAY | Freq: Four times a day (QID) | RESPIRATORY_TRACT | 1 refills | Status: DC | PRN

## 2018-11-21 MED ORDER — BENZONATATE 100 MG PO CAPS: 100.0000 mg | ORAL_CAPSULE | Freq: Three times a day (TID) | ORAL | 0 refills | Status: DC | PRN

## 2018-11-21 NOTE — ED Provider Notes (Signed)
[facility]    CSN: [id number] Arrival date & time: 11/21/18  1255     History   Chief Complaint Chief Complaint  Patient presents with  . Medication Refill  . URI  . Vaginitis    HPI Jalah Maggie Schwalbe El [name] is a 27 y.o. female.   HPI  Asthma: Normally well controlled. Recently developed a cough with congestion and nasal drainage. She has had some wheezing and persistent cough. Used all of inhaler and requests a refill today. She has not experienced fever. Currently no SOB, chest tightness, or active wheezing.   Vaginitis: Patient complains of an abnormal vaginal discharge for 1 week. Vaginal symptoms include discharge described as white and itching.Vulvar symptoms include vulvar itching.STI Risk: Very low risk of STD exposureDischarge described as: white.Other associated symptoms: none.Menstrual pattern: She had been bleeding regularly. Contraception: OCP (estrogen/progesterone)       Past Medical History:  Diagnosis Date  . Asthma   . Asthma   . Headache(784.0)   . Migraine     Patient Active Problem List   Diagnosis Date Noted  . Cesarean delivery delivered 01/24/2014  . Asthma, chronic 01/22/2014  . Migraines 01/22/2014    Past Surgical History:  Procedure Laterality Date  . CESAREAN SECTION N/A 01/24/2014   Procedure: CESAREAN SECTION;  Surgeon: Esmeralda Arthur, MD;  Location: [facility];  Service: Obstetrics;  Laterality: N/A;  . WISDOM TOOTH EXTRACTION      OB History    Gravida  1   Para  1   Term  1   Preterm  0   AB  0   Living  1     SAB  0   TAB  0   Ectopic  0   Multiple  0   Live Births  1            Home Medications    Prior to Admission medications   Medication Sig Start Date End Date Taking? Authorizing Provider  albuterol (PROVENTIL HFA;VENTOLIN HFA) 108 (90 Base) MCG/ACT inhaler Inhale 2 puffs into the lungs every 6 (six) hours as needed (asthma). 03/04/16   Marlon Pel, PA-C  atenolol (TENORMIN) 25  MG tablet Take 1 tablet (25 mg total) by mouth daily. Patient not taking: Reported on 03/02/2018 12/31/16   Drema Dallas, DO  fluticasone (FLOVENT HFA) 110 MCG/ACT inhaler Inhale 2 puffs into the lungs 2 (two) times daily. 03/04/16   Marlon Pel, PA-C  ibuprofen (ADVIL,MOTRIN) 800 MG tablet Take 1 tablet (800 mg total) by mouth 3 (three) times daily. 07/04/18   Garlon Hatchet, PA-C  Norethindrone Acetate-Ethinyl Estrad-FE (LOESTRIN 24 FE) 1-20 MG-MCG(24) tablet Take 1 tablet by mouth daily. 03/02/18   Marny Lowenstein, PA-C    Family History Family History  Problem Relation Age of Onset  . Diabetes Mother     Social History Social History   Tobacco Use  . Smoking status: Never Smoker  . Smokeless tobacco: Never Used  Substance Use Topics  . Alcohol use: No  . Drug use: No     Allergies   Patient has no known allergies.   Review of Systems Review of Systems Pertinent negatives listed in HPI Physical Exam Triage Vital Signs ED Triage Vitals  Enc Vitals Group     BP 11/21/18 1348 106/60     Pulse Rate 11/21/18 1348 84     Resp 11/21/18 1348 20     Temp 11/21/18 1348 98 F (36.7 C)  Temp Source 11/21/18 1348 Oral     SpO2 11/21/18 1348 100 %     Weight --      Height --      Head Circumference --      Peak Flow --      Pain Score 11/21/18 1347 4     Pain Loc --      Pain Edu? --      Excl. in GC? --    No data found.  Updated Vital Signs BP 106/60 (BP Location: Right Arm)   Pulse 84   Temp 98 F (36.7 C) (Oral)   Resp 20   LMP 11/08/2018   SpO2 100%   Visual Acuity Right Eye Distance:   Left Eye Distance:   Bilateral Distance:    Right Eye Near:   Left Eye Near:    Bilateral Near:     Physical Exam General appearance: alert, well developed, well nourished, cooperative and in no distress Head: Normocephalic, without obvious abnormality, atraumatic Respiratory: Respirations even and unlabored, normal respiratory rate. No wheezing, abnormal breath  sounds, or respiratory distress. Extremities: No gross deformities Skin: Skin color, texture, turgor normal. No rashes seen  Psych: Appropriate mood and affect. Neurologic: Mental status: Alert, oriented to person, place, and time, thought content appropriate.  UC Treatments / Results  Labs (all labs ordered are listed, but only abnormal results are displayed) Labs Reviewed - No data to display  EKG None  Radiology No results found.  Procedures Procedures (including critical care time)  Medications Ordered in UC Medications - No data to display  Initial Impression / Assessment and Plan / UC Course  I have reviewed the triage vital signs and the nursing notes.  Pertinent labs & imaging results that were available during my care of the patient were reviewed by me and considered in my medical decision making (see chart for details).     Patient presents with a viral illness and symptoms consistent with vaginitis. Recommended conservative treatment with antihistamine, hydration, and albuterol as needed for SOB, wheezing, or persistent cough. Vaginal symptoms and chaffing, will prescribed a topical Lotrisone cream and nystatin powder. Collected a vaginal specimen to rule out any other vaginal etiology as cause of discharge and itching. An After Visit Summary was printed and given to the patient. Precautions discussed. Red flags discussed. Questions invited and answered. They voiced understanding and agreement.   Final Clinical Impressions(s) / UC Diagnoses   Final diagnoses:  Viral upper respiratory tract infection   Discharge Instructions   None    ED Prescriptions    Medication Sig Dispense Auth. Provider   albuterol (PROVENTIL HFA;VENTOLIN HFA) 108 (90 Base) MCG/ACT inhaler Inhale 2 puffs into the lungs every 6 (six) hours as needed (asthma). 1 Inhaler Bing NeighborsHarris, Takuma Cifelli S, FNP   clotrimazole-betamethasone (LOTRISONE) cream Apply 1 application topically 2 (two) times daily.  30 g Bing NeighborsHarris, Fate Caster S, FNP   benzonatate (TESSALON) 100 MG capsule Take 1-2 capsules (100-200 mg total) by mouth 3 (three) times daily as needed for cough. 40 capsule Bing NeighborsHarris, Jago Carton S, FNP   nystatin (MYCOSTATIN/NYSTOP) powder Apply topically 4 (four) times daily. 15 g Bing NeighborsHarris, Shamanda Len S, FNP     Controlled Substance Prescriptions [place] Controlled Substance Registry consulted? Not Applicable   Bing NeighborsHarris, Lilias Lorensen S, FNP 11/21/18 1442

## 2018-11-21 NOTE — ED Triage Notes (Signed)
Pt presents with upper respiratory cold; nasal drainage, congestion, cough and body aches.  Pt also complains of having symptoms of a yeast infection; irritation and itching.  Pt is also needing a refill for her inhaler as she is asthmatic.

## 2018-11-23 ENCOUNTER — Encounter: Payer: Self-pay | Admitting: Student

## 2018-11-23 ENCOUNTER — Other Ambulatory Visit (HOSPITAL_COMMUNITY)
Admission: RE | Admit: 2018-11-23 | Discharge: 2018-11-23 | Disposition: A | Discharge status: Home / Self Care | Payer: Medicaid Other | Source: Ambulatory Visit | Attending: Student | Admitting: Student

## 2018-11-23 ENCOUNTER — Ambulatory Visit (INDEPENDENT_AMBULATORY_CARE_PROVIDER_SITE_OTHER): Payer: Medicaid Other | Admitting: Student

## 2018-11-23 VITALS — BP 132/77 | HR 84 | Wt 172.0 lb

## 2018-11-23 DIAGNOSIS — Z3202 Encounter for pregnancy test, result negative: Secondary | ICD-10-CM | POA: Diagnosis not present

## 2018-11-23 DIAGNOSIS — N898 Other specified noninflammatory disorders of vagina: Secondary | ICD-10-CM | POA: Insufficient documentation

## 2018-11-23 DIAGNOSIS — Z3009 Encounter for other general counseling and advice on contraception: Secondary | ICD-10-CM

## 2018-11-23 LAB — CERVICOVAGINAL ANCILLARY ONLY
Bacterial vaginitis: NEGATIVE
CHLAMYDIA, DNA PROBE: NEGATIVE
Candida vaginitis: NEGATIVE
NEISSERIA GONORRHEA: NEGATIVE
Trichomonas: NEGATIVE

## 2018-11-23 LAB — POCT PREGNANCY, URINE: Preg Test, Ur: NEGATIVE

## 2018-11-23 MED ORDER — NORETHIN-ETH ESTRAD-FE BIPHAS 1 MG-10 MCG / 10 MCG PO TABS: 1.0000 | ORAL_TABLET | Freq: Every day | ORAL | 11 refills | Status: DC

## 2018-11-23 NOTE — Progress Notes (Signed)
Patient ID: Jessica DareFadoua Wahbi [name], female   DOB: 04-30-1991, 27 y.o.   MRN: [id number]   History:  Ms. Jessica Horton is a 27 y.o. G1P1001 who presents to clinic today for chafing on her perineum that started  before her period on November 10th.  She changed her type of underwear; she tried monistat cream and that didn't help. She changed clothes and detergent. She then had a normal period on 11-10. Then, after her period she started having itching, burning bleeding on her vulva. She also developed discharge on 11-15. She describes it as yellow and white; it is not clumpy. She describes it as a mix of fishy and salty odor. It bothers her greatly. She says that she is "OCD" about washing and cleaning and she does not like this odor or discharge. She noticed this before she had sex with her husband on 11-15; it has continued. She tried monistat one which didn't help. She went to urgent care on 11-23 and received topical treatments for her skin itching and burning. She has not been using them regularly.  She tested negative for yeast, BV, trich, GC and was negative.   She also feels like her BCP makes her hungry and she feels nauseated at times.   The following portions of the patient's history were reviewed and updated as appropriate: allergies, current medications, family history, past medical history, social history, past surgical history and problem list.   BP 126/76 mmHg  Pulse 97  Temp(Src) 98.5 F (36.9 C)  Wt 112 lb (50.803 kg)  LMP 02/08/2015 CONSTITUTIONAL: Well-developed, well-nourished female in no acute distress.  EYES: EOM intact, conjunctivae normal, no scleral icterus HEAD: Normocephalic, atraumatic ENT: External right and left ear normal, oropharynx is clear and moist. CARDIOVASCULAR: Normal heart rate noted, regular rhythm. No cyanosis or edema. 2+ distal pulses.  RESPIRATORY: Clear to auscultation bilaterally. Effort and breath sounds normal, no problems with respiration  noted. GASTROINTESTINAL:Soft, normal bowel sounds, no distention noted.  No tenderness, rebound or guarding.  GENITOURINARY: External genitalia appears excoriated and reddened, no bleeding or signs of infection. Normal appearing vaginal mucosa and cervix.  Normal appearing discharge.   Normal uterine size, no other palpable masses, no uterine or adnexal tenderness.  MUSCULOSKELETAL: Normal range of motion. No tenderness. SKIN: Skin is warm and dry. No rash noted. Not diaphoretic. No erythema. No pallor. NEUROLGIC: Alert and oriented to person, place, and time. Normal reflexes, muscle tone, coordination. No cranial nerve deficit noted. PSYCHIATRIC: Normal mood and affect. Normal behavior. Normal judgment and thought content. HEM/LYMPH/IMMUNOLOGIC: Neck supple, no masses.  Normal thyroid.       Assessment & Plan:    1. Vaginal discharge    2. Patient to continue topic treatments prescribed in urgent care expect to use them more frequently as patient was using once a day instead of twice a day.   3. Repeated swabs today for patient's peace of mind, although I advised her that her discharge looked harmless and there was no odor.   4. Recommended that she stop frequent washing of her vulva and also that we are not going to treat her for anything if her swabs come back negative as it does not appear to me that she has yeast or BV. I provided reassurance about normal changes in vaginal pH and that it could be due to changes in birth control.   5. Gave new Rx for OCP to try; return in one month for follow up.  Jessica Horton,  Jessica Horton, CNM 11/23/2018 7:40 PM

## 2018-11-23 NOTE — Progress Notes (Signed)
Have had chaffing on my underwear line in groin area for 3wks. Tried new soaps and underwear and no change. Was seen urgent care Friday and given ointment and nystatin powder and still not change. Having yellow d/c with odor. Have pain in upper abd and nausea but also staying hungry a lot. Just do not like side affects of pill. Have not taken BCPs for a month. Had intercourse 11/13/18.

## 2018-11-25 LAB — CERVICOVAGINAL ANCILLARY ONLY
Bacterial vaginitis: NEGATIVE
CANDIDA VAGINITIS: NEGATIVE
CHLAMYDIA, DNA PROBE: NEGATIVE
NEISSERIA GONORRHEA: NEGATIVE
TRICH (WINDOWPATH): NEGATIVE

## 2018-12-21 ENCOUNTER — Ambulatory Visit: Payer: Medicaid Other | Admitting: Family Medicine

## 2018-12-29 ENCOUNTER — Telehealth: Payer: Self-pay

## 2018-12-29 NOTE — Telephone Encounter (Signed)
Called pt regarding message of cramping,advised close today @ 12p.Take Tylenol or Ibuprofen as needed for pain, if severe & has d/c to go to Urgent Care & that she can also come in Thursdays for Walk In Clinic. No answer, left VM.

## 2019-01-04 ENCOUNTER — Encounter: Payer: Self-pay | Admitting: Obstetrics and Gynecology

## 2019-01-04 ENCOUNTER — Ambulatory Visit (INDEPENDENT_AMBULATORY_CARE_PROVIDER_SITE_OTHER): Payer: Medicaid Other | Admitting: Obstetrics and Gynecology

## 2019-01-04 VITALS — BP 109/72 | HR 73 | Wt 169.0 lb

## 2019-01-04 DIAGNOSIS — Z3041 Encounter for surveillance of contraceptive pills: Secondary | ICD-10-CM | POA: Diagnosis present

## 2019-01-04 DIAGNOSIS — Z3202 Encounter for pregnancy test, result negative: Secondary | ICD-10-CM | POA: Insufficient documentation

## 2019-01-04 LAB — POCT PREGNANCY, URINE: Preg Test, Ur: NEGATIVE

## 2019-01-04 NOTE — Progress Notes (Signed)
Started BCPs in November and made her have n/v, headaches and just not feel well. Stopped pills first of December. Had some spotting last few day. Have taken upts and all negative

## 2019-01-04 NOTE — Progress Notes (Signed)
  Subjective:     Patient ID: Jessica Horton, female   DOB: January 13, 1991, 28 y.o.   MRN: [id number]  HPI   Ms.Jessica Horton is a 28 y.o. female here with concerns about pregnancy and wanting to discuss BC pills. Her menstrual cycle started on Dec 6 and it was normal. Her period was due to start on January 3rd; she is now late. Last night she had 1-2 episodes of bleeding and now it is light and pink. This bleeding is very different than the bleeding of her normal periods. She has Nausea and feels pregnant. Has taken 10 pregnancy tests and they are all negative. At this point would like to continue without BC pills to continue trying to conceive.   Review of Systems  Gastrointestinal: Positive for abdominal pain (Cramping since period ended) and nausea. Negative for vomiting.   Objective:   Physical Exam Constitutional:      Appearance: Normal appearance.  HENT:     Head: Normocephalic.  Pulmonary:     Effort: Pulmonary effort is normal.  Abdominal:     General: Abdomen is flat. Bowel sounds are normal.     Palpations: Abdomen is soft.  Musculoskeletal: Normal range of motion.  Neurological:     Mental Status: She is alert.    Assessment:   1. Pregnancy test negative 2. Visit for birth control pills maintenance  Plan:   - Pregnancy test negative today - patient decided she does not want a different form of BC at this time.  - Return if symptoms worsen - Discussed normalcy of abnormal bleeding after stopping BC pills.   Duane Lope, NP 01/04/2019 5:25 PM

## 2019-01-25 ENCOUNTER — Ambulatory Visit: Payer: Medicaid Other | Admitting: Obstetrics and Gynecology

## 2019-09-22 ENCOUNTER — Other Ambulatory Visit: Payer: Self-pay

## 2019-09-22 ENCOUNTER — Ambulatory Visit (INDEPENDENT_AMBULATORY_CARE_PROVIDER_SITE_OTHER): Payer: Medicaid Other

## 2019-09-22 DIAGNOSIS — Z3202 Encounter for pregnancy test, result negative: Secondary | ICD-10-CM | POA: Diagnosis not present

## 2019-09-22 MED ORDER — CLOTRIMAZOLE-BETAMETHASONE 1-0.05 % EX CREA: 1.0000 "application " | TOPICAL_CREAM | Freq: Two times a day (BID) | CUTANEOUS | 0 refills | Status: DC

## 2019-09-22 NOTE — Progress Notes (Signed)
Pt presents to office today for UPT; test resulted negative. Pt reported her LMP was late and lighter than her normal period; period lasted 9/5 to 9/10. She is now reporting pain in the LLQ of the abdomen and L lower back that started around ovulation. Pt denies likelihood of STIs. She rated the pain 5/10. Pt educated that pain is possibly a persistent ovarian cyst and should improve. Educated that she may take 600mg  ibuprofen for pain relief and to call the office if she is still having pain after her next period to schedule a follow up appt. Pt also asked questions regarding conceiving as she has been off of birth control for a year. Advised pt she keep track of ovulation window and have intercourse every other day at that time. Educated pt that she may make an appointment to discuss further with a provider. Pt verbalized understanding and does not have any further questions at this time.   Pt requested a refill for Lotrisone which she uses to prevent chafing. Verbal order obtained from [place], [place] and prescription sent to pharmacy  [place], RN 09/22/19

## 2019-09-23 NOTE — Progress Notes (Signed)
Patient seen and assessed by nursing staff during this encounter. I have reviewed the chart and agree with the documentation and plan.  Kerry Hough, PA-C 09/23/2019 10:39 AM

## 2020-02-13 ENCOUNTER — Other Ambulatory Visit: Payer: Self-pay

## 2020-02-13 ENCOUNTER — Ambulatory Visit (INDEPENDENT_AMBULATORY_CARE_PROVIDER_SITE_OTHER): Payer: Medicaid Other

## 2020-02-13 ENCOUNTER — Encounter (HOSPITAL_COMMUNITY): Payer: Self-pay | Admitting: Emergency Medicine

## 2020-02-13 ENCOUNTER — Ambulatory Visit (HOSPITAL_COMMUNITY)
Admission: EM | Admit: 2020-02-13 | Discharge: 2020-02-13 | Disposition: A | Discharge status: Home / Self Care | Payer: Medicaid Other | Attending: Urgent Care | Admitting: Urgent Care

## 2020-02-13 DIAGNOSIS — S93402A Sprain of unspecified ligament of left ankle, initial encounter: Secondary | ICD-10-CM

## 2020-02-13 DIAGNOSIS — M25572 Pain in left ankle and joints of left foot: Secondary | ICD-10-CM

## 2020-02-13 DIAGNOSIS — Z3202 Encounter for pregnancy test, result negative: Secondary | ICD-10-CM

## 2020-02-13 LAB — POC URINE PREG, ED: Preg Test, Ur: NEGATIVE

## 2020-02-13 LAB — POCT PREGNANCY, URINE: Preg Test, Ur: NEGATIVE

## 2020-02-13 MED ORDER — IBUPROFEN 800 MG PO TABS
ORAL_TABLET | ORAL | Status: AC
  Filled 2020-02-13: qty 1

## 2020-02-13 MED ORDER — IBUPROFEN 800 MG PO TABS
800.0000 mg | ORAL_TABLET | Freq: Once | ORAL | Status: AC
  Administered 2020-02-13: 16:00:00 800 mg via ORAL

## 2020-02-13 MED ORDER — NAPROXEN 500 MG PO TABS: 500.0000 mg | ORAL_TABLET | Freq: Two times a day (BID) | ORAL | 0 refills | Status: DC

## 2020-02-13 NOTE — ED Provider Notes (Signed)
[facility]   MRN: [id number] DOB: [birth year]  Subjective:   Jessica Horton is a 29 y.o. female presenting for 1 day history of accidental fall.  With this fall, patient injured her left ankle, rolled it outwardly and has since had persistent ankle pain and swelling.  She took medication yesterday but not today.  Has tried to rest and elevate her foot.  No current facility-administered medications for this encounter.  Current Outpatient Medications:  .  albuterol (PROVENTIL HFA;VENTOLIN HFA) 108 (90 Base) MCG/ACT inhaler, Inhale 2 puffs into the lungs every 6 (six) hours as needed (asthma)., Disp: 1 Inhaler, Rfl: 1 .  ibuprofen (ADVIL,MOTRIN) 800 MG tablet, Take 1 tablet (800 mg total) by mouth 3 (three) times daily., Disp: 21 tablet, Rfl: 0 .  atenolol (TENORMIN) 25 MG tablet, Take 1 tablet (25 mg total) by mouth daily. (Patient not taking: Reported on 03/02/2018), Disp: 30 tablet, Rfl: 2 .  benzonatate (TESSALON) 100 MG capsule, Take 1-2 capsules (100-200 mg total) by mouth 3 (three) times daily as needed for cough. (Patient not taking: Reported on 01/04/2019), Disp: 40 capsule, Rfl: 0 .  clotrimazole-betamethasone (LOTRISONE) cream, Apply 1 application topically 2 (two) times daily., Disp: 30 g, Rfl: 0 .  fluticasone (FLOVENT HFA) 110 MCG/ACT inhaler, Inhale 2 puffs into the lungs 2 (two) times daily., Disp: 1 Inhaler, Rfl: 1 .  Norethindrone-Ethinyl Estradiol-Fe Biphas (LO LOESTRIN FE) 1 MG-10 MCG / 10 MCG tablet, Take 1 tablet by mouth daily. (Patient not taking: Reported on 01/04/2019), Disp: 1 Package, Rfl: 11 .  nystatin (MYCOSTATIN/NYSTOP) powder, Apply topically 4 (four) times daily., Disp: 15 g, Rfl: 0   No Known Allergies  Past Medical History:  Diagnosis Date  . Asthma   . Asthma   . Headache(784.0)   . Migraine      Past Surgical History:  Procedure Laterality Date  . CESAREAN SECTION N/A 01/24/2014   Procedure: CESAREAN SECTION;  Surgeon: Esmeralda Arthur,  MD;  Location: [facility];  Service: Obstetrics;  Laterality: N/A;  . WISDOM TOOTH EXTRACTION      Family History  Problem Relation Age of Onset  . Diabetes Mother     Social History   Tobacco Use  . Smoking status: Never Smoker  . Smokeless tobacco: Never Used  Substance Use Topics  . Alcohol use: No  . Drug use: No    ROS   Objective:   Vitals: BP 109/72   Pulse 80   Temp 98.9 F (37.2 C) (Oral)   Resp 16   LMP 01/17/2020   SpO2 99%   Physical Exam Constitutional:      General: She is not in acute distress.    Appearance: Normal appearance. She is well-developed. She is not ill-appearing, toxic-appearing or diaphoretic.  HENT:     Head: Normocephalic and atraumatic.     Nose: Nose normal.     Mouth/Throat:     Mouth: Mucous membranes are moist.     Pharynx: Oropharynx is clear.  Eyes:     General: No scleral icterus.    Extraocular Movements: Extraocular movements intact.     Pupils: Pupils are equal, round, and reactive to light.  Cardiovascular:     Rate and Rhythm: Normal rate.  Pulmonary:     Effort: Pulmonary effort is normal.  Musculoskeletal:     Left ankle: Swelling (Trace over lateral and medial malleolus) present. No deformity, ecchymosis or lacerations. Tenderness present over the lateral malleolus, medial malleolus, ATF  ligament and AITF ligament. No base of 5th metatarsal tenderness. Decreased range of motion.     Left Achilles Tendon: Normal. No tenderness or defects. Thompson's test negative.  Skin:    General: Skin is warm and dry.  Neurological:     General: No focal deficit present.     Mental Status: She is alert and oriented to person, place, and time.  Psychiatric:        Mood and Affect: Mood normal.        Behavior: Behavior normal.     Results for orders placed or performed during the hospital encounter of 02/13/20 (from the past 24 hour(s))  POC urine pregnancy     Status: None   Collection Time: 02/13/20  4:11 PM  Result  Value Ref Range   Preg Test, Ur NEGATIVE NEGATIVE  Pregnancy, urine POC     Status: None   Collection Time: 02/13/20  4:11 PM  Result Value Ref Range   Preg Test, Ur NEGATIVE NEGATIVE   DG Ankle Complete Left  Result Date: 02/13/2020 CLINICAL DATA:  Ankle pain and swelling EXAM: LEFT ANKLE COMPLETE - 3+ VIEW COMPARISON:  None. FINDINGS: No fracture or malalignment. Ankle mortise is symmetric. There is mild soft tissue swelling. IMPRESSION: No acute osseous abnormality Electronically Signed   By: Donavan Foil M.D.   On: 02/13/2020 16:20   Left ankle wrapped using 3 inch Ace wrap and figure-of-eight method.  Assessment and Plan :   1. Sprain of left ankle, unspecified ligament, initial encounter   2. Acute left ankle pain     We will use conservative management including rice method, NSAID therapy for ankle sprain. Counseled patient on potential for adverse effects with medications prescribed/recommended today, ER and return-to-clinic precautions discussed, patient verbalized understanding.    Jaynee Eagles, [place] 02/13/20 1832

## 2020-02-13 NOTE — ED Triage Notes (Signed)
PT fell while walking into her garage yesterday. She can slightly bear weight but it is very painful.

## 2020-05-04 ENCOUNTER — Ambulatory Visit: Payer: Medicaid Other | Admitting: Obstetrics and Gynecology

## 2020-05-04 ENCOUNTER — Encounter: Payer: Self-pay | Admitting: Obstetrics and Gynecology

## 2020-07-06 ENCOUNTER — Ambulatory Visit: Payer: Medicaid Other | Admitting: Obstetrics and Gynecology

## 2020-07-21 ENCOUNTER — Ambulatory Visit: Payer: Medicaid Other

## 2020-12-13 ENCOUNTER — Ambulatory Visit (INDEPENDENT_AMBULATORY_CARE_PROVIDER_SITE_OTHER): Payer: Medicaid Other | Admitting: Student

## 2020-12-13 ENCOUNTER — Other Ambulatory Visit (HOSPITAL_COMMUNITY)
Admission: RE | Admit: 2020-12-13 | Discharge: 2020-12-13 | Disposition: A | Discharge status: Home / Self Care | Payer: Medicaid Other | Source: Ambulatory Visit | Attending: Student | Admitting: Student

## 2020-12-13 ENCOUNTER — Ambulatory Visit: Payer: Medicaid Other | Admitting: Student

## 2020-12-13 ENCOUNTER — Encounter: Payer: Self-pay | Admitting: Student

## 2020-12-13 ENCOUNTER — Other Ambulatory Visit: Payer: Self-pay

## 2020-12-13 VITALS — BP 122/81 | HR 84 | Wt 174.4 lb

## 2020-12-13 DIAGNOSIS — Z01419 Encounter for gynecological examination (general) (routine) without abnormal findings: Secondary | ICD-10-CM

## 2020-12-13 NOTE — Progress Notes (Signed)
   History:  Ms. Jessica Horton is a 29 y.o. G1P1001 who presents to clinic today for concerned for the color of her periods and also that she has cramping "the whole month". She has been having "cramping" for 6 months. Periods vary in color from dark brown to burgundy.   The following portions of the patient's history were reviewed and updated as appropriate: allergies, current medications, family history, past medical history, social history, past surgical history and problem list.  Review of Systems:  Review of Systems  Constitutional: Negative.   HENT: Negative.   Respiratory: Negative.   Cardiovascular: Negative.   Skin: Negative.   Neurological: Negative.   Psychiatric/Behavioral: Negative.       Objective:  Physical Exam BP 122/81   Pulse 84   Wt 174 lb 6.4 oz (79.1 kg)   LMP 11/23/2020 (Exact Date)   BMI 32.95 kg/m  Physical Exam Constitutional:      Appearance: Normal appearance.  HENT:     Head: Normocephalic.  Cardiovascular:     Pulses: Normal pulses.  Pulmonary:     Effort: Pulmonary effort is normal.  Genitourinary:    General: Normal vulva.     Vagina: No vaginal discharge.  Musculoskeletal:        General: Normal range of motion.  Skin:    General: Skin is warm and dry.  Neurological:     Mental Status: She is alert.       Labs and Imaging No results found for this or any previous visit (from the past 24 hour(s)).  No results found.   Assessment & Plan:  1. Well woman exam with routine gynecological exam - Cytology - PAP( [place]) -No STI testing -return to clinic to talk to MD about fertility -HgA1c for school is checked weekly (she is [employer] student) and is normal -will do TSH -Reassured that menses sounds normal, recommended strategically timed intercourse for optimal conception  Approximately 20 minutes of total time was spent with this patient on counseling and care.   Marylene Land, CNM 12/13/2020 1:25  PM

## 2020-12-14 ENCOUNTER — Other Ambulatory Visit: Payer: Self-pay | Admitting: Medical

## 2020-12-14 LAB — TSH: TSH: 1.34 u[IU]/mL (ref 0.450–4.500)

## 2020-12-15 ENCOUNTER — Telehealth: Payer: Self-pay | Admitting: Obstetrics & Gynecology

## 2020-12-15 LAB — CYTOLOGY - PAP: Diagnosis: NEGATIVE

## 2020-12-15 NOTE — Telephone Encounter (Signed)
Pt calling back (called yesterday as well) states that the pharmacy still does not have prescription for medication. Pt request a call back about medication. Thanks

## 2020-12-15 NOTE — Telephone Encounter (Addendum)
Message has been received from [facility] pharmacy that pt is requesting refill of Clotrimazole-Betamethasone cream (last filled on 09/22/19). Pt was recently seen in office on 12/13/20 by K. Crisoforo Oxford. A message has been sent to her requesting refill order.   12/21 1433  Called pt and left message that I am calling to discuss her refill request. I stated that I will send her a Mychart message if she could please return the message with requested information.   12/22  1635  Per chart review, pt has seen the Mychart message sent yesterday however has not responded to the requested information.

## 2020-12-19 ENCOUNTER — Encounter: Payer: Self-pay | Admitting: *Deleted

## 2020-12-19 ENCOUNTER — Encounter (HOSPITAL_COMMUNITY): Payer: Self-pay

## 2020-12-19 ENCOUNTER — Ambulatory Visit (HOSPITAL_COMMUNITY)
Admission: EM | Admit: 2020-12-19 | Discharge: 2020-12-19 | Disposition: A | Discharge status: Home / Self Care | Payer: Medicaid Other | Attending: Student | Admitting: Student

## 2020-12-19 DIAGNOSIS — S61211A Laceration without foreign body of left index finger without damage to nail, initial encounter: Secondary | ICD-10-CM | POA: Diagnosis not present

## 2020-12-19 MED ORDER — LIDOCAINE HCL 2 % IJ SOLN
INTRAMUSCULAR | Status: AC
  Filled 2020-12-19: qty 20

## 2020-12-19 MED ORDER — NAPROXEN 500 MG PO TABS: 500.0000 mg | ORAL_TABLET | Freq: Two times a day (BID) | ORAL | 0 refills | Status: DC

## 2020-12-19 NOTE — ED Triage Notes (Signed)
Pt presents with laceration in the left index finger. States she was trying to cut aluminum paper from a roll and cut the finger with the metal part of the box 1 hr ago appox.   Pt states her Tdap is up to date.

## 2020-12-19 NOTE — ED Provider Notes (Addendum)
[facility]    CSN: [id number] Arrival date & time: 12/19/20  1650      History   Chief Complaint Chief Complaint  Patient presents with  . Laceration    HPI Jessica Horton is a 29 y.o. female presenting for laceration. History of asthma, headaches, migraines. Pt presents with laceration in the left index finger. States she was trying to cut aluminum paper from a roll and cut the finger with the metal part of the box 2 hr ago appox. She applied pressure and came straight here. Denies injuries elsewehre.  HPI  Past Medical History:  Diagnosis Date  . Asthma   . Asthma   . Headache(784.0)   . Migraine     Patient Active Problem List   Diagnosis Date Noted  . Pregnancy test negative 01/04/2019  . Visit for birth control pills maintenance 01/04/2019  . Vaginal discharge 11/23/2018  . Cesarean delivery delivered 01/24/2014  . Asthma, chronic 01/22/2014  . Migraines 01/22/2014    Past Surgical History:  Procedure Laterality Date  . CESAREAN SECTION N/A 01/24/2014   Procedure: CESAREAN SECTION;  Surgeon: Esmeralda Arthur, MD;  Location: [facility];  Service: Obstetrics;  Laterality: N/A;  . WISDOM TOOTH EXTRACTION      OB History    Gravida  1   Para  1   Term  1   Preterm  0   AB  0   Living  1     SAB  0   IAB  0   Ectopic  0   Multiple  0   Live Births  1            Home Medications    Prior to Admission medications   Medication Sig Start Date End Date Taking? Authorizing Provider  albuterol (PROVENTIL HFA;VENTOLIN HFA) 108 (90 Base) MCG/ACT inhaler Inhale 2 puffs into the lungs every 6 (six) hours as needed (asthma). 11/21/18   Bing Neighbors, FNP  clotrimazole-betamethasone (LOTRISONE) cream Apply 1 application topically 2 (two) times daily. 09/22/19   Marny Lowenstein, PA-C  fluticasone (FLOVENT HFA) 110 MCG/ACT inhaler Inhale 2 puffs into the lungs 2 (two) times daily. 03/04/16   Marlon Pel, PA-C  ibuprofen  (ADVIL,MOTRIN) 800 MG tablet Take 1 tablet (800 mg total) by mouth 3 (three) times daily. 07/04/18   Garlon Hatchet, PA-C  naproxen (NAPROSYN) 500 MG tablet Take 1 tablet (500 mg total) by mouth 2 (two) times daily. 12/19/20   Rhys Martini, PA-C  nystatin (MYCOSTATIN/NYSTOP) powder Apply topically 4 (four) times daily. 11/21/18   Bing Neighbors, FNP    Family History Family History  Problem Relation Age of Onset  . Diabetes Mother     Social History Social History   Tobacco Use  . Smoking status: Never Smoker  . Smokeless tobacco: Never Used  Vaping Use  . Vaping Use: Never used  Substance Use Topics  . Alcohol use: No  . Drug use: No     Allergies   Patient has no known allergies.   Review of Systems Review of Systems  Skin: Positive for wound.  All other systems reviewed and are negative.    Physical Exam Triage Vital Signs ED Triage Vitals  Enc Vitals Group     BP 12/19/20 1742 123/72     Pulse Rate 12/19/20 1742 72     Resp 12/19/20 1742 18     Temp 12/19/20 1742 98.6 F (37 C)  Temp Source 12/19/20 1742 Oral     SpO2 12/19/20 1742 96 %     Weight --      Height --      Head Circumference --      Peak Flow --      Pain Score 12/19/20 1745 10     Pain Loc --      Pain Edu? --      Excl. in GC? --    No data found.  Updated Vital Signs BP 123/72 (BP Location: Right Arm)   Pulse 72   Temp 98.6 F (37 C) (Oral)   Resp 18   LMP 11/23/2020 (Exact Date)   SpO2 96%   Visual Acuity Right Eye Distance:   Left Eye Distance:   Bilateral Distance:    Right Eye Near:   Left Eye Near:    Bilateral Near:     Physical Exam Vitals reviewed.  Constitutional:      Appearance: Normal appearance.  HENT:     Head: Normocephalic and atraumatic.  Cardiovascular:     Rate and Rhythm: Normal rate and regular rhythm.     Heart sounds: Normal heart sounds.  Pulmonary:     Effort: Pulmonary effort is normal.     Breath sounds: Normal breath  sounds.  Musculoskeletal:     Comments: Left index finger ROM intact  Skin:    General: Skin is warm.     Comments: 2cm straight laceration to volar aspect of left index finger, overlying PIP. 26mm of superficial fascia visible centrally. Neurovascularly intact. No other lacerations or wounds.   Neurological:     General: No focal deficit present.     Mental Status: She is alert and oriented to person, place, and time.     Comments: Left index finger is neurovascularly intact  Psychiatric:        Mood and Affect: Mood normal.        Behavior: Behavior normal.        Thought Content: Thought content normal.        Judgment: Judgment normal.      UC Treatments / Results  Labs (all labs ordered are listed, but only abnormal results are displayed) Labs Reviewed - No data to display  EKG   Radiology No results found.  Procedures Laceration Repair  Date/Time: 12/19/2020 7:22 PM Performed by: Rhys Martini, PA-C Authorized by: Rhys Martini, PA-C   Consent:    Consent obtained:  Verbal   Consent given by:  Patient   Risks, benefits, and alternatives were discussed: yes     Risks discussed:  Pain, vascular damage and infection Universal protocol:    Procedure explained and questions answered to patient or proxy's satisfaction: yes     Patient identity confirmed:  Verbally with patient Anesthesia:    Anesthesia method:  Local infiltration   Local anesthetic:  Lidocaine 2% w/o epi Laceration details:    Location:  Finger   Finger location:  L index finger   Length (cm):  2   Depth (mm):  1 Exploration:    Wound extent: fascia violated     Wound extent: no foreign bodies/material noted, no muscle damage noted, no nerve damage noted, no tendon damage noted and no underlying fracture noted     Contaminated: no   Treatment:    Area cleansed with:  Povidone-iodine   Amount of cleaning:  Standard   Irrigation solution:  Sterile saline   Visualized foreign  bodies/material  removed: no     Debridement:  None   Layers/structures repaired:  Deep dermal/superficial fascia Skin repair:    Repair method:  Sutures   Suture size:  5-0   Suture technique:  Simple interrupted   Number of sutures:  6 Approximation:    Approximation:  Close Repair type:    Repair type:  Simple Post-procedure details:    Dressing:  Non-adherent dressing   Procedure completion:  Tolerated well, no immediate complications   (including critical care time)  Medications Ordered in UC Medications - No data to display  Initial Impression / Assessment and Plan / UC Course  I have reviewed the triage vital signs and the nursing notes.  Pertinent labs & imaging results that were available during my care of the patient were reviewed by me and considered in my medical decision making (see chart for details).     6 sutures as above. Pt tolerated procedure well. Return in 7 days for suture removal. Wound care discussed and instructions provided. Return precautions- chest pain, shortness of breath, new/worsening fevers/chills, confusion, worsening of symptoms despite the above treatment plan, etc. Tdap is UTD.   Final Clinical Impressions(s) / UC Diagnoses   Final diagnoses:  Laceration of left index finger without foreign body without damage to nail, initial encounter     Discharge Instructions     Return for suture removal in 7 days.   You can use naproxen alternated with tylenol for pain. You can also use ice packs.     ED Prescriptions    Medication Sig Dispense Auth. Provider   naproxen (NAPROSYN) 500 MG tablet Take 1 tablet (500 mg total) by mouth 2 (two) times daily. 30 tablet Rhys Martini, PA-C     PDMP not reviewed this encounter.   Rhys Martini, PA-C 12/19/20 2051    Rhys Martini, PA-C 12/21/20 [phone]

## 2020-12-19 NOTE — Discharge Instructions (Addendum)
Return for suture removal in 7 days.   You can use naproxen alternated with tylenol for pain. You can also use ice packs.

## 2020-12-22 ENCOUNTER — Other Ambulatory Visit: Payer: Self-pay | Admitting: Student

## 2021-01-17 ENCOUNTER — Ambulatory Visit: Payer: Medicaid Other | Admitting: Obstetrics & Gynecology

## 2021-02-16 ENCOUNTER — Ambulatory Visit: Payer: Medicaid Other | Admitting: Obstetrics & Gynecology

## 2021-03-14 ENCOUNTER — Encounter: Payer: Self-pay | Admitting: Obstetrics & Gynecology

## 2021-03-14 ENCOUNTER — Other Ambulatory Visit: Payer: Self-pay

## 2021-03-14 ENCOUNTER — Ambulatory Visit (INDEPENDENT_AMBULATORY_CARE_PROVIDER_SITE_OTHER): Payer: Medicaid Other | Admitting: Obstetrics & Gynecology

## 2021-03-14 ENCOUNTER — Telehealth: Payer: Self-pay

## 2021-03-14 VITALS — BP 101/64 | HR 76 | Ht 61.0 in | Wt 176.0 lb

## 2021-03-14 DIAGNOSIS — N979 Female infertility, unspecified: Secondary | ICD-10-CM | POA: Diagnosis not present

## 2021-03-14 NOTE — Progress Notes (Signed)
   GYNECOLOGY OFFICE VISIT NOTE  History:   Jessica Horton is a 30 y.o. G1P1001 here today for evaluation of secondary infertility. Had one pregnancy resulting in full term birth in 2015, delivered via cesarean section. Uncomplicated.  Has been trying to get pregnant for two years. Has used ovulation predictor kits which show she is ovulating, has had times intercourse, has regular menstrual periods. Interested in further evaluation and discussion of ovulation induction agents. She denies any abnormal vaginal discharge, bleeding, pelvic pain or other concerns.    Past Medical History:  Diagnosis Date  . Asthma   . Asthma   . Headache(784.0)   . Migraine     Past Surgical History:  Procedure Laterality Date  . CESAREAN SECTION N/A 01/24/2014   Procedure: CESAREAN SECTION;  Surgeon: Esmeralda Arthur, MD;  Location: [facility];  Service: Obstetrics;  Laterality: N/A;  . WISDOM TOOTH EXTRACTION      The following portions of the patient's history were reviewed and updated as appropriate: allergies, current medications, past family history, past medical history, past social history, past surgical history and problem list.   Health Maintenance:  Normal pap on 12/13/2020   Review of Systems:  Pertinent items noted in HPI and remainder of comprehensive ROS otherwise negative.  Physical Exam:  BP 101/64   Pulse 76   Ht 5\' 1"  (1.549 m)   Wt 176 lb (79.8 kg)   LMP 02/18/2021 (Exact Date)   BMI 33.25 kg/m  CONSTITUTIONAL: Well-developed, well-nourished female in no acute distress.  HEENT:  Normocephalic, atraumatic. External right and left ear normal. No scleral icterus.  NECK: Normal range of motion, supple, no masses noted on observation SKIN: No rash noted. Not diaphoretic. No erythema. No pallor. MUSCULOSKELETAL: Normal range of motion. No edema noted. NEUROLOGIC: Alert and oriented to person, place, and time. Normal muscle tone coordination. No cranial nerve deficit  noted. PSYCHIATRIC: Normal mood and affect. Normal behavior. Normal judgment and thought content. CARDIOVASCULAR: Normal heart rate noted RESPIRATORY: Effort and breath sounds normal, no problems with respiration noted ABDOMEN: No masses noted. No other overt distention noted.   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 Stressed importance of semen analysis also in this process.  Will start off with HSG, and also ordered tests of ovulatory function/reserve. Will return on Day of cycle for the [facility] and estradiol, AMH will be checked too.  Pending results, will discuss trial of Letrozole, patient given information about this. - Anti mullerian hormone; Future - Follicle stimulating hormone; Future - Estradiol; Future - DG Hysterogram (HSG); Future Routine preventative health maintenance measures emphasized. Please refer to After Visit Summary for other counseling recommendations.   Return in about 6 days (around 03/20/2021) for Lab appointment (Day 3 of cycle FSH, estradiol and AMH).    I spent 20 minutes dedicated to the care of this patient including pre-visit review of records, face to face time with the patient discussing her conditions and treatments and post visit ordering of testing.    03/22/2021, MD, FACOG Obstetrician & Gynecologist, [facility] for [facility]

## 2021-03-14 NOTE — Patient Instructions (Signed)
Discussed Letrozole which has been shown to be superior to Clomid in ovulation induction.  Letrozole is  prescribed, 2.5 mg po daily on days 3 to 7 following a spontaneous menses or progestin-induced bleed. If the cycle is ovulatory but pregnancy has not occurred, the same dose should be used in the next cycle. If ovulation does not occur, the dose should be increased to 5 mg/day cycle days 3 to 7, with a maximal dose of 7.5 mg/day.    Continue ovulation predictor kits and regular intercourse   Another name for Letrozole is Femara

## 2021-03-14 NOTE — Telephone Encounter (Signed)
Tried to call Pt to advise that she will need to call [facility] the 1st day that she starts her cycle to get the HSG scheduled, she will need to call [phone] opt-1 then opt-5. No answer, mail box not set up.Will try later.

## 2021-03-15 ENCOUNTER — Telehealth: Payer: Self-pay

## 2021-03-15 NOTE — Telephone Encounter (Signed)
Called Pt back to inform her that she would need to call them to get her HSG scheduled on the 1st day of her Cycle per [facility] Pt number to call. Pt verbalized understanding.

## 2021-03-16 ENCOUNTER — Telehealth: Payer: Self-pay | Admitting: Family Medicine

## 2021-03-16 NOTE — Telephone Encounter (Signed)
Want to ask questions about a referral that she suppose to have

## 2021-03-16 NOTE — Telephone Encounter (Signed)
Returned patients call. She did not answer. Voicemail box not set up so unable to leave a message. Will send My Chart message.

## 2021-03-19 ENCOUNTER — Other Ambulatory Visit: Payer: Self-pay

## 2021-03-19 ENCOUNTER — Other Ambulatory Visit: Payer: Medicaid Other

## 2021-03-19 DIAGNOSIS — N979 Female infertility, unspecified: Secondary | ICD-10-CM

## 2021-03-20 ENCOUNTER — Other Ambulatory Visit: Payer: Medicaid Other

## 2021-03-25 LAB — FOLLICLE STIMULATING HORMONE: FSH: 5.4 m[IU]/mL

## 2021-03-25 LAB — ANTI MULLERIAN HORMONE: ANTI-MULLERIAN HORMONE (AMH): 0.664 ng/mL

## 2021-03-25 LAB — ESTRADIOL: Estradiol: 36.7 pg/mL

## 2021-03-26 ENCOUNTER — Encounter (HOSPITAL_COMMUNITY): Payer: Self-pay

## 2021-03-26 ENCOUNTER — Ambulatory Visit (HOSPITAL_COMMUNITY)
Admission: EM | Admit: 2021-03-26 | Discharge: 2021-03-26 | Disposition: A | Discharge status: Home / Self Care | Payer: Medicaid Other | Attending: Emergency Medicine | Admitting: Emergency Medicine

## 2021-03-26 ENCOUNTER — Other Ambulatory Visit: Payer: Self-pay

## 2021-03-26 DIAGNOSIS — R200 Anesthesia of skin: Secondary | ICD-10-CM | POA: Diagnosis not present

## 2021-03-26 DIAGNOSIS — S6992XD Unspecified injury of left wrist, hand and finger(s), subsequent encounter: Secondary | ICD-10-CM | POA: Diagnosis not present

## 2021-03-26 MED ORDER — NAPROXEN 500 MG PO TABS: 500.0000 mg | ORAL_TABLET | Freq: Two times a day (BID) | ORAL | 0 refills | Status: DC

## 2021-03-26 NOTE — ED Provider Notes (Signed)
[facility]    CSN: [id number] Arrival date & time: 03/26/21  1357      History   Chief Complaint Chief Complaint  Patient presents with  . Numbness  . knot on finger    HPI Jessica Horton is a 30 y.o. female.   Jessica Horton El Jessica Horton is a 30 y.o. here with chief complaint of finger numbness and small mass since laceration in December.  Reports numbness on entire side of left index finger and a small mass at PIP.  Full range of motion.  Laceration healed with no signs or symptoms of infection. Denies any specific alleviating or aggravating factors.  Denies any fevers, chest pain, shortness of breath, N/V/D, numbness, tingling, weakness, abdominal pain, or headaches.   ROS: As per HPI, all other pertinent ROS negative    The history is provided by the patient.    Past Medical History:  Diagnosis Date  . Asthma   . Asthma   . Headache(784.0)   . Migraine     Patient Active Problem List   Diagnosis Date Noted  . Asthma, chronic 01/22/2014  . Migraines 01/22/2014    Past Surgical History:  Procedure Laterality Date  . CESAREAN SECTION N/A 01/24/2014   Procedure: CESAREAN SECTION;  Surgeon: Esmeralda Arthur, MD;  Location: [facility];  Service: Obstetrics;  Laterality: N/A;  . WISDOM TOOTH EXTRACTION      OB History    Gravida  1   Para  1   Term  1   Preterm  0   AB  0   Living  1     SAB  0   IAB  0   Ectopic  0   Multiple  0   Live Births  1            Home Medications    Prior to Admission medications   Medication Sig Start Date End Date Taking? Authorizing Provider  naproxen (NAPROSYN) 500 MG tablet Take 1 tablet (500 mg total) by mouth 2 (two) times daily. 03/26/21  Yes Ivette Loyal, NP  albuterol (PROVENTIL HFA;VENTOLIN HFA) 108 (90 Base) MCG/ACT inhaler Inhale 2 puffs into the lungs every 6 (six) hours as needed (asthma). 11/21/18   Bing Neighbors, FNP  clotrimazole-betamethasone (LOTRISONE) cream APPLY TO  AFFECTED AREA TWICE A DAY 12/21/20   Marny Lowenstein, PA-C  fluticasone (FLOVENT HFA) 110 MCG/ACT inhaler Inhale 2 puffs into the lungs 2 (two) times daily. 03/04/16   Marlon Pel, PA-C  ibuprofen (ADVIL,MOTRIN) 800 MG tablet Take 1 tablet (800 mg total) by mouth 3 (three) times daily. 07/04/18   Garlon Hatchet, PA-C  nystatin (MYCOSTATIN/NYSTOP) powder Apply topically 4 (four) times daily. 11/21/18   Bing Neighbors, FNP    Family History Family History  Problem Relation Age of Onset  . Diabetes Mother     Social History Social History   Tobacco Use  . Smoking status: Never Smoker  . Smokeless tobacco: Never Used  Vaping Use  . Vaping Use: Never used  Substance Use Topics  . Alcohol use: No  . Drug use: No     Allergies   Patient has no known allergies.   Review of Systems Review of Systems   Physical Exam Triage Vital Signs ED Triage Vitals  Enc Vitals Group     BP 03/26/21 1429 108/68     Pulse Rate 03/26/21 1429 76     Resp 03/26/21 1429 19  Temp 03/26/21 1429 98.5 F (36.9 C)     Temp src --      SpO2 03/26/21 1429 98 %     Weight --      Height --      Head Circumference --      Peak Flow --      Pain Score 03/26/21 1428 7     Pain Loc --      Pain Edu? --      Excl. in GC? --    No data found.  Updated Vital Signs BP 108/68   Pulse 76   Temp 98.5 F (36.9 C)   Resp 19   LMP 03/15/2021 (Exact Date)   SpO2 98%   Visual Acuity Right Eye Distance:   Left Eye Distance:   Bilateral Distance:    Right Eye Near:   Left Eye Near:    Bilateral Near:     Physical Exam Vitals and nursing note reviewed.  Constitutional:      General: She is not in acute distress.    Appearance: Normal appearance. She is not ill-appearing, toxic-appearing or diaphoretic.  HENT:     Head: Normocephalic and atraumatic.  Eyes:     Conjunctiva/sclera: Conjunctivae normal.  Cardiovascular:     Rate and Rhythm: Normal rate.     Pulses: Normal pulses.   Pulmonary:     Effort: Pulmonary effort is normal.  Abdominal:     General: Abdomen is flat.  Musculoskeletal:        General: Normal range of motion.     Left hand: No swelling or deformity. Normal range of motion. Decreased sensation (lateral side of left index). Normal capillary refill. Normal pulse.     Cervical back: Normal range of motion.  Skin:    General: Skin is warm and dry.  Neurological:     General: No focal deficit present.     Mental Status: She is alert and oriented to person, place, and time.  Psychiatric:        Mood and Affect: Mood normal.      UC Treatments / Results  Labs (all labs ordered are listed, but only abnormal results are displayed) Labs Reviewed - No data to display  EKG   Radiology No results found.  Procedures Procedures (including critical care time)  Medications Ordered in UC Medications - No data to display  Initial Impression / Assessment and Plan / UC Course  I have reviewed the triage vital signs and the nursing notes.  Pertinent labs & imaging results that were available during my care of the patient were reviewed by me and considered in my medical decision making (see chart for details).     Numbness Injury of left hand Assessment negative for red flags or concerns.  Take naproxen or ibuprofen as needed for pain. Follow-up with Dr. Izora Ribas with pain for ongoing finger numbness since laceration in December. Follow-up as needed  Final Clinical Impressions(s) / UC Diagnoses   Final diagnoses:  Numbness  Injury of finger of left hand, subsequent encounter     Discharge Instructions     Follow up with Dr. Izora Ribas for consultation for ongoing finger numbness from laceration in December.   You can take Naproxen as needed for pain.   You can take Ibuprofen as needed for pain.   Return or go to the Emergency Department if symptoms worsen or do not improve in the next few days.      ED Prescriptions  Medication  Sig Dispense Auth. Provider   naproxen (NAPROSYN) 500 MG tablet Take 1 tablet (500 mg total) by mouth 2 (two) times daily. 30 tablet Ivette Loyal, NP     PDMP not reviewed this encounter.   Ivette Loyal, NP 03/26/21 1535

## 2021-03-26 NOTE — ED Triage Notes (Signed)
Pt in with c/o left index finger pain and numbness that has been going on for a few months after she had stitches removed  Also states she noticed a small knot develop on her index finger

## 2021-03-26 NOTE — Discharge Instructions (Addendum)
Follow up with Dr. Izora Ribas for consultation for ongoing finger numbness from laceration in December.   You can take Naproxen as needed for pain.   You can take Ibuprofen as needed for pain.   Return or go to the Emergency Department if symptoms worsen or do not improve in the next few days.

## 2021-05-17 ENCOUNTER — Ambulatory Visit
Admission: RE | Admit: 2021-05-17 | Discharge: 2021-05-17 | Disposition: A | Discharge status: Home / Self Care | Payer: Medicaid Other | Source: Ambulatory Visit | Attending: Obstetrics & Gynecology | Admitting: Obstetrics & Gynecology

## 2021-05-17 DIAGNOSIS — N979 Female infertility, unspecified: Secondary | ICD-10-CM

## 2021-06-11 ENCOUNTER — Other Ambulatory Visit: Payer: Self-pay

## 2021-06-11 ENCOUNTER — Ambulatory Visit (INDEPENDENT_AMBULATORY_CARE_PROVIDER_SITE_OTHER): Payer: Medicaid Other | Admitting: Obstetrics & Gynecology

## 2021-06-11 ENCOUNTER — Encounter: Payer: Self-pay | Admitting: Obstetrics & Gynecology

## 2021-06-11 VITALS — BP 106/61 | HR 72 | Wt 176.7 lb

## 2021-06-11 DIAGNOSIS — R238 Other skin changes: Secondary | ICD-10-CM

## 2021-06-11 DIAGNOSIS — E349 Endocrine disorder, unspecified: Secondary | ICD-10-CM | POA: Diagnosis not present

## 2021-06-11 DIAGNOSIS — R7989 Other specified abnormal findings of blood chemistry: Secondary | ICD-10-CM

## 2021-06-11 DIAGNOSIS — N979 Female infertility, unspecified: Secondary | ICD-10-CM | POA: Diagnosis not present

## 2021-06-11 MED ORDER — NYSTATIN 100000 UNIT/GM EX POWD: Freq: Four times a day (QID) | CUTANEOUS | 2 refills | Status: DC | PRN

## 2021-06-11 MED ORDER — LETROZOLE 2.5 MG PO TABS: 2.5000 mg | ORAL_TABLET | Freq: Every day | ORAL | 1 refills | Status: DC

## 2021-06-11 MED ORDER — CLOTRIMAZOLE-BETAMETHASONE 1-0.05 % EX CREA: TOPICAL_CREAM | Freq: Two times a day (BID) | CUTANEOUS | 2 refills | Status: DC | PRN

## 2021-06-11 NOTE — Patient Instructions (Signed)
Prescribed Letrozole (Femara)  which has been shown to be superior to Clomid in ovulation induction.   Letrozole 2.5 mg po daily on days 3 to 7 following a spontaneous menses or progestin-induced bleed. If the cycle is ovulatory but pregnancy has not occurred, the same dose should be used in the next cycle. If ovulation does not occur, the dose should be increased to 5 mg/day cycle days 3 to 7 for two cycles.  Maximum dose of 7.5 mg/day for two cycles.  Total cycles for the medication would be six cycles. If no pregnancy, referral to [facility] specialist is recommended.  She will continue ovulation predictor kits and regular intercourse

## 2021-06-11 NOTE — Progress Notes (Signed)
GYNECOLOGY OFFICE VISIT NOTE  History:   Jessica Horton is a 30 y.o. 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 She also desired refills for skin irritation due to underwear, happens occasionally. Has tried several types and fabrics.  Seamless underwear helps now. She denies any abnormal vaginal discharge, bleeding, pelvic pain or other concerns.     Past Medical History:  Diagnosis Date   Asthma    Asthma    Headache(784.0)    Migraine     Past Surgical History:  Procedure Laterality Date   CESAREAN SECTION N/A 01/24/2014   Procedure: CESAREAN SECTION;  Surgeon: Esmeralda Arthur, MD;  Location: [facility];  Service: Obstetrics;  Laterality: N/A;   WISDOM TOOTH EXTRACTION      The following portions of the patient's history were reviewed and updated as appropriate: allergies, current medications, past family history, past medical history, past social history, past surgical history and problem list.   Health Maintenance:  Normal pap on 12/13/2020.   Review of Systems:  Pertinent items noted in HPI and remainder of comprehensive ROS otherwise negative.  Physical Exam:  BP 106/61   Pulse 72   Wt 176 lb 11.2 oz (80.2 kg)   LMP 06/04/2021 (Exact Date)   BMI 33.39 kg/m  CONSTITUTIONAL: Well-developed, well-nourished female in no acute distress.  HEENT:  Normocephalic, atraumatic. External right and left ear normal. No scleral icterus.  NECK: Normal range of motion, supple, no masses noted on observation SKIN: No rash noted. Not diaphoretic. No erythema. No pallor. MUSCULOSKELETAL: Normal range of motion. No edema noted. NEUROLOGIC: Alert and oriented to person, place, and time. Normal muscle tone coordination. No cranial nerve deficit noted. PSYCHIATRIC: Normal mood and affect. Normal behavior. Normal judgment and thought content. CARDIOVASCULAR: Normal heart rate noted RESPIRATORY: Effort and breath sounds normal, no problems with  respiration noted ABDOMEN: No masses noted. No other overt distention noted.   PELVIC: Deferred  Labs and Imaging Results for orders placed or performed in visit on 03/19/21 (from the past 2520 hour(s))  Estradiol   Collection Time: 03/19/21  9:55 AM  Result Value Ref Range   Estradiol 36.7 pg/mL  Follicle stimulating hormone   Collection Time: 03/19/21  9:55 AM  Result Value Ref Range   FSH 5.4 mIU/mL  Anti mullerian hormone   Collection Time: 03/19/21  9:55 AM  Result Value Ref Range   ANTI-MULLERIAN HORMONE (AMH) 0.664 ng/mL    DG Hysterogram (HSG)  Result Date: 05/17/2021 CLINICAL DATA:  Secondary infertility. EXAM: HYSTEROSALPINGOGRAM TECHNIQUE: Following cleansing of the cervix and vagina with Betadine solution, a hysterosalpingogram was performed using a 5-French hysterosalpingogram catheter and Omnipaque 300 contrast. The patient tolerated the examination without difficulty. COMPARISON:  None. FLUOROSCOPY TIME:  Radiation Exposure Index (as provided by the fluoroscopic device): 22.8 mGy If the device does not provide the exposure index: Fluoroscopy Time:  1 minutes 6 seconds Number of Acquired Images:  0 FINDINGS: Endometrial Cavity: Normal appearance. No signs of Mullerian duct anomaly or other significant abnormality. Right Fallopian Tube: Normal appearance. Free intraperitoneal spill of contrast is demonstrated. Left Fallopian Tube: Normal appearance. Free intraperitoneal spill of contrast is demonstrated. Other:  None. IMPRESSION: Normal study. Both Fallopian tubes are patent. Electronically Signed   By: Danae Orleans M.D.   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 Remarkable for low AMH. Discussed  ovulation induction agents, Femara recommended. Patient agreed. This was prescribed. Letrozole 2.5 mg po daily on days 3 to 7 following a spontaneous menses or progestin-induced bleed. If the  cycle is ovulatory but pregnancy has not occurred, the same dose should be used in the next cycle. If ovulation does not occur, the dose should be increased to 5 mg/day cycle days 3 to 7 for two cycles.  Maximum dose of 7.5 mg/day for two cycles.  Total cycles for the medication would be six cycles. If no pregnancy, referral to [facility] specialist is recommended.  She will continue ovulation predictor kits and regular intercourse  - letrozole (FEMARA) 2.5 MG tablet; Take 1 tablet (2.5 mg total) by mouth daily. Take on days 3 to 7 following a spontaneous menses or progestin-induced bleed.  Dispense: 5 tablet; Refill: 1  3. Skin irritation Recommended not to wear underwear at night or sometimes when at home to reduce inflammation.  - clotrimazole-betamethasone (LOTRISONE) cream; Apply topically 2 (two) times daily as needed.  Dispense: 45 g; Refill: 2 - nystatin (MYCOSTATIN/NYSTOP) powder; Apply topically 4 (four) times daily as needed.  Dispense: 60 g; Refill: 2   Routine preventative health maintenance measures emphasized. Please refer to After Visit Summary for other counseling recommendations.   Return in about 2 months (around 08/11/2021) for Followup.    I spent 15 minutes dedicated to the care of this patient including pre-visit review of records, face to face time with the patient discussing her conditions and treatments.    Jaynie Collins, MD, FACOG Obstetrician & Gynecologist, [facility] for [employer], [facility]

## 2021-08-02 ENCOUNTER — Ambulatory Visit (INDEPENDENT_AMBULATORY_CARE_PROVIDER_SITE_OTHER): Payer: Medicaid Other

## 2021-08-02 ENCOUNTER — Ambulatory Visit (HOSPITAL_COMMUNITY)
Admission: EM | Admit: 2021-08-02 | Discharge: 2021-08-02 | Disposition: A | Discharge status: Home / Self Care | Payer: Medicaid Other | Attending: Internal Medicine | Admitting: Internal Medicine

## 2021-08-02 ENCOUNTER — Other Ambulatory Visit: Payer: Self-pay

## 2021-08-02 ENCOUNTER — Encounter (HOSPITAL_COMMUNITY): Payer: Self-pay

## 2021-08-02 DIAGNOSIS — J45901 Unspecified asthma with (acute) exacerbation: Secondary | ICD-10-CM | POA: Diagnosis not present

## 2021-08-02 DIAGNOSIS — R079 Chest pain, unspecified: Secondary | ICD-10-CM

## 2021-08-02 DIAGNOSIS — R0602 Shortness of breath: Secondary | ICD-10-CM

## 2021-08-02 DIAGNOSIS — J069 Acute upper respiratory infection, unspecified: Secondary | ICD-10-CM

## 2021-08-02 MED ORDER — ALBUTEROL SULFATE (2.5 MG/3ML) 0.083% IN NEBU: 2.5000 mg | INHALATION_SOLUTION | Freq: Four times a day (QID) | RESPIRATORY_TRACT | 12 refills | Status: DC | PRN

## 2021-08-02 MED ORDER — ALBUTEROL SULFATE HFA 108 (90 BASE) MCG/ACT IN AERS: 1.0000 | INHALATION_SPRAY | Freq: Four times a day (QID) | RESPIRATORY_TRACT | 0 refills | Status: DC | PRN

## 2021-08-02 MED ORDER — PREDNISONE 10 MG (21) PO TBPK: ORAL_TABLET | Freq: Every day | ORAL | 0 refills | Status: DC

## 2021-08-02 NOTE — ED Triage Notes (Signed)
Pt in with c/o chest tightness that started last week after she was sick. Also c/o cough  States she cannot close her bra because her chest feels tight and when she takes a deep breath it doesn't feel as if her lungs are filling up  Pt has used inhalers but they are expired and she got no relief  Pt also requesting refill for nebulizer solution

## 2021-08-02 NOTE — Discharge Instructions (Addendum)
Your chest x-ray was negative for any acute cardiopulmonary process.  You have been prescribed prednisone taper to decrease inflammation in chest and help with shortness of breath.  Please take this medication with food.  Your albuterol inhaler nebulizer solution has been refilled.  Take this as prescribed and as needed as well to help with symptoms.  Please be advised that the nebulizer solution and albuterol inhaler are the same medications so please use them 4 to 6 hours apart.  Please go the hospital if any shortness of breath worsens or if chest pain develops.

## 2021-08-02 NOTE — ED Provider Notes (Addendum)
[facility]    CSN: [id number] Arrival date & time: 08/02/21  1808      History   Chief Complaint Chief Complaint  Patient presents with   Chest Pain    HPI Jessica Horton is a 30 y.o. female.   Patient presents to the urgent care for further evaluation for nasal congestion, shortness of breath, cough that has been present for approximately 2 weeks.  Nasal congestion has pretty much resolved per patient, but she has a chest tightness that has been present for a few days.  Patient states that she has a history of asthma, and these symptoms are consistent every time she gets an upper respiratory infection.  Denies any current shortness shortness of breath or chest pain.  Patient had prescription for albuterol inhaler but states they have expired and did not provide her any relief.  Did have 1 albuterol nebulizer solution leftover that she used that provided relief of shortness of breath and chest tightness.  Denies any known fevers at home.  Patient took an at home negative COVID-19 test when symptoms first started.   Chest Pain  Past Medical History:  Diagnosis Date   Asthma    Asthma    Headache(784.0)    Migraine     Patient Active Problem List   Diagnosis Date Noted   Asthma, chronic 01/22/2014   Migraines 01/22/2014    Past Surgical History:  Procedure Laterality Date   CESAREAN SECTION N/A 01/24/2014   Procedure: CESAREAN SECTION;  Surgeon: Esmeralda ArthurSandra A Rivard, MD;  Location: [facility];  Service: Obstetrics;  Laterality: N/A;   WISDOM TOOTH EXTRACTION      OB History     Gravida  1   Para  1   Term  1   Preterm  0   AB  0   Living  1      SAB  0   IAB  0   Ectopic  0   Multiple  0   Live Births  1            Home Medications    Prior to Admission medications   Medication Sig Start Date End Date Taking? Authorizing Provider  albuterol (PROVENTIL) (2.5 MG/3ML) 0.083% nebulizer solution Take 3 mLs (2.5 mg total) by  nebulization every 6 (six) hours as needed for wheezing or shortness of breath. 08/02/21  Yes Lance MussFowler, Landrum Carbonell E, FNP  albuterol (VENTOLIN HFA) 108 (90 Base) MCG/ACT inhaler Inhale 1-2 puffs into the lungs every 6 (six) hours as needed for wheezing or shortness of breath. 08/02/21  Yes Lance MussFowler, Shriya Aker E, FNP  predniSONE (STERAPRED UNI-PAK 21 TAB) 10 MG (21) TBPK tablet Take by mouth daily. Take 6 tabs by mouth daily  for 2 days, then 5 tabs for 2 days, then 4 tabs for 2 days, then 3 tabs for 2 days, 2 tabs for 2 days, then 1 tab by mouth daily for 2 days 08/02/21  Yes Lance MussFowler, Miko Markwood E, FNP  chlorhexidine (PERIDEX) 0.12 % solution Peridex 0.12 % mouthwash  Place 15 mL twice a day by mucous membrane route as directed for 30 days. Patient not taking: Reported on 06/11/2021 02/14/21   [provider]  clotrimazole-betamethasone (LOTRISONE) cream Apply topically 2 (two) times daily as needed. 06/11/21   Anyanwu, Jethro BastosUgonna A, MD  fluticasone (FLOVENT HFA) 110 MCG/ACT inhaler Inhale 2 puffs into the lungs 2 (two) times daily. 03/04/16   Marlon PelGreene, Tiffany, PA-C  ibuprofen (ADVIL,MOTRIN) 800 MG tablet  Take 1 tablet (800 mg total) by mouth 3 (three) times daily. 07/04/18   Garlon Hatchet, PA-C  letrozole [facility]) 2.5 MG tablet Take 1 tablet (2.5 mg total) by mouth daily. Take on days 3 to 7 following a spontaneous menses or progestin-induced bleed. 06/11/21   Anyanwu, Jethro Bastos, MD  naproxen (NAPROSYN) 500 MG tablet Take 1 tablet (500 mg total) by mouth 2 (two) times daily. 03/26/21   Ivette Loyal, NP  nystatin (MYCOSTATIN/NYSTOP) powder Apply topically 4 (four) times daily as needed. 06/11/21   Tereso Newcomer, MD    Family History Family History  Problem Relation Age of Onset   Diabetes Mother     Social History Social History   Tobacco Use   Smoking status: Never   Smokeless tobacco: Never  Vaping Use   Vaping Use: Never used  Substance Use Topics   Alcohol use: No   Drug use: No     Allergies    Patient has no known allergies.   Review of Systems Review of Systems Per HPI  Physical Exam Triage Vital Signs ED Triage Vitals  Enc Vitals Group     BP 08/02/21 1931 104/76     Pulse Rate 08/02/21 1931 81     Resp 08/02/21 1931 17     Temp 08/02/21 1931 98.1 F (36.7 C)     Temp Source 08/02/21 1931 Oral     SpO2 08/02/21 1931 97 %     Weight --      Height --      Head Circumference --      Peak Flow --      Pain Score 08/02/21 1929 8     Pain Loc --      Pain Edu? --      Excl. in GC? --    No data found.  Updated Vital Signs BP 104/76 (BP Location: Right Arm)   Pulse 81   Temp 98.1 F (36.7 C) (Oral)   Resp 17   SpO2 97%   Visual Acuity Right Eye Distance:   Left Eye Distance:   Bilateral Distance:    Right Eye Near:   Left Eye Near:    Bilateral Near:     Physical Exam Constitutional:      General: She is not in acute distress.    Appearance: Normal appearance. She is not toxic-appearing.  HENT:     Head: Normocephalic and atraumatic.     Right Ear: Tympanic membrane and ear canal normal.     Left Ear: Tympanic membrane and ear canal normal.     Nose: No congestion or rhinorrhea.     Mouth/Throat:     Mouth: Mucous membranes are moist.     Pharynx: No posterior oropharyngeal erythema.  Eyes:     Extraocular Movements: Extraocular movements intact.     Conjunctiva/sclera: Conjunctivae normal.     Pupils: Pupils are equal, round, and reactive to light.  Cardiovascular:     Rate and Rhythm: Normal rate and regular rhythm.     Pulses: Normal pulses.     Heart sounds: Normal heart sounds.  Pulmonary:     Effort: Pulmonary effort is normal. No respiratory distress.     Breath sounds: Normal breath sounds. No wheezing or rhonchi.  Abdominal:     General: Abdomen is flat. Bowel sounds are normal.     Palpations: Abdomen is soft.  Musculoskeletal:        General: Normal range of motion.  Cervical back: Normal range of motion.  Skin:     General: Skin is warm and dry.  Neurological:     General: No focal deficit present.     Mental Status: She is alert and oriented to person, place, and time. Mental status is at baseline.  Psychiatric:        Mood and Affect: Mood normal.        Behavior: Behavior normal.     UC Treatments / Results  Labs (all labs ordered are listed, but only abnormal results are displayed) Labs Reviewed - No data to display  EKG   Radiology DG Chest 2 View  Result Date: 08/02/2021 CLINICAL DATA:  Chest tightness, short of breath EXAM: CHEST - 2 VIEW COMPARISON:  07/04/2018 FINDINGS: The heart size and mediastinal contours are within normal limits. Both lungs are clear. The visualized skeletal structures are unremarkable. IMPRESSION: No active cardiopulmonary disease. Electronically Signed   By: Sharlet Salina M.D.   On: 08/02/2021 20:15    Procedures Procedures (including critical care time)  Medications Ordered in UC Medications - No data to display  Initial Impression / Assessment and Plan / UC Course  I have reviewed the triage vital signs and the nursing notes.  Pertinent labs & imaging results that were available during my care of the patient were reviewed by me and considered in my medical decision making (see chart for details).     Chest x-ray was negative for any acute cardiopulmonary process.  Suspect chest tightness and shortness of breath is related to acute asthma exacerbation related to respiratory infection.  Will treat with prednisone taper and refill albuterol inhaler and albuterol nebulizer.  Advised patient that inhaler and nebulizer solution are same medication and to use at least 4 to 6 hours apart.  Patient was advised to go to the hospital if shortness of breath or chest tightness worsen.  Do not suspect any worrisome clinical diagnoses including PE or cardiac related issues due to mechanism of symptoms and patient history of asthma.  COVID-19 retest not necessary at this  time.Discussed strict return precautions. Patient verbalized understanding and is agreeable with plan.  Final Clinical Impressions(s) / UC Diagnoses   Final diagnoses:  Acute upper respiratory infection  Mild asthma with acute exacerbation, unspecified whether persistent  Shortness of breath     Discharge Instructions      Your chest x-ray was negative for any acute cardiopulmonary process.  You have been prescribed prednisone taper to decrease inflammation in chest and help with shortness of breath.  Please take this medication with food.  Your albuterol inhaler nebulizer solution has been refilled.  Take this as prescribed and as needed as well to help with symptoms.  Please be advised that the nebulizer solution and albuterol inhaler are the same medications so please use them 4 to 6 hours apart.  Please go the hospital if any shortness of breath worsens or if chest pain develops.     ED Prescriptions     Medication Sig Dispense Auth. Provider   albuterol (VENTOLIN HFA) 108 (90 Base) MCG/ACT inhaler Inhale 1-2 puffs into the lungs every 6 (six) hours as needed for wheezing or shortness of breath. 1 each Lance Muss, FNP   albuterol (PROVENTIL) (2.5 MG/3ML) 0.083% nebulizer solution Take 3 mLs (2.5 mg total) by nebulization every 6 (six) hours as needed for wheezing or shortness of breath. 75 mL Lance Muss, FNP   predniSONE (STERAPRED UNI-PAK 21 TAB) 10 MG (  21) TBPK tablet Take by mouth daily. Take 6 tabs by mouth daily  for 2 days, then 5 tabs for 2 days, then 4 tabs for 2 days, then 3 tabs for 2 days, 2 tabs for 2 days, then 1 tab by mouth daily for 2 days 42 tablet Lance Muss, FNP      PDMP not reviewed this encounter.   Lance Muss, FNP 08/02/21 2041    Lance Muss, FNP 08/02/21 2041

## 2021-08-23 ENCOUNTER — Other Ambulatory Visit: Payer: Self-pay | Admitting: Obstetrics & Gynecology

## 2021-08-23 DIAGNOSIS — N979 Female infertility, unspecified: Secondary | ICD-10-CM

## 2021-08-23 DIAGNOSIS — E349 Endocrine disorder, unspecified: Secondary | ICD-10-CM

## 2021-08-23 DIAGNOSIS — R7989 Other specified abnormal findings of blood chemistry: Secondary | ICD-10-CM

## 2021-08-28 MED ORDER — LETROZOLE 2.5 MG PO TABS: 5.0000 mg | ORAL_TABLET | ORAL | 1 refills | Status: DC

## 2021-08-28 NOTE — Telephone Encounter (Signed)
Patient sent in refill request for Letrozole. Increased dosage to 5 mg for next two months. Will continue to monitor.  Jaynie Collins, MD

## 2021-11-19 ENCOUNTER — Other Ambulatory Visit: Payer: Self-pay | Admitting: Obstetrics & Gynecology

## 2021-11-19 DIAGNOSIS — R7989 Other specified abnormal findings of blood chemistry: Secondary | ICD-10-CM

## 2021-11-19 DIAGNOSIS — N979 Female infertility, unspecified: Secondary | ICD-10-CM

## 2021-11-20 NOTE — Telephone Encounter (Signed)
Patient sent in refill request for Letrozole. Increased dosage to 7.5 mg for next two months. Will continue to monitor.  Jaynie Collins, MD

## 2021-12-10 ENCOUNTER — Other Ambulatory Visit (HOSPITAL_COMMUNITY): Payer: Self-pay

## 2021-12-11 ENCOUNTER — Other Ambulatory Visit (HOSPITAL_COMMUNITY): Payer: Self-pay

## 2021-12-11 MED ORDER — LETROZOLE 2.5 MG PO TABS
ORAL_TABLET | ORAL | 1 refills | Status: DC
  Filled 2021-12-11: qty 15, 5d supply, fill #0

## 2021-12-11 MED ORDER — ALBUTEROL SULFATE (2.5 MG/3ML) 0.083% IN NEBU
3.0000 mL | INHALATION_SOLUTION | Freq: Four times a day (QID) | RESPIRATORY_TRACT | 12 refills | Status: DC | PRN
  Filled 2021-12-11: qty 75, 7d supply, fill #0

## 2021-12-14 ENCOUNTER — Encounter: Payer: Self-pay | Admitting: Obstetrics & Gynecology

## 2021-12-14 DIAGNOSIS — N979 Female infertility, unspecified: Secondary | ICD-10-CM

## 2021-12-18 ENCOUNTER — Other Ambulatory Visit (HOSPITAL_COMMUNITY): Payer: Self-pay

## 2021-12-18 MED ORDER — LETROZOLE 2.5 MG PO TABS
7.5000 mg | ORAL_TABLET | Freq: Every day | ORAL | 1 refills | Status: DC
  Filled 2021-12-20: qty 15, 5d supply, fill #0
  Filled 2022-02-04: qty 15, 5d supply, fill #1

## 2021-12-19 ENCOUNTER — Other Ambulatory Visit (HOSPITAL_COMMUNITY): Payer: Self-pay

## 2021-12-19 ENCOUNTER — Other Ambulatory Visit: Payer: Self-pay

## 2021-12-19 ENCOUNTER — Ambulatory Visit (INDEPENDENT_AMBULATORY_CARE_PROVIDER_SITE_OTHER): Payer: No Typology Code available for payment source | Admitting: Nurse Practitioner

## 2021-12-19 ENCOUNTER — Encounter: Payer: Self-pay | Admitting: Nurse Practitioner

## 2021-12-19 VITALS — BP 112/72 | HR 84 | Temp 97.3°F | Ht 62.0 in | Wt 185.0 lb

## 2021-12-19 DIAGNOSIS — Z136 Encounter for screening for cardiovascular disorders: Secondary | ICD-10-CM | POA: Diagnosis not present

## 2021-12-19 DIAGNOSIS — Z13220 Encounter for screening for lipoid disorders: Secondary | ICD-10-CM

## 2021-12-19 DIAGNOSIS — Z0000 Encounter for general adult medical examination without abnormal findings: Secondary | ICD-10-CM | POA: Diagnosis not present

## 2021-12-19 NOTE — Progress Notes (Signed)
Subjective:    Patient ID: Jessica Horton, female    DOB: 1991/03/23, 30 y.o.   MRN: [id number]  Patient presents today for CPE  HPI  Vision:up to date Dental:up to date Diet:regular Exercise:1-2x /week, zumba class, track Weight:  Wt Readings from Last 3 Encounters:  12/19/21 185 lb (83.9 kg)  06/11/21 176 lb 11.2 oz (80.2 kg)  03/14/21 176 lb (79.8 kg)    Sexual History (orientation,birth control, marital status, STD):breast and pelvic exam completed by GYN,   Depression/Suicide: Depression screen [facility] 2/9 12/19/2021 06/11/2021 03/14/2021 12/13/2020  Decreased Interest 0 0 0 0  Down, Depressed, Hopeless 0 0 0 0  PHQ - 2 Score 0 0 0 0  Altered sleeping - 0 0 0  Tired, decreased energy - 0 0 0  Change in appetite - 0 0 0  Feeling bad or failure about yourself  - 0 0 0  Trouble concentrating - 0 0 0  Moving slowly or fidgety/restless - 0 0 0  Suicidal thoughts - 0 0 0  PHQ-9 Score - 0 0 0   Immunizations: (TDAP, Hep C screen, Pneumovax, Influenza, zoster)  Health Maintenance  Topic Date Due   Pneumococcal Vaccination (1 - PCV) Never done   Hepatitis C Screening: USPSTF Recommendation to screen - Ages 49-79 yo.  Never done   COVID-19 Vaccine (3 - Booster for Pfizer series) 11/27/2020   Pap Smear  12/14/2023   Pap Smear  12/14/2023   Tetanus Vaccine  09/14/2028   Flu Shot  Completed   HIV Screening  Completed   HPV Vaccine  Aged Out   Fall Risk: Fall Risk  12/19/2021 12/31/2016  Falls in the past year? 0 No  Number falls in past yr: 0 -  Injury with Fall? 1 -  Risk for fall due to : History of fall(s) -  Follow up Falls evaluation completed -   Medications and allergies reviewed with patient and updated if appropriate.  Patient Active Problem List   Diagnosis Date Noted   Asthma, chronic 01/22/2014   Migraines 01/22/2014   Current Outpatient Medications on File Prior to Visit  Medication Sig Dispense Refill   albuterol (PROVENTIL) (2.5 MG/3ML) 0.083%  nebulizer solution Take 3 mLs (2.5 mg total) by nebulization every 6 (six) hours as needed for wheezing or shortness of breath. 75 mL 12   albuterol (PROVENTIL) (2.5 MG/3ML) 0.083% nebulizer solution Take 3 mLs by nebulization every 6 (six) hours as needed for wheezing or shortness of breath 75 mL 12   albuterol (VENTOLIN HFA) 108 (90 Base) MCG/ACT inhaler Inhale 1-2 puffs into the lungs every 6 (six) hours as needed for wheezing or shortness of breath. 1 each 0   clotrimazole-betamethasone (LOTRISONE) cream Apply topically 2 (two) times daily as needed. 45 g 2   fluticasone (FLOVENT HFA) 110 MCG/ACT inhaler Inhale 2 puffs into the lungs 2 (two) times daily. 1 Inhaler 1   ibuprofen (ADVIL,MOTRIN) 800 MG tablet Take 1 tablet (800 mg total) by mouth 3 (three) times daily. 21 tablet 0   letrozole (FEMARA) 2.5 MG tablet Take 3 tablets (7.5 mg total) by mouth daily. 15 tablet 1   naproxen (NAPROSYN) 500 MG tablet Take 1 tablet (500 mg total) by mouth 2 (two) times daily. 30 tablet 0   nystatin (MYCOSTATIN/NYSTOP) powder Apply topically 4 (four) times daily as needed. 60 g 2   No current facility-administered medications on file prior to visit.    Past Medical History:  Diagnosis Date   Asthma    Asthma    Headache(784.0)    Migraine     Past Surgical History:  Procedure Laterality Date   CESAREAN SECTION N/A 01/24/2014   Procedure: CESAREAN SECTION;  Surgeon: Esmeralda Arthur, MD;  Location: [facility];  Service: Obstetrics;  Laterality: N/A;   WISDOM TOOTH EXTRACTION      Social History   Socioeconomic History   Marital status: Married    Spouse name: Not on file   Number of children: Not on file   Years of education: Not on file   Highest education level: Not on file  Occupational History   Not on file  Tobacco Use   Smoking status: Never   Smokeless tobacco: Never  Vaping Use   Vaping Use: Never used  Substance and Sexual Activity   Alcohol use: No   Drug use: No   Sexual  activity: Yes    Birth control/protection: None  Other Topics Concern   Not on file  Social History Narrative   Not on file   Social Determinants of Health   Financial Resource Strain: Not on file  Food Insecurity: No Food Insecurity   Worried About Running Out of Food in the Last Year: Never true   Ran Out of Food in the Last Year: Never true  Transportation Needs: No Transportation Needs   Lack of Transportation (Medical): No   Lack of Transportation (Non-Medical): No  Physical Activity: Not on file  Stress: Not on file  Social Connections: Not on file    Family History  Problem Relation Age of Onset   Diabetes Mother    Heart disease Father 19       CAD/MI   Diabetes Mellitus I Sister    Diabetes Maternal Aunt    Diabetes Maternal Uncle        Review of Systems  Constitutional:  Negative for fever, malaise/fatigue and weight loss.  HENT:  Negative for congestion and sore throat.   Eyes:        Negative for visual changes  Respiratory:  Negative for cough and shortness of breath.   Cardiovascular:  Negative for chest pain, palpitations and leg swelling.  Gastrointestinal:  Negative for blood in stool, constipation, diarrhea and heartburn.  Genitourinary:  Negative for dysuria, frequency and urgency.  Musculoskeletal:  Negative for falls, joint pain and myalgias.  Skin:  Negative for rash.  Neurological:  Negative for dizziness, sensory change and headaches.  Endo/Heme/Allergies:  Does not bruise/bleed easily.  Psychiatric/Behavioral:  Negative for depression, substance abuse and suicidal ideas. The patient is not nervous/anxious.    Objective:   Vitals:   12/19/21 1438  BP: 112/72  Pulse: 84  Temp: (!) 97.3 F (36.3 C)  SpO2: 98%    Body mass index is 33.84 kg/m.   Physical Examination:  Physical Exam Vitals reviewed.  Constitutional:      General: She is not in acute distress. HENT:     Right Ear: Tympanic membrane, ear canal and external ear  normal.     Left Ear: Tympanic membrane, ear canal and external ear normal.  Eyes:     General: No scleral icterus.    Extraocular Movements: Extraocular movements intact.     Conjunctiva/sclera: Conjunctivae normal.  Cardiovascular:     Rate and Rhythm: Normal rate and regular rhythm.     Pulses: Normal pulses.     Heart sounds: Normal heart sounds.  Pulmonary:     Effort: Pulmonary  effort is normal. No respiratory distress.     Breath sounds: Normal breath sounds.  Abdominal:     General: Bowel sounds are normal. There is no distension.     Palpations: Abdomen is soft.  Genitourinary:    Comments: Breast and pelvic exam deferred to GYN Musculoskeletal:        General: Normal range of motion.     Cervical back: Normal range of motion and neck supple.     Right lower leg: No edema.     Left lower leg: No edema.  Lymphadenopathy:     Cervical: No cervical adenopathy.  Skin:    General: Skin is warm and dry.  Neurological:     Mental Status: She is alert and oriented to person, place, and time.  Psychiatric:        Mood and Affect: Mood normal.        Behavior: Behavior normal.        Thought Content: Thought content normal.   ASSESSMENT and PLAN: This visit occurred during the SARS-CoV-2 public health emergency.  Safety protocols were in place, including screening questions prior to the visit, additional usage of staff PPE, and extensive cleaning of exam room while observing appropriate contact time as indicated for disinfecting solutions.   Renea was seen today for establish care.  Diagnoses and all orders for this visit:  Preventative health care -     Comprehensive metabolic panel; Future -     CBC with Differential/Platelet; Future -     TSH; Future -     Lipid panel; Future  Encounter for lipid screening for cardiovascular disease -     Lipid panel; Future      Problem List Items Addressed This Visit   None Visit Diagnoses     Preventative health care     -  Primary   Relevant Orders   Comprehensive metabolic panel   CBC with Differential/Platelet   TSH   Lipid panel   Encounter for lipid screening for cardiovascular disease       Relevant Orders   Lipid panel       Follow up: Return in about 1 year (around 12/19/2022) for CPE (fasting).  [name], NP

## 2021-12-19 NOTE — Patient Instructions (Addendum)
Thank you for choosing [facility]  Go to lab for blood draw  Schedule appt for fasting blood draw. Need to be fasting 8hrs prior to blood draw. Ok to drink water.  Preventive Care 70-28 Years Old, Female Preventive care refers to lifestyle choices and visits with your health care provider that can promote health and wellness. Preventive care visits are also called wellness exams. What can I expect for my preventive care visit? Counseling During your preventive care visit, your health care provider may ask about your: Medical history, including: Past medical problems. Family medical history. Pregnancy history. Current health, including: Menstrual cycle. Method of birth control. Emotional well-being. Home life and relationship well-being. Sexual activity and sexual health. Lifestyle, including: Alcohol, nicotine or tobacco, and drug use. Access to firearms. Diet, exercise, and sleep habits. Work and work Statistician. Sunscreen use. Safety issues such as seatbelt and bike helmet use. Physical exam Your health care provider may check your: Height and weight. These may be used to calculate your BMI (body mass index). BMI is a measurement that tells if you are at a healthy weight. Waist circumference. This measures the distance around your waistline. This measurement also tells if you are at a healthy weight and may help predict your risk of certain diseases, such as type 2 diabetes and high blood pressure. Heart rate and blood pressure. Body temperature. Skin for abnormal spots. What immunizations do I need? Vaccines are usually given at various ages, according to a schedule. Your health care provider will recommend vaccines for you based on your age, medical history, and lifestyle or other factors, such as travel or where you work. What tests do I need? Screening Your health care provider may recommend screening tests for certain conditions. This may include: Pelvic  exam and Pap test. Lipid and cholesterol levels. Diabetes screening. This is done by checking your blood sugar (glucose) after you have not eaten for a while (fasting). Hepatitis B test. Hepatitis C test. HIV (human immunodeficiency virus) test. STI (sexually transmitted infection) testing, if you are at risk. BRCA-related cancer screening. This may be done if you have a family history of breast, ovarian, tubal, or peritoneal cancers. Talk with your health care provider about your test results, treatment options, and if necessary, the need for more tests. Follow these instructions at home: Eating and drinking  Eat a healthy diet that includes fresh fruits and vegetables, whole grains, lean protein, and low-fat dairy products. Take vitamin and mineral supplements as recommended by your health care provider. Do not drink alcohol if: Your health care provider tells you not to drink. You are pregnant, may be pregnant, or are planning to become pregnant. If you drink alcohol: Limit how much you have to 0-1 drink a day. Know how much alcohol is in your drink. In the [place] one drink equals one 12 oz bottle of beer (355 mL), one 5 oz glass of wine (148 mL), or one 1 oz glass of hard liquor (44 mL). Lifestyle Brush your teeth every morning and night with fluoride toothpaste. Floss one time each day. Exercise for at least 30 minutes 5 or more days each week. Do not use any products that contain nicotine or tobacco. These products include cigarettes, chewing tobacco, and vaping devices, such as e-cigarettes. If you need help quitting, ask your health care provider. Do not use drugs. If you are sexually active, practice safe sex. Use a condom or other form of protection to prevent STIs. If you do not  wish to become pregnant, use a form of birth control. If you plan to become pregnant, see your health care provider for a prepregnancy visit. Find healthy ways to manage stress, such as: Meditation,  yoga, or listening to music. Journaling. Talking to a trusted person. Spending time with friends and family. Minimize exposure to UV radiation to reduce your risk of skin cancer. Safety Always wear your seat belt while driving or riding in a vehicle. Do not drive: If you have been drinking alcohol. Do not ride with someone who has been drinking. If you have been using any mind-altering substances or drugs. While texting. When you are tired or distracted. Wear a helmet and other protective equipment during sports activities. If you have firearms in your house, make sure you follow all gun safety procedures. Seek help if you have been physically or sexually abused. What's next? Go to your health care provider once a year for an annual wellness visit. Ask your health care provider how often you should have your eyes and teeth checked. Stay up to date on all vaccines. This information is not intended to replace advice given to you by your health care provider. Make sure you discuss any questions you have with your health care provider. Document Revised: 06/13/2021 Document Reviewed: 06/13/2021 Elsevier Patient Education  [place].

## 2021-12-20 ENCOUNTER — Encounter: Payer: Self-pay | Admitting: Nurse Practitioner

## 2021-12-20 ENCOUNTER — Telehealth: Payer: Self-pay | Admitting: Nurse Practitioner

## 2021-12-20 ENCOUNTER — Other Ambulatory Visit (HOSPITAL_COMMUNITY): Payer: Self-pay

## 2021-12-20 MED ORDER — LETROZOLE 2.5 MG PO TABS
7.5000 mg | ORAL_TABLET | Freq: Every day | ORAL | 1 refills | Status: DC
  Filled 2021-12-20: qty 15, 5d supply, fill #0

## 2021-12-20 NOTE — Addendum Note (Signed)
Addended by: Willaim Bane on: 12/20/2021 04:49 PM   Modules accepted: Orders

## 2021-12-20 NOTE — Telephone Encounter (Signed)
Pt called earlier, said doctor ordered labs for her. Wanting to know can she do labs at her location & have the [facility] results sent to her.--KR

## 2021-12-21 ENCOUNTER — Other Ambulatory Visit (HOSPITAL_COMMUNITY): Payer: Self-pay

## 2021-12-21 MED ORDER — LETROZOLE 2.5 MG PO TABS
7.5000 mg | ORAL_TABLET | Freq: Every day | ORAL | 1 refills | Status: DC
Start: 2021-12-20 — End: 2022-07-30
  Filled 2022-07-10: qty 15, 5d supply, fill #0

## 2021-12-27 NOTE — Telephone Encounter (Signed)
Patient had labs done with [facility] this morning

## 2021-12-28 ENCOUNTER — Ambulatory Visit: Payer: Medicaid Other | Admitting: Nurse Practitioner

## 2021-12-28 LAB — COMPREHENSIVE METABOLIC PANEL
AG Ratio: 1.5 (calc) (ref 1.0–2.5)
ALT: 15 U/L (ref 6–29)
AST: 13 U/L (ref 10–30)
Albumin: 4.2 g/dL (ref 3.6–5.1)
Alkaline phosphatase (APISO): 72 U/L (ref 31–125)
BUN: 13 mg/dL (ref 7–25)
CO2: 23 mmol/L (ref 20–32)
Calcium: 9.1 mg/dL (ref 8.6–10.2)
Chloride: 106 mmol/L (ref 98–110)
Creat: 0.57 mg/dL (ref 0.50–0.96)
Globulin: 2.8 g/dL (calc) (ref 1.9–3.7)
Glucose, Bld: 91 mg/dL (ref 65–99)
Potassium: 4.4 mmol/L (ref 3.5–5.3)
Sodium: 137 mmol/L (ref 135–146)
Total Bilirubin: 0.6 mg/dL (ref 0.2–1.2)
Total Protein: 7 g/dL (ref 6.1–8.1)

## 2021-12-28 LAB — LIPID PANEL
Cholesterol: 163 mg/dL (ref <200)
HDL: 55 mg/dL (ref >=50)
LDL Cholesterol (Calc): 94 mg/dL (calc)
Non-HDL Cholesterol (Calc): 108 mg/dL (calc) (ref <130)
Total CHOL/HDL Ratio: 3 (calc) (ref <5.0)
Triglycerides: 54 mg/dL (ref <150)

## 2021-12-28 LAB — CBC WITH DIFFERENTIAL/PLATELET
Absolute Monocytes: 442 cells/uL (ref 200–950)
Basophils Absolute: 41 cells/uL (ref 0–200)
Basophils Relative: 0.6 %
Eosinophils Absolute: 190 cells/uL (ref 15–500)
Eosinophils Relative: 2.8 %
HCT: 38.8 % (ref 35.0–45.0)
Hemoglobin: 12.8 g/dL (ref 11.7–15.5)
Lymphs Abs: 2394 cells/uL (ref 850–3900)
MCH: 29.6 pg (ref 27.0–33.0)
MCHC: 33 g/dL (ref 32.0–36.0)
MCV: 89.6 fL (ref 80.0–100.0)
MPV: 11.1 fL (ref 7.5–12.5)
Monocytes Relative: 6.5 %
Neutro Abs: 3733 cells/uL (ref 1500–7800)
Neutrophils Relative %: 54.9 %
Platelets: 267 10*3/uL (ref 140–400)
RBC: 4.33 10*6/uL (ref 3.80–5.10)
RDW: 11.8 % (ref 11.0–15.0)
Total Lymphocyte: 35.2 %
WBC: 6.8 10*3/uL (ref 3.8–10.8)

## 2021-12-28 LAB — TSH: TSH: 1.68 mIU/L

## 2022-01-09 ENCOUNTER — Other Ambulatory Visit (HOSPITAL_COMMUNITY): Payer: Self-pay

## 2022-01-10 ENCOUNTER — Other Ambulatory Visit (HOSPITAL_COMMUNITY): Payer: Self-pay

## 2022-01-10 MED ORDER — AMOXICILLIN 500 MG PO CAPS
500.0000 mg | ORAL_CAPSULE | Freq: Three times a day (TID) | ORAL | 0 refills | Status: DC
  Filled 2022-01-10: qty 21, 7d supply, fill #0

## 2022-02-04 ENCOUNTER — Other Ambulatory Visit (HOSPITAL_COMMUNITY): Payer: Self-pay

## 2022-02-05 ENCOUNTER — Other Ambulatory Visit (HOSPITAL_COMMUNITY): Payer: Self-pay

## 2022-02-13 ENCOUNTER — Other Ambulatory Visit (HOSPITAL_COMMUNITY): Payer: Self-pay

## 2022-03-10 ENCOUNTER — Ambulatory Visit (HOSPITAL_COMMUNITY)
Admission: EM | Admit: 2022-03-10 | Discharge: 2022-03-10 | Disposition: A | Discharge status: Home / Self Care | Payer: No Typology Code available for payment source | Attending: Family Medicine | Admitting: Family Medicine

## 2022-03-10 ENCOUNTER — Other Ambulatory Visit: Payer: Self-pay

## 2022-03-10 ENCOUNTER — Encounter (HOSPITAL_COMMUNITY): Payer: Self-pay

## 2022-03-10 DIAGNOSIS — J020 Streptococcal pharyngitis: Secondary | ICD-10-CM

## 2022-03-10 LAB — POCT RAPID STREP A, ED / UC: Streptococcus, Group A Screen (Direct): POSITIVE — AB

## 2022-03-10 MED ORDER — AMOXICILLIN 875 MG PO TABS: 875.0000 mg | ORAL_TABLET | Freq: Two times a day (BID) | ORAL | 0 refills | Status: DC

## 2022-03-10 MED ORDER — AMOXICILLIN 875 MG PO TABS: 875.0000 mg | ORAL_TABLET | Freq: Two times a day (BID) | ORAL | 0 refills | Status: AC

## 2022-03-10 MED ORDER — IBUPROFEN 800 MG PO TABS: 800.0000 mg | ORAL_TABLET | Freq: Three times a day (TID) | ORAL | 0 refills | Status: DC | PRN

## 2022-03-10 NOTE — Discharge Instructions (Addendum)
Your strep test was positive ?Take amoxicillin 875 mg--1 tab twice daily for 10 days ? ?Take ibuprofen 800 mg 1 every 8 hours as needed for pain ? ?Drink plenty of fluids especially cool things ?

## 2022-03-10 NOTE — ED Triage Notes (Signed)
Onset this morning upon waking of sore throat, chills, runny nose and pain with swallowing. Confirms hoarseness. Denies cough and congestion. Has been taking ibuprofen without relief. No v/d.  ?

## 2022-03-10 NOTE — ED Provider Notes (Signed)
?[facility] ? ? ? ?CSN: [id number] ?Arrival date & time: 03/10/22  1649 ? ? ?  ? ?History   ?Chief Complaint ?Chief Complaint  ?Patient presents with  ? Sore Throat  ? ? ?HPI ?Jessica Horton is a 31 y.o. female.  ? ? ?Sore Throat ? ?Here with sore throat nasal congestion and malaise that began this morning.  She is also felt chilled and has taken some ibuprofen.  No vomiting or diarrhea.  Not really much cough.  She does feel like she has postnasal drainage and that her tonsils feel swollen. ? ?Past Medical History:  ?Diagnosis Date  ? Asthma   ? Asthma   ? Headache(784.0)   ? Migraine   ? ? ?Patient Active Problem List  ? Diagnosis Date Noted  ? Asthma, chronic 01/22/2014  ? Migraines 01/22/2014  ? ? ?Past Surgical History:  ?Procedure Laterality Date  ? CESAREAN SECTION N/A 01/24/2014  ? Procedure: CESAREAN SECTION;  Surgeon: Esmeralda ArthurSandra A Rivard, MD;  Location: [facility];  Service: Obstetrics;  Laterality: N/A;  ? WISDOM TOOTH EXTRACTION    ? ? ?OB History   ? ? Gravida  ?1  ? Para  ?1  ? Term  ?1  ? Preterm  ?0  ? AB  ?0  ? Living  ?1  ?  ? ? SAB  ?0  ? IAB  ?0  ? Ectopic  ?0  ? Multiple  ?0  ? Live Births  ?1  ?   ?  ?  ? ? ? ?Home Medications   ? ?Prior to Admission medications   ?Medication Sig Start Date End Date Taking? Authorizing Provider  ?ibuprofen (ADVIL) 800 MG tablet Take 1 tablet (800 mg total) by mouth every 8 (eight) hours as needed (pain). 03/10/22  Yes Zenia ResidesBanister, Marton Malizia K, MD  ?albuterol (PROVENTIL) (2.5 MG/3ML) 0.083% nebulizer solution Take 3 mLs (2.5 mg total) by nebulization every 6 (six) hours as needed for wheezing or shortness of breath. 08/02/21   Gustavus BryantMound, Haley E, FNP  ?albuterol (PROVENTIL) (2.5 MG/3ML) 0.083% nebulizer solution Take 3 mLs by nebulization every 6 (six) hours as needed for wheezing or shortness of breath 08/02/21   Gustavus BryantMound, Haley E, FNP  ?albuterol (VENTOLIN HFA) 108 (90 Base) MCG/ACT inhaler Inhale 1-2 puffs into the lungs every 6 (six) hours as needed for wheezing or  shortness of breath. 08/02/21   Gustavus BryantMound, Haley E, FNP  ?amoxicillin (AMOXIL) 875 MG tablet Take 1 tablet (875 mg total) by mouth 2 (two) times daily for 10 days. 03/10/22 03/20/22  Zenia ResidesBanister, Kailyn Vanderslice K, MD  ?fluticasone (FLOVENT HFA) 110 MCG/ACT inhaler Inhale 2 puffs into the lungs 2 (two) times daily. 03/04/16   Marlon PelGreene, Tiffany, PA-C  ?letrozole [facility](FEMARA) 2.5 MG tablet Take 3 tablets (7.5 mg total) by mouth daily. 12/18/21   Tereso NewcomerAnyanwu, Ugonna A, MD  ?letrozole (FEMARA) 2.5 MG tablet Take 3 tablets (7.5 mg total) by mouth daily. 12/20/21     ? ? ?Family History ?Family History  ?Problem Relation Age of Onset  ? Diabetes Mother   ? Heart disease Father [id number]  ?     CAD/MI  ? Diabetes Mellitus I Sister   ? Diabetes Maternal Aunt   ? Diabetes Maternal Uncle   ? ? ?Social History ?Social History  ? ?Tobacco Use  ? Smoking status: Never  ? Smokeless tobacco: Never  ?Vaping Use  ? Vaping Use: Never used  ?Substance Use Topics  ? Alcohol use: No  ?  Drug use: No  ? ? ? ?Allergies   ?Patient has no known allergies. ? ? ?Review of Systems ?Review of Systems ? ? ?Physical Exam ?Triage Vital Signs ?ED Triage Vitals  ?Enc Vitals Group  ?   BP 03/10/22 1702 115/76  ?   Pulse Rate 03/10/22 1702 (!) 106  ?   Resp --   ?   Temp 03/10/22 1702 98.4 ?F (36.9 ?C)  ?   Temp Source 03/10/22 1702 Oral  ?   SpO2 03/10/22 1702 97 %  ?   Weight --   ?   Height --   ?   Head Circumference --   ?   Peak Flow --   ?   Pain Score 03/10/22 1701 9  ?   Pain Loc --   ?   Pain Edu? --   ?   Excl. in GC? --   ? ?No data found. ? ?Updated Vital Signs ?BP 115/76 (BP Location: Right Arm)   Pulse (!) 106   Temp 98.4 ?F (36.9 ?C) (Oral)   SpO2 97%  ? ?Visual Acuity ?Right Eye Distance:   ?Left Eye Distance:   ?Bilateral Distance:   ? ?Right Eye Near:   ?Left Eye Near:    ?Bilateral Near:    ? ?Physical Exam ?Vitals reviewed.  ?Constitutional:   ?   General: She is not in acute distress. ?   Appearance: She is not toxic-appearing.  ?HENT:  ?   Right Ear: Tympanic  membrane and ear canal normal.  ?   Left Ear: Tympanic membrane and ear canal normal.  ?   Nose: Nose normal. No congestion or rhinorrhea.  ?   Mouth/Throat:  ?   Mouth: Mucous membranes are moist.  ?   Comments: Tonsils are 3+ enlarged and have white and yellow exudate on the crypts ?Eyes:  ?   Extraocular Movements: Extraocular movements intact.  ?   Conjunctiva/sclera: Conjunctivae normal.  ?   Pupils: Pupils are equal, round, and reactive to light.  ?Cardiovascular:  ?   Rate and Rhythm: Normal rate and regular rhythm.  ?   Heart sounds: No murmur heard. ?Pulmonary:  ?   Effort: Pulmonary effort is normal. No respiratory distress.  ?   Breath sounds: No wheezing, rhonchi or rales.  ?Musculoskeletal:  ?   Cervical back: Neck supple.  ?Lymphadenopathy:  ?   Cervical: No cervical adenopathy.  ?Skin: ?   Capillary Refill: Capillary refill takes less than 2 seconds.  ?   Coloration: Skin is not jaundiced or pale.  ?Neurological:  ?   General: No focal deficit present.  ?   Mental Status: She is alert and oriented to person, place, and time.  ?Psychiatric:     ?   Behavior: Behavior normal.  ? ? ? ?UC Treatments / Results  ?Labs ?(all labs ordered are listed, but only abnormal results are displayed) ?Labs Reviewed  ?POCT RAPID STREP A, ED / UC - Abnormal; Notable for the following components:  ?    Result Value  ? Streptococcus, Group A Screen (Direct) POSITIVE (*)   ? All other components within normal limits  ? ? ?EKG ? ? ?Radiology ?No results found. ? ?Procedures ?Procedures (including critical care time) ? ?Medications Ordered in UC ?Medications - No data to display ? ?Initial Impression / Assessment and Plan / UC Course  ?I have reviewed the triage vital signs and the nursing notes. ? ?Pertinent labs & imaging results that  were available during my care of the patient were reviewed by me and considered in my medical decision making (see chart for details). ? ?  ? ?Rapid strep test is positive.  We will treat with  amoxicillin and pain relief. ?Final Clinical Impressions(s) / UC Diagnoses  ? ?Final diagnoses:  ?Strep pharyngitis  ? ? ? ?Discharge Instructions   ? ?  ?Your strep test was positive ?Take amoxicillin 875 mg--1 tab twice daily for 10 days ? ?Take ibuprofen 800 mg 1 every 8 hours as needed for pain ? ?Drink plenty of fluids especially cool things ? ? ? ? ?ED Prescriptions   ? ? Medication Sig Dispense Auth. Provider  ? amoxicillin (AMOXIL) 875 MG tablet  (Status: Discontinued) Take 1 tablet (875 mg total) by mouth 2 (two) times daily for 7 days. 14 tablet Ahamed Hofland, Janace Aris, MD  ? ibuprofen (ADVIL) 800 MG tablet Take 1 tablet (800 mg total) by mouth every 8 (eight) hours as needed (pain). 21 tablet Zenia Resides, MD  ? amoxicillin (AMOXIL) 875 MG tablet Take 1 tablet (875 mg total) by mouth 2 (two) times daily for 10 days. 20 tablet Zenia Resides, MD  ? ?  ? ?PDMP not reviewed this encounter. ?  ?Zenia Resides, MD ?03/10/22 1754 ? ?

## 2022-03-15 ENCOUNTER — Telehealth: Payer: No Typology Code available for payment source | Admitting: Nurse Practitioner

## 2022-03-24 IMAGING — DX DG CHEST 2V
2 series · 2 of 2 positions shown · non-contrast
Comparison: 07/04/2018

CLINICAL DATA: Chest tightness, short of breath

EXAM:
CHEST - 2 VIEW

[chest ap]
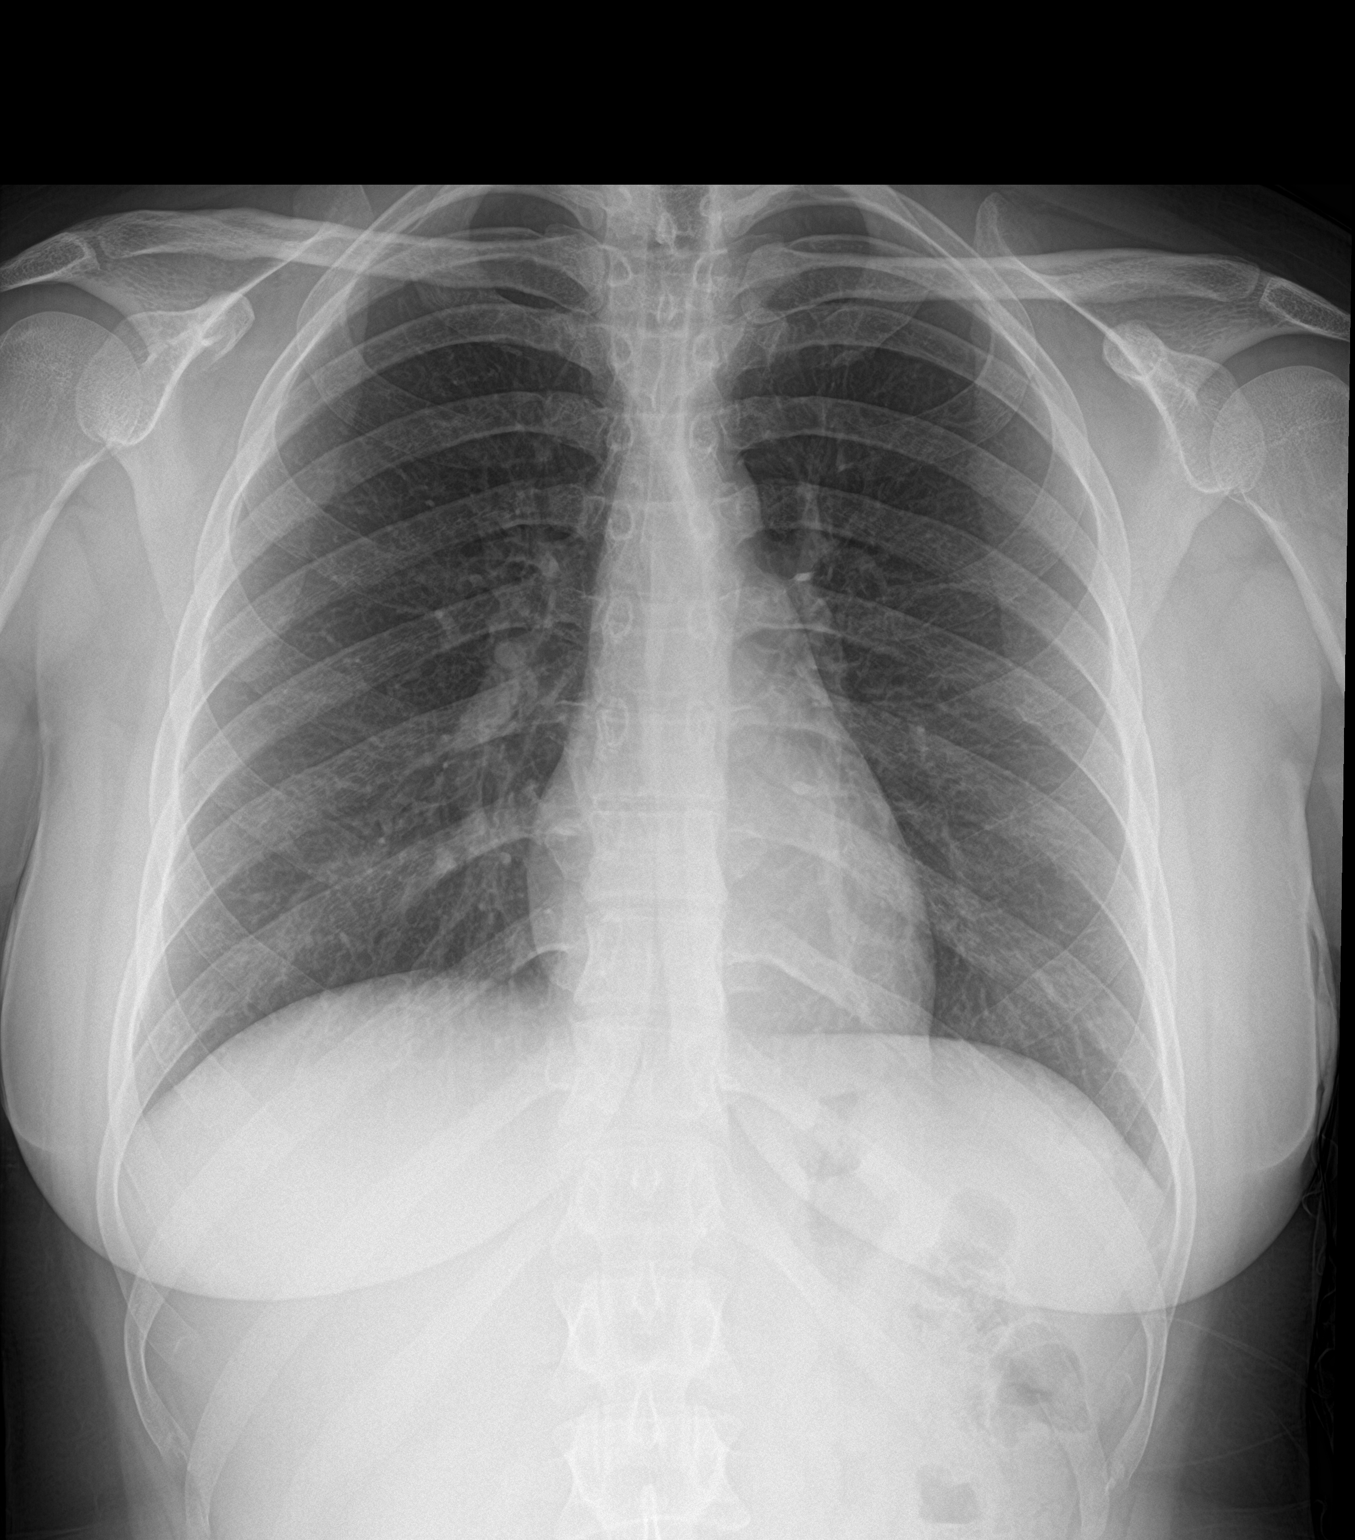

[chest lat]
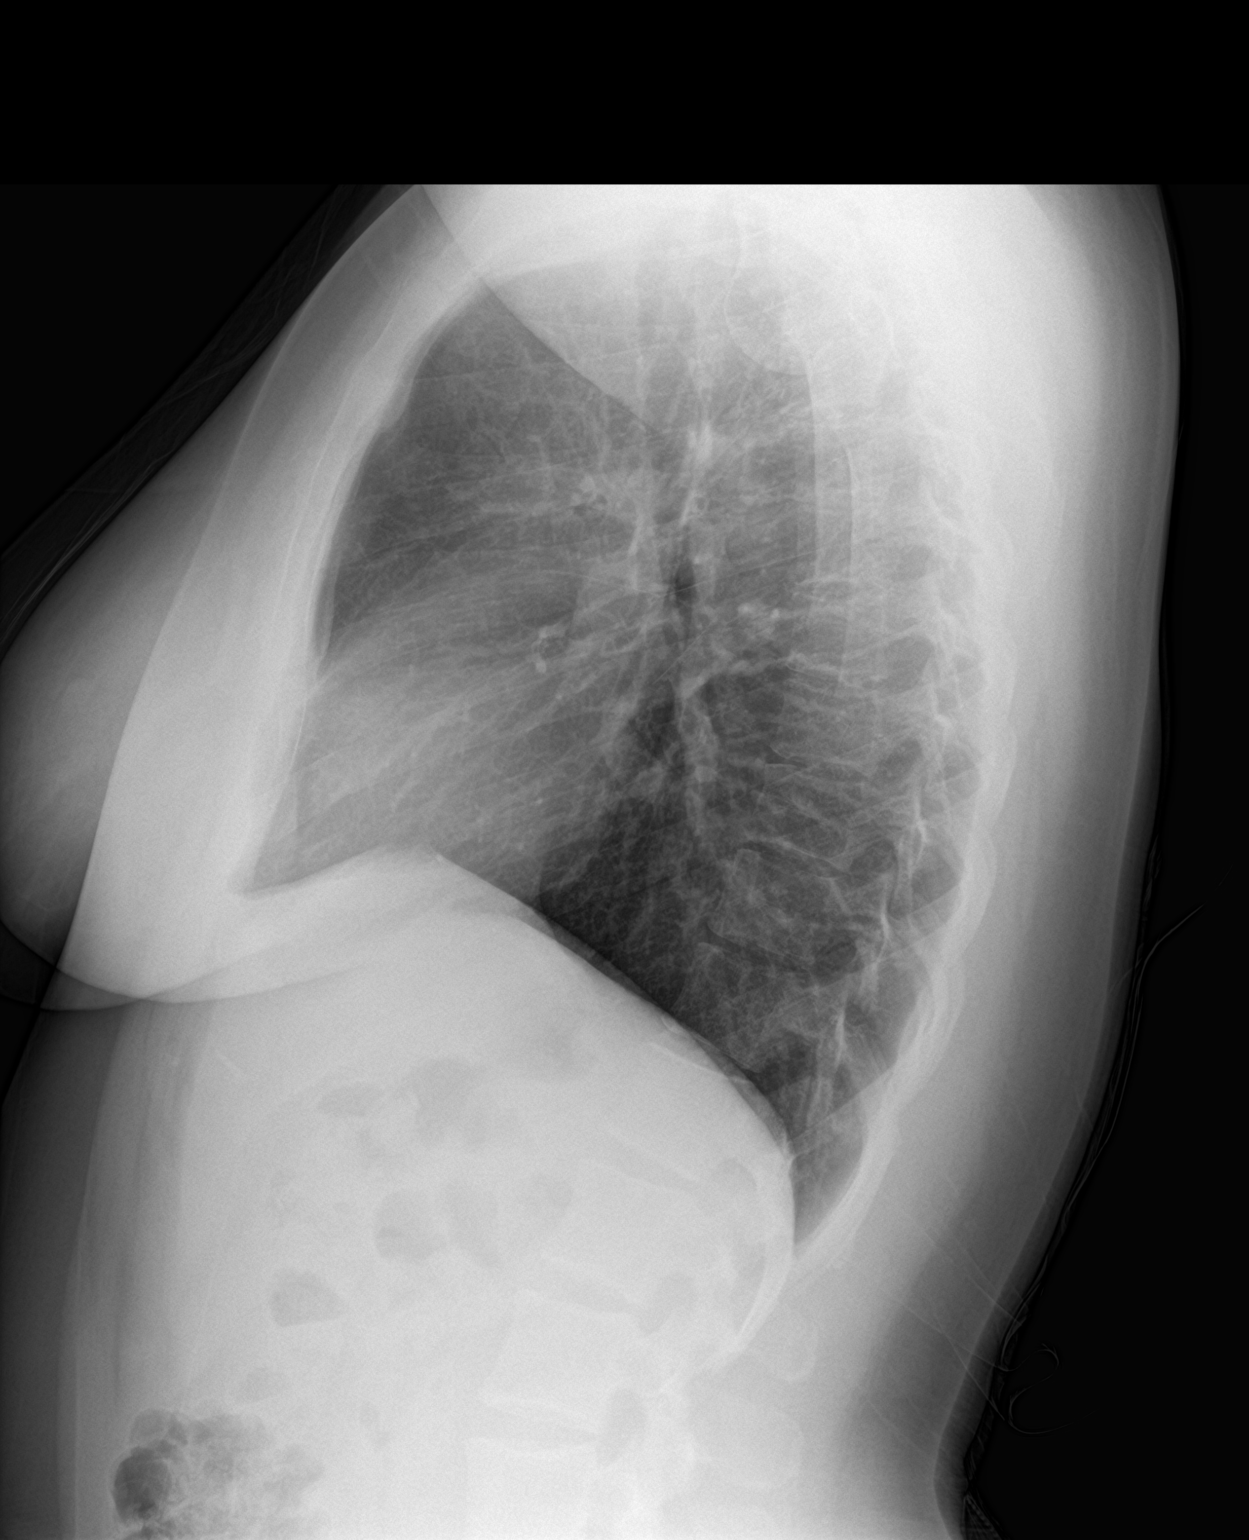

[2 of 2 positions shown; findings below may reference images not displayed]

FINDINGS: The heart size and mediastinal contours are within normal limits.
Both lungs are clear. The visualized skeletal structures are
unremarkable.
IMPRESSION: No active cardiopulmonary disease.

## 2022-03-28 ENCOUNTER — Other Ambulatory Visit (HOSPITAL_BASED_OUTPATIENT_CLINIC_OR_DEPARTMENT_OTHER): Payer: Self-pay

## 2022-05-30 ENCOUNTER — Telehealth: Payer: No Typology Code available for payment source | Admitting: Nurse Practitioner

## 2022-06-26 ENCOUNTER — Ambulatory Visit: Payer: No Typology Code available for payment source | Admitting: Nurse Practitioner

## 2022-07-10 ENCOUNTER — Other Ambulatory Visit (HOSPITAL_COMMUNITY): Payer: Self-pay

## 2022-07-11 ENCOUNTER — Ambulatory Visit: Payer: No Typology Code available for payment source | Admitting: Nurse Practitioner

## 2022-07-18 ENCOUNTER — Other Ambulatory Visit (HOSPITAL_COMMUNITY): Payer: Self-pay

## 2022-07-30 ENCOUNTER — Encounter: Payer: Self-pay | Admitting: Nurse Practitioner

## 2022-07-30 ENCOUNTER — Other Ambulatory Visit (HOSPITAL_COMMUNITY): Payer: Self-pay

## 2022-07-30 ENCOUNTER — Ambulatory Visit (INDEPENDENT_AMBULATORY_CARE_PROVIDER_SITE_OTHER): Payer: No Typology Code available for payment source | Admitting: Nurse Practitioner

## 2022-07-30 VITALS — BP 120/78 | HR 69 | Temp 97.5°F | Ht 62.0 in | Wt 192.6 lb

## 2022-07-30 DIAGNOSIS — G43709 Chronic migraine without aura, not intractable, without status migrainosus: Secondary | ICD-10-CM

## 2022-07-30 DIAGNOSIS — L658 Other specified nonscarring hair loss: Secondary | ICD-10-CM | POA: Insufficient documentation

## 2022-07-30 DIAGNOSIS — E6609 Other obesity due to excess calories: Secondary | ICD-10-CM | POA: Insufficient documentation

## 2022-07-30 DIAGNOSIS — Z6835 Body mass index (BMI) 35.0-35.9, adult: Secondary | ICD-10-CM | POA: Diagnosis not present

## 2022-07-30 DIAGNOSIS — E669 Obesity, unspecified: Secondary | ICD-10-CM | POA: Insufficient documentation

## 2022-07-30 MED ORDER — KETOROLAC TROMETHAMINE 30 MG/ML IJ SOLN
30.0000 mg | Freq: Once | INTRAMUSCULAR | Status: AC
  Administered 2022-07-30: 30 mg via INTRAMUSCULAR

## 2022-07-30 MED ORDER — RIZATRIPTAN BENZOATE 5 MG PO TABS
5.0000 mg | ORAL_TABLET | ORAL | 0 refills | Status: DC | PRN
  Filled 2022-07-30: qty 10, 30d supply, fill #0
  Filled 2022-08-12: qty 8, 24d supply, fill #0
  Filled 2022-08-12: qty 10, 30d supply, fill #0
  Filled 2022-08-12: qty 2, 6d supply, fill #0

## 2022-07-30 NOTE — Progress Notes (Signed)
Established Patient Visit  Patient: Jessica Horton   DOB: Apr 26, 1991   31 y.o. Female  MRN: [id number] Visit Date: 07/30/2022  Subjective:    Chief Complaint  Patient presents with   Acute Visit    C/o of nausea & migraines Weight loss options  No other concerns     HPI Migraines Onset at age 89, worse in last 55months (migraine daily) Description: left side to occipital region, associated with nausea, light and sound sensitivity, intermittent dizziness and tinnitus Denies any change in vision or syncope or seizure or hearing loss. Head injury at age 47: hit head on concrete floor. Some improvement with fioricet, excedrin, tylenol and ibuprofen. She is concerned about being dependent of meds with caffeine. Last eye exam within 30yr: normal per patient.  Agreed to use maxalt prn, riboflavin, magnesium and Co Q10. Avoiding TCA and topamax because she is trying to get pregnant. F/up in 87month  Class 2 obesity due to excess calories without serious comorbidity with body mass index (BMI) of 35.0 to 35.9 in adult Difficulty with weight loss despite daily caloric deficit <1700kcal and exercise (walking daily). 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  Reviewed medical, surgical, and social history today  Medications: Outpatient Medications Prior to Visit  Medication Sig   albuterol (PROVENTIL) (2.5 MG/3ML) 0.083% nebulizer solution Take 3 mLs (2.5 mg total) by nebulization every 6 (six) hours as needed for wheezing or shortness of breath.   albuterol (PROVENTIL) (2.5 MG/3ML) 0.083% nebulizer solution Take 3 mLs by nebulization every 6 (six) hours as needed for wheezing or shortness of breath   albuterol (VENTOLIN HFA) 108 (90 Base) MCG/ACT inhaler Inhale 1-2 puffs into the lungs  every 6 (six) hours as needed for wheezing or shortness of breath.   ibuprofen (ADVIL) 800 MG tablet Take 1 tablet (800 mg total) by mouth every 8 (eight) hours as needed (pain).   [DISCONTINUED] fluticasone (FLOVENT HFA) 110 MCG/ACT inhaler Inhale 2 puffs into the lungs 2 (two) times daily. (Patient not taking: Reported on 07/30/2022)   [DISCONTINUED] letrozole (FEMARA) 2.5 MG tablet Take 3 tablets (7.5 mg total) by mouth daily. (Patient not taking: Reported on 07/30/2022)   [DISCONTINUED] letrozole (FEMARA) 2.5 MG tablet Take 3 tablets (7.5 mg total) by mouth daily. (Patient not taking: Reported on 07/30/2022)   No facility-administered medications prior to visit.   Reviewed past medical and social history.   ROS per HPI above  Last CBC Lab Results  Component Value Date   WBC 6.8 12/27/2021   HGB 12.8 12/27/2021   HCT 38.8 12/27/2021   MCV 89.6 12/27/2021   MCH 29.6 12/27/2021   RDW 11.8 12/27/2021   PLT 267 12/27/2021   Last metabolic panel Lab Results  Component Value Date   GLUCOSE 91 12/27/2021   NA 137 12/27/2021   K 4.4 12/27/2021   CL 106 12/27/2021   CO2 23 12/27/2021   BUN 13 12/27/2021   CREATININE 0.57 12/27/2021   GFRNONAA >60 07/04/2018   CALCIUM 9.1 12/27/2021   PROT 7.0 12/27/2021   ALBUMIN 3.9 07/04/2018   BILITOT 0.6 12/27/2021   ALKPHOS 65 07/04/2018   AST 13 12/27/2021   ALT 15  12/27/2021   ANIONGAP 12 07/04/2018   Last thyroid functions Lab Results  Component Value Date   TSH 1.68 12/27/2021        Objective:  BP 120/78 (BP Location: Right Arm, Patient Position: Sitting, Cuff Size: Normal)   Pulse 69   Temp (!) 97.5 F (36.4 C) (Temporal)   Ht 5\' 2"  (1.575 m)   Wt 192 lb 9.6 oz (87.4 kg)   LMP 07/14/2022 (Exact Date)   SpO2 98%   BMI 35.23 kg/m      Physical Exam Constitutional:      Appearance: She is obese.  Eyes:     Extraocular Movements: Extraocular movements intact.     Conjunctiva/sclera: Conjunctivae normal.     Pupils:  Pupils are equal, round, and reactive to light.  Cardiovascular:     Rate and Rhythm: Normal rate.     Pulses: Normal pulses.  Pulmonary:     Effort: Pulmonary effort is normal.  Neurological:     Mental Status: She is alert and oriented to person, place, and time.     Cranial Nerves: No cranial nerve deficit.  Psychiatric:        Mood and Affect: Mood normal.        Behavior: Behavior normal.        Thought Content: Thought content normal.     No results found for any visits on 07/30/22.    Assessment & Plan:    Problem List Items Addressed This Visit       Cardiovascular and Mediastinum   Migraines - Primary    Onset at age 15, worse in last 73months (migraine daily) Description: left side to occipital region, associated with nausea, light and sound sensitivity, intermittent dizziness and tinnitus Denies any change in vision or syncope or seizure or hearing loss. Head injury at age 12: hit head on concrete floor. Some improvement with fioricet, excedrin, tylenol and ibuprofen. She is concerned about being dependent of meds with caffeine. Last eye exam within 24yr: normal per patient.  Agreed to use maxalt prn, riboflavin, magnesium and Co Q10. Avoiding TCA and topamax because she is trying to get pregnant. 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      Relevant Orders   Ambulatory referral to Dermatology     Other   Class 2 obesity due to excess calories without serious comorbidity with body mass index (BMI) of 35.0 to 35.9 in adult    Difficulty with weight loss despite daily caloric deficit <1700kcal and exercise (walking 0yr daily). Wt Readings from Last 3 Encounters:  07/30/22 192 lb 9.6 oz (87.4 kg)  12/19/21 185 lb (83.9 kg)  06/11/21 176 lb 11.2 oz (80.2 kg)   Agreed to weight management clinic referral      Relevant Orders   Amb Ref to  Medical Weight Management   Return in about 4 weeks (around 08/27/2022) for migraine.     08/29/2022, NP

## 2022-07-30 NOTE — Assessment & Plan Note (Addendum)
Onset at age 31, worse in last 87months (migraine daily) Description: left side to occipital region, associated with nausea, light and sound sensitivity, intermittent dizziness and tinnitus Denies any change in vision or syncope or seizure or hearing loss. Head injury at age 28: hit head on concrete floor. Some improvement with fioricet, excedrin, tylenol and ibuprofen. She is concerned about being dependent of meds with caffeine. Last eye exam within 22yr: normal per patient.  Agreed to use maxalt prn, riboflavin, magnesium and Co Q10. Avoiding TCA and topamax because she is trying to get pregnant. F/up in 59month

## 2022-07-30 NOTE — Patient Instructions (Signed)
To help reduce HEADACHES: Coenzyme Q10 160mg  ONCE DAILY Riboflavin/Vitamin B2 400mg  ONCE DAILY Magnesium oxide 400mg  ONCE - TWICE DAILY  Use maxalt as directed  You will be contacted to schedule appt with nutritionist and dermatology.

## 2022-07-30 NOTE — Assessment & Plan Note (Signed)
Difficulty with weight loss despite daily caloric deficit <1700kcal and exercise (walking daily). Wt Readings from Last 3 Encounters:  07/30/22 192 lb 9.6 oz (87.4 kg)  12/19/21 185 lb (83.9 kg)  06/11/21 176 lb 11.2 oz (80.2 kg)   Agreed to weight management clinic referral

## 2022-07-30 NOTE — Assessment & Plan Note (Signed)
Onset 31yrs ago.

## 2022-08-07 ENCOUNTER — Other Ambulatory Visit (HOSPITAL_COMMUNITY): Payer: Self-pay

## 2022-08-08 ENCOUNTER — Other Ambulatory Visit (HOSPITAL_COMMUNITY): Payer: Self-pay

## 2022-08-10 ENCOUNTER — Encounter (HOSPITAL_COMMUNITY): Payer: Self-pay

## 2022-08-10 ENCOUNTER — Other Ambulatory Visit: Payer: Self-pay

## 2022-08-10 ENCOUNTER — Emergency Department (HOSPITAL_COMMUNITY)
Admission: EM | Admit: 2022-08-10 | Discharge: 2022-08-11 | Disposition: A | Discharge status: Home / Self Care | Payer: No Typology Code available for payment source | Attending: Emergency Medicine | Admitting: Emergency Medicine

## 2022-08-10 DIAGNOSIS — T782XXA Anaphylactic shock, unspecified, initial encounter: Secondary | ICD-10-CM | POA: Diagnosis not present

## 2022-08-10 DIAGNOSIS — Z7951 Long term (current) use of inhaled steroids: Secondary | ICD-10-CM | POA: Insufficient documentation

## 2022-08-10 DIAGNOSIS — J45909 Unspecified asthma, uncomplicated: Secondary | ICD-10-CM | POA: Insufficient documentation

## 2022-08-10 DIAGNOSIS — L509 Urticaria, unspecified: Secondary | ICD-10-CM | POA: Diagnosis present

## 2022-08-10 DIAGNOSIS — R059 Cough, unspecified: Secondary | ICD-10-CM | POA: Diagnosis not present

## 2022-08-10 DIAGNOSIS — R0789 Other chest pain: Secondary | ICD-10-CM | POA: Insufficient documentation

## 2022-08-10 MED ORDER — EPINEPHRINE 0.3 MG/0.3ML IJ SOAJ
0.3000 mg | Freq: Once | INTRAMUSCULAR | Status: AC
Start: 2022-08-10 — End: 2022-08-10
  Administered 2022-08-10: 0.3 mg via INTRAMUSCULAR
  Filled 2022-08-10: qty 0.3

## 2022-08-10 MED ORDER — FAMOTIDINE IN NACL 20-0.9 MG/50ML-% IV SOLN
20.0000 mg | Freq: Once | INTRAVENOUS | Status: AC
  Administered 2022-08-10: 20 mg via INTRAVENOUS
  Filled 2022-08-10: qty 50

## 2022-08-10 MED ORDER — DIPHENHYDRAMINE HCL 50 MG/ML IJ SOLN
25.0000 mg | Freq: Once | INTRAMUSCULAR | Status: AC
Start: 2022-08-10 — End: 2022-08-10
  Administered 2022-08-10: 25 mg via INTRAVENOUS
  Filled 2022-08-10: qty 1

## 2022-08-10 MED ORDER — ALBUTEROL SULFATE (2.5 MG/3ML) 0.083% IN NEBU
5.0000 mg | INHALATION_SOLUTION | Freq: Once | RESPIRATORY_TRACT | Status: AC
Start: 2022-08-10 — End: 2022-08-10
  Administered 2022-08-10: 5 mg via RESPIRATORY_TRACT
  Filled 2022-08-10: qty 6

## 2022-08-10 MED ORDER — METHYLPREDNISOLONE SODIUM SUCC 125 MG IJ SOLR
125.0000 mg | Freq: Once | INTRAMUSCULAR | Status: AC
Start: 2022-08-10 — End: 2022-08-10
  Administered 2022-08-10: 125 mg via INTRAVENOUS
  Filled 2022-08-10: qty 2

## 2022-08-10 NOTE — ED Triage Notes (Signed)
Stung by wasp earlier today on right foot.   Generalized hives.   Tongue tingling and swelling sensation.

## 2022-08-10 NOTE — ED Provider Notes (Signed)
   [facility]   CSN: [id number] Arrival date & time: 08/10/22  2255     History {Add pertinent medical, surgical, social history, OB history to HPI:1} Chief Complaint  Patient presents with   Bee Sting - Hives    Jessica Horton is a 31 y.o. female.  The history is provided by the patient.  Jessica Horton is a 31 y.o. female who presents to the Emergency Department complaining of *** Benadryl, inhaler - hives went away and came back around 930.   Stung at 2pm.   Cough since sting, chest tightness    Home Medications Prior to Admission medications   Medication Sig Start Date End Date Taking? Authorizing Provider  albuterol (PROVENTIL) (2.5 MG/3ML) 0.083% nebulizer solution Take 3 mLs (2.5 mg total) by nebulization every 6 (six) hours as needed for wheezing or shortness of breath. 08/02/21   Gustavus Bryant, FNP  albuterol (PROVENTIL) (2.5 MG/3ML) 0.083% nebulizer solution Take 3 mLs by nebulization every 6 (six) hours as needed for wheezing or shortness of breath 08/02/21   Gustavus Bryant, FNP  albuterol (VENTOLIN HFA) 108 (90 Base) MCG/ACT inhaler Inhale 1-2 puffs into the lungs every 6 (six) hours as needed for wheezing or shortness of breath. 08/02/21   Gustavus Bryant, FNP  ibuprofen (ADVIL) 800 MG tablet Take 1 tablet (800 mg total) by mouth every 8 (eight) hours as needed (pain). 03/10/22   Zenia Resides, MD  rizatriptan (MAXALT) 5 MG tablet Take 1 tablet (5 mg total) by mouth as needed for migraine. May repeat in 2 hours if needed 07/30/22   Nche, Bonna Gains, NP      Allergies    Patient has no known allergies.    Review of Systems   Review of Systems  Physical Exam Updated Vital Signs BP (!) 132/104   Pulse 98   Temp 98.3 F (36.8 C) (Oral)   Resp 19   Ht 5\' 2"  (1.575 m)   Wt 86.2 kg   LMP 08/10/2022 (Exact Date)   SpO2 100%   BMI 34.75 kg/m  Physical Exam  ED Results / Procedures / Treatments    Labs (all labs ordered are listed, but only abnormal results are displayed) Labs Reviewed - No data to display  EKG None  Radiology No results found.  Procedures Procedures  {Document cardiac monitor, telemetry assessment procedure when appropriate:1}  Medications Ordered in ED Medications - No data to display  ED Course/ Medical Decision Making/ A&P                           Medical Decision Making  ***  {Document critical care time when appropriate:1} {Document review of labs and clinical decision tools ie heart score, Chads2Vasc2 etc:1}  {Document your independent review of radiology images, and any outside records:1} {Document your discussion with family members, caretakers, and with consultants:1} {Document social determinants of health affecting pt's care:1} {Document your decision making why or why not admission, treatments were needed:1} Final Clinical Impression(s) / ED Diagnoses Final diagnoses:  None    Rx / DC Orders ED Discharge Orders     None

## 2022-08-11 MED ORDER — EPINEPHRINE 0.3 MG/0.3ML IJ SOAJ
0.3000 mg | INTRAMUSCULAR | 1 refills | Status: DC | PRN
  Filled 2022-08-11: qty 2, 2d supply, fill #0

## 2022-08-12 ENCOUNTER — Other Ambulatory Visit (HOSPITAL_COMMUNITY): Payer: Self-pay

## 2022-08-27 ENCOUNTER — Encounter: Payer: Self-pay | Admitting: Nurse Practitioner

## 2022-08-27 ENCOUNTER — Telehealth (INDEPENDENT_AMBULATORY_CARE_PROVIDER_SITE_OTHER): Payer: No Typology Code available for payment source | Admitting: Nurse Practitioner

## 2022-08-27 ENCOUNTER — Other Ambulatory Visit (HOSPITAL_COMMUNITY): Payer: Self-pay

## 2022-08-27 VITALS — Ht 62.0 in | Wt 190.0 lb

## 2022-08-27 DIAGNOSIS — G43709 Chronic migraine without aura, not intractable, without status migrainosus: Secondary | ICD-10-CM | POA: Diagnosis not present

## 2022-08-27 MED ORDER — RIZATRIPTAN BENZOATE 10 MG PO TABS
10.0000 mg | ORAL_TABLET | ORAL | 0 refills | Status: DC | PRN
  Filled 2022-08-27 – 2023-07-24 (×2): qty 10, 30d supply, fill #0

## 2022-08-27 NOTE — Progress Notes (Signed)
Virtual Visit via Video Note  I connected withNAME@ on 08/27/22 at  9:20 AM EDT by a video enabled telemedicine application and verified that I am speaking with the correct person using two identifiers.  Location: Patient:Home Provider: Office Participants: patient and provider  I discussed the limitations of evaluation and management by telemedicine and the availability of in person appointments. I also discussed with the patient that there may be a patient responsible charge related to this service. The patient expressed understanding and agreed to proceed.  GP:[id number] f/up  History of Present Illness:  Migraines episodes in last 3weeks, resolved with 1tab of maxalt each. 3episodes of headache, resolved with tylenol or ibuprofen. Denies any new symptom.  Maintain current regimen Avoid propanolol due to risk of hypotension and bradycardia.   BP Readings from Last 3 Encounters:  08/11/22 106/71  07/30/22 120/78  03/10/22 115/76    Observations/Objective: Alert and oriented  Assessment and Plan: Jessica Horton was seen today for office visit.  Diagnoses and all orders for this visit:  Chronic migraine without aura without status migrainosus, not intractable -     rizatriptan (MAXALT) 10 MG tablet; Take 1 tablet (10 mg total) by mouth as needed for migraine. May repeat in 2 hours if needed. No more than 30mg  in 24hrs   Follow Up Instructions: See instructions above   I discussed the assessment and treatment plan with the patient. The patient was provided an opportunity to ask questions and all were answered. The patient agreed with the plan and demonstrated an understanding of the instructions.   The patient was advised to call back or seek an in-person evaluation if the symptoms worsen or if the condition fails to improve as anticipated.  , NP

## 2022-08-27 NOTE — Assessment & Plan Note (Signed)
 episodes in last 3weeks, resolved with 1tab of maxalt each. 3episodes of headache, resolved with tylenol or ibuprofen. Denies any new symptom.  Maintain current regimen Avoid propanolol due to risk of hypotension and bradycardia.

## 2022-09-04 ENCOUNTER — Other Ambulatory Visit (HOSPITAL_COMMUNITY): Payer: Self-pay

## 2022-09-17 ENCOUNTER — Encounter: Payer: Self-pay | Admitting: Nurse Practitioner

## 2022-09-30 ENCOUNTER — Telehealth: Payer: Self-pay | Admitting: Nurse Practitioner

## 2022-09-30 NOTE — Telephone Encounter (Signed)
Caller Name: Nickolette Espinola [facility] Call back phone #: [phone]  Reason for Call: Was given a referral to a dermatologist. [facility] does not take her insurance ([employer]  employee). Can we please transfer this referral to another practice

## 2022-10-28 ENCOUNTER — Other Ambulatory Visit (HOSPITAL_COMMUNITY): Payer: Self-pay

## 2022-10-28 MED ORDER — INFLUENZA VAC SPLIT QUAD 0.5 ML IM SUSY
0.5000 mL | PREFILLED_SYRINGE | INTRAMUSCULAR | 0 refills | Status: DC
  Filled 2022-10-28: qty 0.5, 1d supply, fill #0

## 2022-11-20 ENCOUNTER — Ambulatory Visit: Payer: No Typology Code available for payment source | Admitting: Obstetrics & Gynecology

## 2022-12-02 ENCOUNTER — Other Ambulatory Visit (HOSPITAL_COMMUNITY): Payer: Self-pay

## 2022-12-02 MED ORDER — CHORIOGONADOTROPIN ALFA 250 MCG/0.5ML ~~LOC~~ INJ
INJECTION | SUBCUTANEOUS | 3 refills | Status: DC
  Filled 2022-12-02: qty 0.5, 1d supply, fill #0
  Filled 2022-12-27: qty 0.5, 1d supply, fill #1
  Filled 2023-01-27: qty 0.5, 1d supply, fill #2

## 2022-12-02 MED ORDER — LETROZOLE 2.5 MG PO TABS
7.5000 mg | ORAL_TABLET | Freq: Every day | ORAL | 3 refills | Status: DC
  Filled 2022-12-02: qty 15, 5d supply, fill #0
  Filled 2022-12-27: qty 15, 5d supply, fill #1

## 2022-12-09 ENCOUNTER — Other Ambulatory Visit (HOSPITAL_COMMUNITY): Payer: Self-pay

## 2022-12-09 MED ORDER — VITAMIN D3 1.25 MG (50000 UT) PO CAPS
50000.0000 [IU] | ORAL_CAPSULE | ORAL | 0 refills | Status: DC
  Filled 2022-12-09: qty 12, 84d supply, fill #0

## 2022-12-13 ENCOUNTER — Encounter: Payer: Self-pay | Admitting: Nurse Practitioner

## 2022-12-13 ENCOUNTER — Encounter: Payer: No Typology Code available for payment source | Admitting: Nurse Practitioner

## 2022-12-13 ENCOUNTER — Telehealth: Payer: Self-pay | Admitting: Nurse Practitioner

## 2022-12-13 NOTE — Telephone Encounter (Signed)
2nd no show, fee generated, final warning sent via mail and mychart 

## 2022-12-13 NOTE — Telephone Encounter (Signed)
Pt was  a no show 12/15 for a cpe with Nche. This is her second

## 2022-12-20 ENCOUNTER — Encounter: Payer: No Typology Code available for payment source | Admitting: Nurse Practitioner

## 2022-12-27 ENCOUNTER — Other Ambulatory Visit (HOSPITAL_COMMUNITY): Payer: Self-pay

## 2023-01-01 ENCOUNTER — Telehealth: Payer: 59 | Admitting: Nurse Practitioner

## 2023-01-01 ENCOUNTER — Other Ambulatory Visit (HOSPITAL_COMMUNITY): Payer: Self-pay

## 2023-01-01 DIAGNOSIS — J020 Streptococcal pharyngitis: Secondary | ICD-10-CM

## 2023-01-01 MED ORDER — CEPHALEXIN 500 MG PO CAPS
500.0000 mg | ORAL_CAPSULE | Freq: Two times a day (BID) | ORAL | 0 refills | Status: AC
  Filled 2023-01-01: qty 20, 10d supply, fill #0

## 2023-01-01 NOTE — Progress Notes (Signed)
E-Visit for Sore Throat - Strep Symptoms  We are sorry that you are not feeling well.  Here is how we plan to help!  Based on what you have shared with me it is likely that you have strep pharyngitis.  Strep pharyngitis is inflammation and infection in the back of the throat.  This is an infection cause by bacteria and is treated with antibiotics.  I have prescribed Cephalexin 500 mg twice a day for 10 days. For throat pain, we recommend over the counter oral pain relief medications such as acetaminophen or aspirin, or anti-inflammatory medications such as ibuprofen or naproxen sodium. Topical treatments such as oral throat lozenges or sprays may be used as needed. Strep infections are not as easily transmitted as other respiratory infections, however we still recommend that you avoid close contact with loved ones, especially the very young and elderly.  Remember to wash your hands thoroughly throughout the day as this is the number one way to prevent the spread of infection and wipe down door knobs and counters with disinfectant.   Home Care: Only take medications as instructed by your medical team. Complete the entire course of an antibiotic. Do not take these medications with alcohol. A steam or ultrasonic humidifier can help congestion.  You can place a towel over your head and breathe in the steam from hot water coming from a faucet. Avoid close contacts especially the very young and the elderly. Cover your mouth when you cough or sneeze. Always remember to wash your hands.  Get Help Right Away If: You develop worsening fever or sinus pain. You develop a severe head ache or visual changes. Your symptoms persist after you have completed your treatment plan.  Make sure you Understand these instructions. Will watch your condition. Will get help right away if you are not doing well or get worse.   Thank you for choosing an e-visit.  Your e-visit answers were reviewed by a board  certified advanced clinical practitioner to complete your personal care plan. Depending upon the condition, your plan could have included both over the counter or prescription medications.  Please review your pharmacy choice. Make sure the pharmacy is open so you can pick up prescription now. If there is a problem, you may contact your provider through [employer] and have the prescription routed to another pharmacy.  Your safety is important to [place]. If you have drug allergies check your prescription carefully.   For the next 24 hours you can use MyChart to ask questions about today's visit, request a non-urgent call back, or ask for a work or school excuse. You will get an email in the next two days asking about your experience. I hope that your e-visit has been valuable and will speed your recovery.   Meds ordered this encounter  Medications   cephALEXin (KEFLEX) 500 MG capsule    Sig: Take 1 capsule (500 mg total) by mouth 2 (two) times daily for 10 days.    Dispense:  20 capsule    Refill:  0     I spent approximately 5 minutes reviewing the patient's history, current symptoms and coordinating their care today.

## 2023-01-22 ENCOUNTER — Other Ambulatory Visit (HOSPITAL_COMMUNITY): Payer: Self-pay

## 2023-01-22 DIAGNOSIS — Z319 Encounter for procreative management, unspecified: Secondary | ICD-10-CM | POA: Diagnosis not present

## 2023-01-22 DIAGNOSIS — Z6832 Body mass index (BMI) 32.0-32.9, adult: Secondary | ICD-10-CM | POA: Diagnosis not present

## 2023-01-22 DIAGNOSIS — Z3009 Encounter for other general counseling and advice on contraception: Secondary | ICD-10-CM | POA: Diagnosis not present

## 2023-01-22 MED ORDER — LETROZOLE 2.5 MG PO TABS
7.5000 mg | ORAL_TABLET | Freq: Every day | ORAL | 0 refills | Status: DC
  Filled 2023-01-22: qty 15, 5d supply, fill #0

## 2023-01-27 ENCOUNTER — Other Ambulatory Visit (HOSPITAL_COMMUNITY): Payer: Self-pay

## 2023-01-27 MED ORDER — OVIDREL 250 MCG/0.5ML ~~LOC~~ INJ
0.5000 mL | INJECTION | SUBCUTANEOUS | 5 refills | Status: DC
  Filled 2023-01-27: qty 0.5, 30d supply, fill #0
  Filled 2023-02-19: qty 0.5, 1d supply, fill #0

## 2023-01-28 ENCOUNTER — Other Ambulatory Visit (HOSPITAL_COMMUNITY): Payer: Self-pay

## 2023-02-04 DIAGNOSIS — Z319 Encounter for procreative management, unspecified: Secondary | ICD-10-CM | POA: Diagnosis not present

## 2023-02-04 DIAGNOSIS — N979 Female infertility, unspecified: Secondary | ICD-10-CM | POA: Diagnosis not present

## 2023-02-18 ENCOUNTER — Other Ambulatory Visit (HOSPITAL_COMMUNITY): Payer: Self-pay

## 2023-02-18 MED ORDER — LETROZOLE 2.5 MG PO TABS
7.5000 mg | ORAL_TABLET | Freq: Every day | ORAL | 0 refills | Status: DC
  Filled 2023-02-18: qty 15, 5d supply, fill #0

## 2023-02-19 ENCOUNTER — Other Ambulatory Visit (HOSPITAL_COMMUNITY): Payer: Self-pay

## 2023-02-21 ENCOUNTER — Other Ambulatory Visit (HOSPITAL_COMMUNITY): Payer: Self-pay

## 2023-02-26 DIAGNOSIS — Z319 Encounter for procreative management, unspecified: Secondary | ICD-10-CM | POA: Diagnosis not present

## 2023-02-26 DIAGNOSIS — N979 Female infertility, unspecified: Secondary | ICD-10-CM | POA: Diagnosis not present

## 2023-02-28 DIAGNOSIS — N979 Female infertility, unspecified: Secondary | ICD-10-CM | POA: Diagnosis not present

## 2023-02-28 DIAGNOSIS — Z319 Encounter for procreative management, unspecified: Secondary | ICD-10-CM | POA: Diagnosis not present

## 2023-02-28 DIAGNOSIS — Z3183 Encounter for assisted reproductive fertility procedure cycle: Secondary | ICD-10-CM | POA: Diagnosis not present

## 2023-04-11 ENCOUNTER — Telehealth: Payer: 59 | Admitting: Emergency Medicine

## 2023-04-11 ENCOUNTER — Other Ambulatory Visit (HOSPITAL_COMMUNITY): Payer: Self-pay

## 2023-04-11 DIAGNOSIS — N898 Other specified noninflammatory disorders of vagina: Secondary | ICD-10-CM | POA: Diagnosis not present

## 2023-04-11 DIAGNOSIS — R350 Frequency of micturition: Secondary | ICD-10-CM

## 2023-04-11 MED ORDER — FLUCONAZOLE 150 MG PO TABS
150.0000 mg | ORAL_TABLET | Freq: Once | ORAL | 0 refills | Status: AC
  Filled 2023-04-11: qty 1, 1d supply, fill #0

## 2023-04-11 MED ORDER — CEPHALEXIN 500 MG PO CAPS
500.0000 mg | ORAL_CAPSULE | Freq: Two times a day (BID) | ORAL | 0 refills | Status: DC
  Filled 2023-04-11: qty 14, 7d supply, fill #0

## 2023-04-11 NOTE — Patient Instructions (Signed)
Ashiyah Maggie Schwalbe El [name], thank you for joining Roxy Horseman, PA-C for today's virtual visit.  While this provider is not your primary care provider (PCP), if your PCP is located in our provider database this encounter information will be shared with them immediately following your visit.   A [facility] MyChart account gives you access to today's visit and all your visits, tests, and labs performed at [facility] " click here if you don't have a [facility] MyChart account or go to [email or web address]  Consent: (Patient) Jessica Horton provided verbal consent for this virtual visit at the beginning of the encounter.  Current Medications:  Current Outpatient Medications:    cephALEXin (KEFLEX) 500 MG capsule, Take 1 capsule (500 mg total) by mouth 2 (two) times daily., Disp: 14 capsule, Rfl: 0   fluconazole (DIFLUCAN) 150 MG tablet, Take 1 tablet (150 mg total) by mouth once for 1 dose., Disp: 1 tablet, Rfl: 0   albuterol (PROVENTIL) (2.5 MG/3ML) 0.083% nebulizer solution, Take 3 mLs (2.5 mg total) by nebulization every 6 (six) hours as needed for wheezing or shortness of breath., Disp: 75 mL, Rfl: 12   albuterol (VENTOLIN HFA) 108 (90 Base) MCG/ACT inhaler, Inhale 1-2 puffs into the lungs every 6 (six) hours as needed for wheezing or shortness of breath., Disp: 1 each, Rfl: 0   Cholecalciferol (VITAMIN D3) 1.25 MG (50000 UT) CAPS, Take 50,000 Units by mouth once a week., Disp: 12 capsule, Rfl: 0   Choriogonadotropin Alfa (OVIDREL) 250 MCG/0.5ML injection, Inject 0.5 mLs (0.25 mg total) into the skin every month as directed, Disp: 0.5 mL, Rfl: 5   Choriogonadotropin Alfa 250 MCG/0.5ML injection, inject 1 prefilled syringe at exact time indicated as trigger, Disp: 0.5 mL, Rfl: 3   EPINEPHrine 0.3 mg/0.3 mL IJ SOAJ injection, Inject 0.3 mg into the muscle as needed for anaphylaxis., Disp: 2 each, Rfl: 1   ibuprofen (ADVIL) 800 MG tablet, Take 1 tablet (800 mg total) by  mouth every 8 (eight) hours as needed (pain)., Disp: 21 tablet, Rfl: 0   influenza vac split quadrivalent PF (FLUARIX) 0.5 ML injection, Inject 0.5 mLs into the muscle., Disp: 0.5 mL, Rfl: 0   letrozole (FEMARA) 2.5 MG tablet, Take 3 tablets (7.5 mg total) by mouth daily on days 3 through 7, Disp: 15 tablet, Rfl: 3   letrozole (FEMARA) 2.5 MG tablet, Take 3 tablets (7.5 mg total) by mouth daily for 5 days., Disp: 15 tablet, Rfl: 0   letrozole (FEMARA) 2.5 MG tablet, Take 3 tablets (7.5 mg total) by mouth daily for 5 days., Disp: 15 tablet, Rfl: 0   rizatriptan (MAXALT) 10 MG tablet, Take 1 tablet (10 mg total) by mouth as needed for migraine. May repeat in 2 hours if needed. No more than  in 24hrs, Disp: 10 tablet, Rfl: 0   Medications ordered in this encounter:  Meds ordered this encounter  Medications   cephALEXin (KEFLEX) 500 MG capsule    Sig: Take 1 capsule (500 mg total) by mouth 2 (two) times daily.    Dispense:  14 capsule    Refill:  0    Order Specific Question:   Supervising Provider    Answer:   Merrilee Jansky [[id number]]   fluconazole (DIFLUCAN) 150 MG tablet    Sig: Take 1 tablet (150 mg total) by mouth once for 1 dose.    Dispense:  1 tablet    Refill:  0    Order Specific Question:  Supervising Provider    Answer:   Merrilee Jansky [[id number]]     *If you need refills on other medications prior to your next appointment, please contact your pharmacy*  Follow-Up: Call back or seek an in-person evaluation if the symptoms worsen or if the condition fails to improve as anticipated.  [facility] [phone]  Other Instructions    If you have been instructed to have an in-person evaluation today at a local Urgent Care facility, please use the link below. It will take you to a list of all of our available [facility], including address, phone number and hours of operation. Please do not delay care.  [facility]  If you or a  family member do not have a primary care provider, use the link below to schedule a visit and establish care. When you choose a [facility] primary care physician or advanced practice provider, you gain a long-term partner in health. Find a Primary Care Provider  Learn more about [facility] in-office and virtual care options: [facility] - Get Care Now

## 2023-04-11 NOTE — Progress Notes (Signed)
Virtual Visit Consent   Jessica Horton, you are scheduled for a virtual visit with a [facility] provider today. Just as with appointments in the office, your consent must be obtained to participate. Your consent will be active for this visit and any virtual visit you may have with one of our providers in the next 365 days. If you have a MyChart account, a copy of this consent can be sent to you electronically.  As this is a virtual visit, video technology does not allow for your provider to perform a traditional examination. This may limit your provider's ability to fully assess your condition. If your provider identifies any concerns that need to be evaluated in person or the need to arrange testing (such as labs, EKG, etc.), we will make arrangements to do so. Although advances in technology are sophisticated, we cannot ensure that it will always work on either your end or our end. If the connection with a video visit is poor, the visit may have to be switched to a telephone visit. With either a video or telephone visit, we are not always able to ensure that we have a secure connection.  By engaging in this virtual visit, you consent to the provision of healthcare and authorize for your insurance to be billed (if applicable) for the services provided during this visit. Depending on your insurance coverage, you may receive a charge related to this service.  I need to obtain your verbal consent now. Are you willing to proceed with your visit today? Jessica Horton has provided verbal consent on 04/11/2023 for a virtual visit (video or telephone). Roxy Horseman, PA-C  Date: 04/11/2023 10:34 AM  Virtual Visit via Video Note   I, Roxy Horseman, connected with  Jessica Horton  ([id number], 1991/03/04) on 04/11/23 at 10:30 AM EDT by a video-enabled telemedicine application and verified that I am speaking with the correct person using two identifiers.  Location: Patient: Virtual  Visit Location Patient: Home Provider: Virtual Visit Location Provider: Home Office   I discussed the limitations of evaluation and management by telemedicine and the availability of in person appointments. The patient expressed understanding and agreed to proceed.    History of Present Illness: Jessica Horton is a 32 y.o. who identifies as a female who was assigned female at birth, and is being seen today for vaginal itching.  Denies any urinary burning.  She does report urinary frequency.  Denies urgency.  Denies fever, chills, nausea, vomiting.  Denies pregnancy or breastfeeding.  Denies vaginal discharge.  Denies any flank pain.  First noticed symptoms this morning.  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   Asthma, chronic 01/22/2014   Migraines 01/22/2014    Allergies:  Allergies  Allergen Reactions   Yellow Jacket Venom Anaphylaxis   Medications:  Current Outpatient Medications:    cephALEXin (KEFLEX) 500 MG capsule, Take 1 capsule (500 mg total) by mouth 2 (two) times daily., Disp: 14 capsule, Rfl: 0   fluconazole (DIFLUCAN) 150 MG tablet, Take 1 tablet (150 mg total) by mouth once for 1 dose., Disp: 1 tablet, Rfl: 0   albuterol (PROVENTIL) (2.5 MG/3ML) 0.083% nebulizer solution, Take 3 mLs (2.5 mg total) by nebulization every 6 (six) hours as needed  for wheezing or shortness of breath., Disp: 75 mL, Rfl: 12   albuterol (VENTOLIN HFA) 108 (90 Base) MCG/ACT inhaler, Inhale 1-2 puffs into the lungs every 6 (six) hours as needed for wheezing or shortness of breath., Disp: 1 each, Rfl: 0   Cholecalciferol (VITAMIN D3) 1.25 MG (50000 UT) CAPS, Take 50,000 Units by mouth once a week., Disp: 12 capsule, Rfl: 0   Choriogonadotropin Alfa (OVIDREL) 250 MCG/0.5ML injection, Inject 0.5  mLs (0.25 mg total) into the skin every month as directed, Disp: 0.5 mL, Rfl: 5   Choriogonadotropin Alfa 250 MCG/0.5ML injection, inject 1 prefilled syringe at exact time indicated as trigger, Disp: 0.5 mL, Rfl: 3   EPINEPHrine 0.3 mg/0.3 mL IJ SOAJ injection, Inject 0.3 mg into the muscle as needed for anaphylaxis., Disp: 2 each, Rfl: 1   ibuprofen (ADVIL) 800 MG tablet, Take 1 tablet (800 mg total) by mouth every 8 (eight) hours as needed (pain)., Disp: 21 tablet, Rfl: 0   influenza vac split quadrivalent PF (FLUARIX) 0.5 ML injection, Inject 0.5 mLs into the muscle., Disp: 0.5 mL, Rfl: 0   letrozole (FEMARA) 2.5 MG tablet, Take 3 tablets (7.5 mg total) by mouth daily on days 3 through 7, Disp: 15 tablet, Rfl: 3   letrozole (FEMARA) 2.5 MG tablet, Take 3 tablets (7.5 mg total) by mouth daily for 5 days., Disp: 15 tablet, Rfl: 0   letrozole (FEMARA) 2.5 MG tablet, Take 3 tablets (7.5 mg total) by mouth daily for 5 days., Disp: 15 tablet, Rfl: 0   rizatriptan (MAXALT) 10 MG tablet, Take 1 tablet (10 mg total) by mouth as needed for migraine. May repeat in 2 hours if needed. No more than 30mg  in 24hrs, Disp: 10 tablet, Rfl: 0  Observations/Objective: Patient didn't turn on video. No audible distress No labored breathing.  Speech is clear and coherent with logical content.  Patient is alert and oriented at baseline.    Assessment and Plan: 1. Urinary frequency  2. Vaginal itching   Meds ordered this encounter  Medications   cephALEXin (KEFLEX) 500 MG capsule    Sig: Take 1 capsule (500 mg total) by mouth 2 (two) times daily.    Dispense:  14 capsule    Refill:  0    Order Specific Question:   Supervising Provider    Answer:   Merrilee Jansky [[id number]]   fluconazole (DIFLUCAN) 150 MG tablet    Sig: Take 1 tablet (150 mg total) by mouth once for 1 dose.    Dispense:  1 tablet    Refill:  0    Order Specific Question:   Supervising Provider    Answer:   Merrilee Jansky [id number]    OTC monistat.  Follow Up Instructions: I discussed the assessment and treatment plan with the patient. The patient was provided an opportunity to ask questions and all were answered. The patient agreed with the plan and demonstrated an understanding of the instructions.  A copy of instructions were sent to the patient via MyChart unless otherwise noted below.     The patient was advised to call back or seek an in-person evaluation if the symptoms worsen or if the condition fails to improve as anticipated.  Time:  I spent 11 minutes with the patient via telehealth technology discussing the above problems/concerns.    Roxy Horseman, PA-C

## 2023-06-19 ENCOUNTER — Telehealth: Payer: 59 | Admitting: Family Medicine

## 2023-06-19 ENCOUNTER — Other Ambulatory Visit (HOSPITAL_COMMUNITY): Payer: Self-pay

## 2023-06-19 DIAGNOSIS — R3989 Other symptoms and signs involving the genitourinary system: Secondary | ICD-10-CM | POA: Diagnosis not present

## 2023-06-19 MED ORDER — NITROFURANTOIN MONOHYD MACRO 100 MG PO CAPS
100.0000 mg | ORAL_CAPSULE | Freq: Two times a day (BID) | ORAL | 0 refills | Status: DC
  Filled 2023-06-19: qty 5, 3d supply, fill #0

## 2023-06-19 NOTE — Progress Notes (Signed)
Virtual Visit Consent   Jessica Horton, you are scheduled for a virtual visit with a Jessica Horton provider today. Just as with appointments in the office, your consent must be obtained to participate. Your consent will be active for this visit and any virtual visit you may have with one of our providers in the next 365 days. If you have a MyChart account, a copy of this consent can be sent to you electronically.  As this is a virtual visit, video technology does not allow for your provider to perform a traditional examination. This may limit your provider's ability to fully assess your condition. If your provider identifies any concerns that need to be evaluated in person or the need to arrange testing (such as labs, EKG, etc.), we will make arrangements to do so. Although advances in technology are sophisticated, we cannot ensure that it will always work on either your end or our end. If the connection with a video visit is poor, the visit may have to be switched to a telephone visit. With either a video or telephone visit, we are not always able to ensure that we have a secure connection.  By engaging in this virtual visit, you consent to the provision of healthcare and authorize for your insurance to be billed (if applicable) for the services provided during this visit. Depending on your insurance coverage, you may receive a charge related to this service.  I need to obtain your verbal consent now. Are you willing to proceed with your visit today? Chana Maggie Schwalbe Jessica Horton has provided verbal consent on 06/19/2023 for a virtual visit (video or telephone). Freddy Finner, NP  Date: 06/19/2023 9:10 AM  Virtual Visit via Video Note   I, Freddy Finner, connected with  Jessica Horton  ([id number], 1991-04-28) on 06/19/23 at  9:15 AM EDT by a video-enabled telemedicine application and verified that I am speaking with the correct person using two identifiers.  Location: Patient: Virtual Visit  Location Patient: Home Provider: Virtual Visit Location Provider: Home Office   I discussed the limitations of evaluation and management by telemedicine and the availability of in person appointments. The patient expressed understanding and agreed to proceed.    History of Present Illness: Jessica Horton is a 32 y.o. who identifies as a female who was assigned female at birth, and is being seen today for UTI symptoms.  Onset was yesterday- pain with urination- thorns Associated symptom some itching, increased urination  Modifying factors are none Denies chest pain, shortness of breath, fevers, chills, back and or pelvic pain, no new sexual partners, no history of kidney infections, vaginal discharge, blood urine. Not pregnant or breastfeeding.    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   Asthma, chronic 01/22/2014   Migraines 01/22/2014    Allergies:  Allergies  Allergen Reactions   Yellow Jacket Venom Anaphylaxis   Medications:  Current Outpatient Medications:    albuterol (PROVENTIL) (2.5 MG/3ML) 0.083% nebulizer solution, Take 3 mLs (2.5 mg total) by nebulization every 6 (six) hours as needed for wheezing or shortness of breath., Disp: 75 mL, Rfl: 12   albuterol (VENTOLIN HFA) 108 (90 Base) MCG/ACT inhaler, Inhale 1-2 puffs into the lungs every 6 (six) hours as needed for wheezing or shortness  of breath., Disp: 1 each, Rfl: 0   cephALEXin (KEFLEX) 500 MG capsule, Take 1 capsule (500 mg total) by mouth 2 (two) times daily., Disp: 14 capsule, Rfl: 0   Cholecalciferol (VITAMIN D3) 1.25 MG (50000 UT) CAPS, Take 50,000 Units by mouth once a week., Disp: 12 capsule, Rfl: 0   Choriogonadotropin Alfa (OVIDREL) 250 MCG/0.5ML injection, Inject 0.5 mLs (0.25 mg total) into the skin every month  as directed, Disp: 0.5 mL, Rfl: 5   Choriogonadotropin Alfa 250 MCG/0.5ML injection, inject 1 prefilled syringe at exact time indicated as trigger, Disp: 0.5 mL, Rfl: 3   EPINEPHrine 0.3 mg/0.3 mL IJ SOAJ injection, Inject 0.3 mg into the muscle as needed for anaphylaxis., Disp: 2 each, Rfl: 1   ibuprofen (ADVIL) 800 MG tablet, Take 1 tablet (800 mg total) by mouth every 8 (eight) hours as needed (pain)., Disp: 21 tablet, Rfl: 0   influenza vac split quadrivalent PF (FLUARIX) 0.5 ML injection, Inject 0.5 mLs into the muscle., Disp: 0.5 mL, Rfl: 0   letrozole (FEMARA) 2.5 MG tablet, Take 3 tablets (7.5 mg total) by mouth daily on days 3 through 7, Disp: 15 tablet, Rfl: 3   letrozole (FEMARA) 2.5 MG tablet, Take 3 tablets (7.5 mg total) by mouth daily for 5 days., Disp: 15 tablet, Rfl: 0   letrozole (FEMARA) 2.5 MG tablet, Take 3 tablets (7.5 mg total) by mouth daily for 5 days., Disp: 15 tablet, Rfl: 0   rizatriptan (MAXALT) 10 MG tablet, Take 1 tablet (10 mg total) by mouth as needed for migraine. May repeat in 2 hours if needed. No more than 30mg  in 24hrs, Disp: 10 tablet, Rfl: 0  Observations/Objective: Patient is well-developed, well-nourished in no acute distress.  Resting comfortably  at home.  Head is normocephalic, atraumatic.  No labored breathing.  Speech is clear and coherent with logical content.  Patient is alert and oriented at baseline.    Assessment and Plan:  1. Suspected UTI  - nitrofurantoin, macrocrystal-monohydrate, (MACROBID) 100 MG capsule; Take 1 capsule (100 mg total) by mouth 2 (two) times daily.  Dispense: 5 capsule; Refill: 0    -no other red flags for stone or kidney infection -increase fluids -complete medication as discussed -prevention discussed and on AVS  Reviewed side effects, risks and benefits of medication.    Patient acknowledged agreement and understanding of the plan.   Past Medical, Surgical, Social History, Allergies, and Medications have  been Reviewed.    Follow Up Instructions: I discussed the assessment and treatment plan with the patient. The patient was provided an opportunity to ask questions and all were answered. The patient agreed with the plan and demonstrated an understanding of the instructions.  A copy of instructions were sent to the patient via MyChart unless otherwise noted below.     The patient was advised to call back or seek an in-person evaluation if the symptoms worsen or if the condition fails to improve as anticipated.  Time:  I spent 7 minutes with the patient via telehealth technology discussing the above problems/concerns.    Freddy Finner, NP

## 2023-06-19 NOTE — Patient Instructions (Signed)
Stellah Maggie Schwalbe El [name], thank you for joining Freddy Finner, NP for today's virtual visit.  While this provider is not your primary care provider (PCP), if your PCP is located in our provider database this encounter information will be shared with them immediately following your visit.   A [facility] MyChart account gives you access to today's visit and all your visits, tests, and labs performed at [facility] " click here if you don't have a [facility] MyChart account or go to [email or web address]  Consent: (Patient) Jessica Horton provided verbal consent for this virtual visit at the beginning of the encounter.  Current Medications:  Current Outpatient Medications:    nitrofurantoin, macrocrystal-monohydrate, (MACROBID) 100 MG capsule, Take 1 capsule (100 mg total) by mouth 2 (two) times daily., Disp: 5 capsule, Rfl: 0   albuterol (PROVENTIL) (2.5 MG/3ML) 0.083% nebulizer solution, Take 3 mLs (2.5 mg total) by nebulization every 6 (six) hours as needed for wheezing or shortness of breath., Disp: 75 mL, Rfl: 12   albuterol (VENTOLIN HFA) 108 (90 Base) MCG/ACT inhaler, Inhale 1-2 puffs into the lungs every 6 (six) hours as needed for wheezing or shortness of breath., Disp: 1 each, Rfl: 0   cephALEXin (KEFLEX) 500 MG capsule, Take 1 capsule (500 mg total) by mouth 2 (two) times daily., Disp: 14 capsule, Rfl: 0   Cholecalciferol (VITAMIN D3) 1.25 MG (50000 UT) CAPS, Take 50,000 Units by mouth once a week., Disp: 12 capsule, Rfl: 0   Choriogonadotropin Alfa (OVIDREL) 250 MCG/0.5ML injection, Inject 0.5 mLs (0.25 mg total) into the skin every month as directed, Disp: 0.5 mL, Rfl: 5   Choriogonadotropin Alfa 250 MCG/0.5ML injection, inject 1 prefilled syringe at exact time indicated as trigger, Disp: 0.5 mL, Rfl: 3   EPINEPHrine 0.3 mg/0.3 mL IJ SOAJ injection, Inject 0.3 mg into the muscle as needed for anaphylaxis., Disp: 2 each, Rfl: 1   ibuprofen (ADVIL) 800 MG tablet,  Take 1 tablet (800 mg total) by mouth every 8 (eight) hours as needed (pain)., Disp: 21 tablet, Rfl: 0   influenza vac split quadrivalent PF (FLUARIX) 0.5 ML injection, Inject 0.5 mLs into the muscle., Disp: 0.5 mL, Rfl: 0   letrozole (FEMARA) 2.5 MG tablet, Take 3 tablets (7.5 mg total) by mouth daily on days 3 through 7, Disp: 15 tablet, Rfl: 3   letrozole (FEMARA) 2.5 MG tablet, Take 3 tablets (7.5 mg total) by mouth daily for 5 days., Disp: 15 tablet, Rfl: 0   letrozole (FEMARA) 2.5 MG tablet, Take 3 tablets (7.5 mg total) by mouth daily for 5 days., Disp: 15 tablet, Rfl: 0   rizatriptan (MAXALT) 10 MG tablet, Take 1 tablet (10 mg total) by mouth as needed for migraine. May repeat in 2 hours if needed. No more than 30mg  in 24hrs, Disp: 10 tablet, Rfl: 0   Medications ordered in this encounter:  Meds ordered this encounter  Medications   nitrofurantoin, macrocrystal-monohydrate, (MACROBID) 100 MG capsule    Sig: Take 1 capsule (100 mg total) by mouth 2 (two) times daily.    Dispense:  5 capsule    Refill:  0    Order Specific Question:   Supervising Provider    Answer:   Merrilee Jansky [id number]     *If you need refills on other medications prior to your next appointment, please contact your pharmacy*  Follow-Up: Call back or seek an in-person evaluation if the symptoms worsen or if the condition fails to improve as  anticipated.  [facility] [phone]  Other Instructions  Urinary Tract Infection, Adult  A urinary tract infection (UTI) is an infection of any part of the urinary tract. The urinary tract includes the kidneys, ureters, bladder, and urethra. These organs make, store, and get rid of urine in the body. An upper UTI affects the ureters and kidneys. A lower UTI affects the bladder and urethra. What are the causes? Most urinary tract infections are caused by bacteria in your genital area around your urethra, where urine leaves your body. These bacteria  grow and cause inflammation of your urinary tract. What increases the risk? You are more likely to develop this condition if: You have a urinary catheter that stays in place. You are not able to control when you urinate or have a bowel movement (incontinence). You are female and you: Use a spermicide or diaphragm for birth control. Have low estrogen levels. Are pregnant. You have certain genes that increase your risk. You are sexually active. You take antibiotic medicines. You have a condition that causes your flow of urine to slow down, such as: An enlarged prostate, if you are female. Blockage in your urethra. A kidney stone. A nerve condition that affects your bladder control (neurogenic bladder). Not getting enough to drink, or not urinating often. You have certain medical conditions, such as: Diabetes. A weak disease-fighting system (immunesystem). Sickle cell disease. Gout. Spinal cord injury. What are the signs or symptoms? Symptoms of this condition include: Needing to urinate right away (urgency). Frequent urination. This may include small amounts of urine each time you urinate. Pain or burning with urination. Blood in the urine. Urine that smells bad or unusual. Trouble urinating. Cloudy urine. Vaginal discharge, if you are female. Pain in the abdomen or the lower back. You may also have: Vomiting or a decreased appetite. Confusion. Irritability or tiredness. A fever or chills. Diarrhea. The first symptom in older adults may be confusion. In some cases, they may not have any symptoms until the infection has worsened. How is this diagnosed? This condition is diagnosed based on your medical history and a physical exam. You may also have other tests, including: Urine tests. Blood tests. Tests for STIs (sexually transmitted infections). If you have had more than one UTI, a cystoscopy or imaging studies may be done to determine the cause of the infections. How is  this treated? Treatment for this condition includes: Antibiotic medicine. Over-the-counter medicines to treat discomfort. Drinking enough water to stay hydrated. If you have frequent infections or have other conditions such as a kidney stone, you may need to see a health care provider who specializes in the urinary tract (urologist). In rare cases, urinary tract infections can cause sepsis. Sepsis is a life-threatening condition that occurs when the body responds to an infection. Sepsis is treated in the hospital with IV antibiotics, fluids, and other medicines. Follow these instructions at home:  Medicines Take over-the-counter and prescription medicines only as told by your health care provider. If you were prescribed an antibiotic medicine, take it as told by your health care provider. Do not stop using the antibiotic even if you start to feel better. General instructions Make sure you: Empty your bladder often and completely. Do not hold urine for long periods of time. Empty your bladder after sex. Wipe from front to back after urinating or having a bowel movement if you are female. Use each tissue only one time when you wipe. Drink enough fluid to keep your  urine pale yellow. Keep all follow-up visits. This is important. Contact a health care provider if: Your symptoms do not get better after 1-2 days. Your symptoms go away and then return. Get help right away if: You have severe pain in your back or your lower abdomen. You have a fever or chills. You have nausea or vomiting. Summary A urinary tract infection (UTI) is an infection of any part of the urinary tract, which includes the kidneys, ureters, bladder, and urethra. Most urinary tract infections are caused by bacteria in your genital area. Treatment for this condition often includes antibiotic medicines. If you were prescribed an antibiotic medicine, take it as told by your health care provider. Do not stop using the  antibiotic even if you start to feel better. Keep all follow-up visits. This is important. This information is not intended to replace advice given to you by your health care provider. Make sure you discuss any questions you have with your health care provider. Document Revised: 07/23/2020 Document Reviewed: 07/28/2020 Elsevier Patient Education  2024 [employer]    If you have been instructed to have an in-person evaluation today at a local Urgent Care facility, please use the link below. It will take you to a list of all of our available [facility], including address, phone number and hours of operation. Please do not delay care.  [facility]  If you or a family member do not have a primary care provider, use the link below to schedule a visit and establish care. When you choose a [facility] primary care physician or advanced practice provider, you gain a long-term partner in health. Find a Primary Care Provider  Learn more about [facility] in-office and virtual care options: [facility] - Get Care Now

## 2023-07-02 ENCOUNTER — Telehealth: Payer: 59 | Admitting: Family Medicine

## 2023-07-02 ENCOUNTER — Encounter: Payer: Self-pay | Admitting: Family Medicine

## 2023-07-02 ENCOUNTER — Other Ambulatory Visit (HOSPITAL_COMMUNITY): Payer: Self-pay

## 2023-07-02 DIAGNOSIS — W5501XA Bitten by cat, initial encounter: Secondary | ICD-10-CM | POA: Diagnosis not present

## 2023-07-02 DIAGNOSIS — R609 Edema, unspecified: Secondary | ICD-10-CM

## 2023-07-02 MED ORDER — MUPIROCIN 2 % EX OINT
1.0000 | TOPICAL_OINTMENT | Freq: Two times a day (BID) | CUTANEOUS | 0 refills | Status: DC
Start: 2023-07-02 — End: 2024-02-17
  Filled 2023-07-02: qty 22, 11d supply, fill #0

## 2023-07-02 MED ORDER — AMOXICILLIN-POT CLAVULANATE 875-125 MG PO TABS
1.0000 | ORAL_TABLET | Freq: Two times a day (BID) | ORAL | 0 refills | Status: AC
Start: 2023-07-02 — End: 2023-07-09
  Filled 2023-07-02: qty 14, 7d supply, fill #0

## 2023-07-02 NOTE — Progress Notes (Signed)
Virtual Visit Consent   Jessica Horton, you are scheduled for a virtual visit with a  provider today. Just as with appointments in the office, your consent must be obtained to participate. Your consent will be active for this visit and any virtual visit you may have with one of our providers in the next 365 days. If you have a MyChart account, a copy of this consent can be sent to you electronically.  As this is a virtual visit, video technology does not allow for your provider to perform a traditional examination. This may limit your provider's ability to fully assess your condition. If your provider identifies any concerns that need to be evaluated in person or the need to arrange testing (such as labs, EKG, etc.), we will make arrangements to do so. Although advances in technology are sophisticated, we cannot ensure that it will always work on either your end or our end. If the connection with a video visit is poor, the visit may have to be switched to a telephone visit. With either a video or telephone visit, we are not always able to ensure that we have a secure connection.  By engaging in this virtual visit, you consent to the provision of healthcare and authorize for your insurance to be billed (if applicable) for the services provided during this visit. Depending on your insurance coverage, you may receive a charge related to this service.  I need to obtain your verbal consent now. Are you willing to proceed with your visit today? Jessica Horton has provided verbal consent on 07/02/2023 for a virtual visit (video or telephone). Freddy Finner, NP  Date: 07/02/2023 10:02 AM  Virtual Visit via Video Note   I, Freddy Finner, connected with  Jessica Horton  ([id number], 10-Sep-1991) on 07/02/23 at 10:00 AM EDT by a video-enabled telemedicine application and verified that I am speaking with the correct person using two identifiers.  Location: Patient: Virtual Visit  Location Patient: Home Provider: Virtual Visit Location Provider: Home Office   I discussed the limitations of evaluation and management by telemedicine and the availability of in person appointments. The patient expressed understanding and agreed to proceed.    History of Present Illness: Jessica Horton is a 32 y.o. who identifies as a female who was assigned female at birth, and is being seen today for cat bite   Onset was last night trimming cats nails (her cat up to date on vaccines) and she bite her on right wrist and forearm- mildly red and bleed.  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   Asthma, chronic 01/22/2014   Migraines 01/22/2014    Allergies:  Allergies  Allergen Reactions   Yellow Jacket Venom Anaphylaxis   Medications:  Current Outpatient Medications:    albuterol (PROVENTIL) (2.5 MG/3ML) 0.083% nebulizer solution, Take 3 mLs (2.5 mg total) by nebulization every 6 (six) hours as needed for wheezing or shortness of breath., Disp: 75 mL, Rfl: 12   albuterol (VENTOLIN HFA) 108 (90 Base) MCG/ACT inhaler, Inhale  1-2 puffs into the lungs every 6 (six) hours as needed for wheezing or shortness of breath., Disp: 1 each, Rfl: 0   cephALEXin (KEFLEX) 500 MG capsule, Take 1 capsule (500 mg total) by mouth 2 (two) times daily., Disp: 14 capsule, Rfl: 0   Cholecalciferol (VITAMIN D3) 1.25 MG (50000 UT) CAPS, Take 50,000 Units by mouth once a week., Disp: 12 capsule, Rfl: 0   Choriogonadotropin Alfa (OVIDREL) 250 MCG/0.5ML injection, Inject 0.5 mLs  (0.25 mg total) into the skin every month as directed, Disp: 0.5 mL, Rfl: 5   Choriogonadotropin Alfa 250 MCG/0.5ML injection, inject 1 prefilled syringe at exact time indicated as trigger, Disp: 0.5 mL, Rfl: 3   EPINEPHrine 0.3 mg/0.3 mL IJ SOAJ injection, Inject 0.3 mg into the muscle as needed for anaphylaxis., Disp: 2 each, Rfl: 1   ibuprofen (ADVIL) 800 MG tablet, Take 1 tablet (800 mg total) by mouth every 8 (eight) hours as needed (pain)., Disp: 21 tablet, Rfl: 0   influenza vac split quadrivalent PF (FLUARIX) 0.5 ML injection, Inject 0.5 mLs into the muscle., Disp: 0.5 mL, Rfl: 0   letrozole (FEMARA) 2.5 MG tablet, Take 3 tablets (7.5 mg total) by mouth daily on days 3 through 7, Disp: 15 tablet, Rfl: 3   letrozole (FEMARA) 2.5 MG tablet, Take 3 tablets (7.5 mg total) by mouth daily for 5 days., Disp: 15 tablet, Rfl: 0   letrozole (FEMARA) 2.5 MG tablet, Take 3 tablets (7.5 mg total) by mouth daily for 5 days., Disp: 15 tablet, Rfl: 0   nitrofurantoin, macrocrystal-monohydrate, (MACROBID) 100 MG capsule, Take 1 capsule (100 mg total) by mouth 2 (two) times daily., Disp: 5 capsule, Rfl: 0   rizatriptan (MAXALT) 10 MG tablet, Take 1 tablet (10 mg total) by mouth as needed for migraine. May repeat in 2 hours if needed. No more than 30mg  in 24hrs, Disp: 10 tablet, Rfl: 0  Observations/Objective: Patient is well-developed, well-nourished in no acute distress.  Resting comfortably  at home.  Head is normocephalic, atraumatic.  No labored breathing.  Speech is clear and coherent with logical content.  Patient is alert and oriented at baseline.  Right wrist is swollen with noted redness at the main puncture bite site   Assessment and Plan:  1. Cat bite, initial encounter  - amoxicillin-clavulanate (AUGMENTIN) 875-125 MG tablet; Take 1 tablet by mouth 2 (two) times daily for 7 days.  Dispense: 14 tablet; Refill: 0 - mupirocin ointment (BACTROBAN) 2 %; Apply 1 application onto the skin 2  (two) times daily.  Dispense: 22 g; Refill: 0  2. Swelling  - amoxicillin-clavulanate (AUGMENTIN) 875-125 MG tablet; Take 1 tablet by mouth 2 (two) times daily for 7 days.  Dispense: 14 tablet; Refill: 0  -keep area clean and dry- bactroban and gauze while at work- open to air otherwise -strict in person precautions if not seeing improvement in 24-48 hours of starting Augmentin.    Reviewed side effects, risks and benefits of medication.    Patient acknowledged agreement and understanding of the plan.   Past Medical, Surgical, Social History, Allergies, and Medications have been Reviewed.    Follow Up Instructions: I discussed the assessment and treatment plan with the patient. The patient was provided an opportunity to ask questions and all were answered. The patient agreed with the plan and demonstrated an understanding of the instructions.  A copy of instructions were sent to the patient via MyChart unless otherwise noted below.     The patient was  advised to call back or seek an in-person evaluation if the symptoms worsen or if the condition fails to improve as anticipated.  Time:  I spent 10 minutes with the patient via telehealth technology discussing the above problems/concerns.    Freddy Finner, NP

## 2023-07-02 NOTE — Patient Instructions (Addendum)
Marielys Maggie Schwalbe El [name], thank you for joining Freddy Finner, NP for today's virtual visit.  While this provider is not your primary care provider (PCP), if your PCP is located in our provider database this encounter information will be shared with them immediately following your visit.   A [facility] MyChart account gives you access to today's visit and all your visits, tests, and labs performed at [facility] " click here if you don't have a [facility] MyChart account or go to [email or web address]  Consent: (Patient) Jessica Horton provided verbal consent for this virtual visit at the beginning of the encounter.  Current Medications:  Current Outpatient Medications:    albuterol (PROVENTIL) (2.5 MG/3ML) 0.083% nebulizer solution, Take 3 mLs (2.5 mg total) by nebulization every 6 (six) hours as needed for wheezing or shortness of breath., Disp: 75 mL, Rfl: 12   albuterol (VENTOLIN HFA) 108 (90 Base) MCG/ACT inhaler, Inhale 1-2 puffs into the lungs every 6 (six) hours as needed for wheezing or shortness of breath., Disp: 1 each, Rfl: 0   amoxicillin-clavulanate (AUGMENTIN) 875-125 MG tablet, Take 1 tablet by mouth 2 (two) times daily for 7 days., Disp: 14 tablet, Rfl: 0   Cholecalciferol (VITAMIN D3) 1.25 MG (50000 UT) CAPS, Take 50,000 Units by mouth once a week., Disp: 12 capsule, Rfl: 0   Choriogonadotropin Alfa (OVIDREL) 250 MCG/0.5ML injection, Inject 0.5 mLs (0.25 mg total) into the skin every month as directed, Disp: 0.5 mL, Rfl: 5   Choriogonadotropin Alfa 250 MCG/0.5ML injection, inject 1 prefilled syringe at exact time indicated as trigger, Disp: 0.5 mL, Rfl: 3   EPINEPHrine 0.3 mg/0.3 mL IJ SOAJ injection, Inject 0.3 mg into the muscle as needed for anaphylaxis., Disp: 2 each, Rfl: 1   ibuprofen (ADVIL) 800 MG tablet, Take 1 tablet (800 mg total) by mouth every 8 (eight) hours as needed (pain)., Disp: 21 tablet, Rfl: 0   influenza vac split quadrivalent PF  (FLUARIX) 0.5 ML injection, Inject 0.5 mLs into the muscle., Disp: 0.5 mL, Rfl: 0   letrozole (FEMARA) 2.5 MG tablet, Take 3 tablets (7.5 mg total) by mouth daily on days 3 through 7, Disp: 15 tablet, Rfl: 3   letrozole (FEMARA) 2.5 MG tablet, Take 3 tablets (7.5 mg total) by mouth daily for 5 days., Disp: 15 tablet, Rfl: 0   letrozole (FEMARA) 2.5 MG tablet, Take 3 tablets (7.5 mg total) by mouth daily for 5 days., Disp: 15 tablet, Rfl: 0   mupirocin ointment (BACTROBAN) 2 %, Apply 1 application onto the skin 2 (two) times daily., Disp: 22 g, Rfl: 0   rizatriptan (MAXALT) 10 MG tablet, Take 1 tablet (10 mg total) by mouth as needed for migraine. May repeat in 2 hours if needed. No more than 30mg  in 24hrs, Disp: 10 tablet, Rfl: 0   Medications ordered in this encounter:  Meds ordered this encounter  Medications   amoxicillin-clavulanate (AUGMENTIN) 875-125 MG tablet    Sig: Take 1 tablet by mouth 2 (two) times daily for 7 days.    Dispense:  14 tablet    Refill:  0    Order Specific Question:   Supervising Provider    Answer:   Merrilee Jansky [[id number]]   mupirocin ointment (BACTROBAN) 2 %    Sig: Apply 1 application onto the skin 2 (two) times daily.    Dispense:  22 g    Refill:  0    Order Specific Question:   Supervising Provider  AnswerMerrilee Jansky [[id number]]     *If you need refills on other medications prior to your next appointment, please contact your pharmacy*  Follow-Up: Call back or seek an in-person evaluation if the symptoms worsen or if the condition fails to improve as anticipated.  [facility] [phone]  Other Instructions Animal Bite, Adult Animal bites can be mild or serious. Small bites from house pets normally are mild. Bites from cats, strays, or wild animals can be serious. If a stray or wild animal bites you, you need to get medical help right away. You may also need a shot to prevent rabies infection. What increases the  risk? Being near pets you do not know. Being near animals that are eating, sleeping, or caring for their babies. Being outside where small, wild animals move freely. What are the signs or symptoms? Pain. Bleeding. Swelling. Bruising. How is this treated? Treatment may include: Cleaning your wound. Rinsing out (flushing) your wound. This uses saline solution, which is made of salt and water. Putting a bandage on your wound. Closing your wound with stitches (sutures), staples, skin glue, or skin tape (adhesive strips). Antibiotic medicine. You may be given pills, cream, gel, or fluid through an IV. A tetanus shot. Rabies treatment, if the animal could have rabies. Surgery, if there is infection or damage that needs to be fixed. Follow these instructions at home: Medicines Take or apply over-the-counter and prescription medicines only as told by your doctor. If you were prescribed an antibiotic medicine, take or apply it as told by your doctor. Do not stop using it even if your wound gets better. Wound care  Follow instructions from your doctor about how to take care of your wound. Make sure you: Wash your hands with soap and water for at least 20 seconds before and after you change your bandage. If you cannot use soap and water, use hand sanitizer. Change your bandage. Leave stitches or skin glue in place for at least 2 weeks. Leave tape strips alone unless you are told to take them off. You may trim the edges of the tape strips if they curl up. Check your wound every day for signs of infection. Check for: More redness, swelling, or pain. More fluid or blood. Warmth. Pus or a bad smell. General instructions  Raise (elevate) the injured area above the level of your heart while you are sitting or lying down. If told, put ice on the injured area. To do this: Put ice in a plastic bag. Place a towel between your skin and the bag. Leave the ice on for 20 minutes, 2-3 times per  day. Take off the ice if your skin turns bright red. This is very important. If you cannot feel pain, heat, or cold, you have a greater risk of damage to the area. Keep all follow-up visits. Contact a doctor if: You have more redness, swelling, or pain around your wound. Your wound feels warm to the touch. You have a fever or chills. You have a general feeling of sickness (malaise). You feel like you may vomit. You vomit. You have pain that does not get better. Get help right away if: You have a red streak going away from your wound. You have any of these coming from your wound: Non-clear fluid. More blood. Pus or a bad smell. You have trouble moving your injured area. You lose feeling (have numbness) or feel tingling anywhere on your body. Summary Animal bites can  be mild or serious. If a stray or wild animal bites you, you need to get medical help right away. Your doctor will look at the wound and may ask about how the animal bite happened. Treatment may include wound care, antibiotic medicine, a tetanus shot, and rabies treatment. This information is not intended to replace advice given to you by your health care provider. Make sure you discuss any questions you have with your health care provider. Document Revised: 12/21/2021 Document Reviewed: 12/21/2021 Elsevier Patient Education  2024 Elsevier Inc.    If you have been instructed to have an in-person evaluation today at a local Urgent Care facility, please use the link below. It will take you to a list of all of our available [facility], including address, phone number and hours of operation. Please do not delay care.  [facility]  If you or a family member do not have a primary care provider, use the link below to schedule a visit and establish care. When you choose a [facility] primary care physician or advanced practice provider, you gain a long-term partner in health. Find a Primary Care  Provider  Learn more about [facility]'s in-office and virtual care options: [facility] - Get Care Now

## 2023-07-24 ENCOUNTER — Other Ambulatory Visit (HOSPITAL_COMMUNITY): Payer: Self-pay

## 2023-07-30 ENCOUNTER — Encounter (INDEPENDENT_AMBULATORY_CARE_PROVIDER_SITE_OTHER): Payer: Self-pay

## 2023-08-06 ENCOUNTER — Other Ambulatory Visit (HOSPITAL_COMMUNITY): Payer: Self-pay

## 2023-08-13 ENCOUNTER — Encounter: Payer: Self-pay | Admitting: Nurse Practitioner

## 2023-08-13 ENCOUNTER — Ambulatory Visit (INDEPENDENT_AMBULATORY_CARE_PROVIDER_SITE_OTHER): Payer: 59 | Admitting: Nurse Practitioner

## 2023-08-13 VITALS — BP 104/70 | HR 70 | Temp 98.2°F | Resp 16 | Ht 62.0 in | Wt 189.0 lb

## 2023-08-13 DIAGNOSIS — Z0000 Encounter for general adult medical examination without abnormal findings: Secondary | ICD-10-CM | POA: Diagnosis not present

## 2023-08-13 LAB — COMPREHENSIVE METABOLIC PANEL
ALT: 18 U/L (ref 0–35)
AST: 16 U/L (ref 0–37)
Albumin: 4.3 g/dL (ref 3.5–5.2)
Alkaline Phosphatase: 77 U/L (ref 39–117)
BUN: 8 mg/dL (ref 6–23)
CO2: 28 mEq/L (ref 19–32)
Calcium: 9.6 mg/dL (ref 8.4–10.5)
Chloride: 102 mEq/L (ref 96–112)
Creatinine, Ser: 0.53 mg/dL (ref 0.40–1.20)
GFR: 124.26 mL/min (ref >60.00)
Glucose, Bld: 93 mg/dL (ref 70–99)
Potassium: 4.3 mEq/L (ref 3.5–5.1)
Sodium: 136 mEq/L (ref 135–145)
Total Bilirubin: 0.8 mg/dL (ref 0.2–1.2)
Total Protein: 7.1 g/dL (ref 6.0–8.3)

## 2023-08-13 NOTE — Patient Instructions (Signed)
Go to lab Continue Heart healthy diet and daily exercise.  Preventive Care 21-30 Years Old, Female Preventive care refers to lifestyle choices and visits with your health care provider that can promote health and wellness. Preventive care visits are also called wellness exams. What can I expect for my preventive care visit? Counseling During your preventive care visit, your health care provider may ask about your: Medical history, including: Past medical problems. Family medical history. Pregnancy history. Current health, including: Menstrual cycle. Method of birth control. Emotional well-being. Home life and relationship well-being. Sexual activity and sexual health. Lifestyle, including: Alcohol, nicotine or tobacco, and drug use. Access to firearms. Diet, exercise, and sleep habits. Work and work environment. Sunscreen use. Safety issues such as seatbelt and bike helmet use. Physical exam Your health care provider may check your: Height and weight. These may be used to calculate your BMI (body mass index). BMI is a measurement that tells if you are at a healthy weight. Waist circumference. This measures the distance around your waistline. This measurement also tells if you are at a healthy weight and may help predict your risk of certain diseases, such as type 2 diabetes and high blood pressure. Heart rate and blood pressure. Body temperature. Skin for abnormal spots. What immunizations do I need?  Vaccines are usually given at various ages, according to a schedule. Your health care provider will recommend vaccines for you based on your age, medical history, and lifestyle or other factors, such as travel or where you work. What tests do I need? Screening Your health care provider may recommend screening tests for certain conditions. This may include: Pelvic exam and Pap test. Lipid and cholesterol levels. Diabetes screening. This is done by checking your blood sugar  (glucose) after you have not eaten for a while (fasting). Hepatitis B test. Hepatitis C test. HIV (human immunodeficiency virus) test. STI (sexually transmitted infection) testing, if you are at risk. BRCA-related cancer screening. This may be done if you have a family history of breast, ovarian, tubal, or peritoneal cancers. Talk with your health care provider about your test results, treatment options, and if necessary, the need for more tests. Follow these instructions at home: Eating and drinking  Eat a healthy diet that includes fresh fruits and vegetables, whole grains, lean protein, and low-fat dairy products. Take vitamin and mineral supplements as recommended by your health care provider. Do not drink alcohol if: Your health care provider tells you not to drink. You are pregnant, may be pregnant, or are planning to become pregnant. If you drink alcohol: Limit how much you have to 0-1 drink a day. Know how much alcohol is in your drink. In the [place] one drink equals one 12 oz bottle of beer (355 mL), one 5 oz glass of wine (148 mL), or one 1 oz glass of hard liquor (44 mL). Lifestyle Brush your teeth every morning and night with fluoride toothpaste. Floss one time each day. Exercise for at least 30 minutes 5 or more days each week. Do not use any products that contain nicotine or tobacco. These products include cigarettes, chewing tobacco, and vaping devices, such as e-cigarettes. If you need help quitting, ask your health care provider. Do not use drugs. If you are sexually active, practice safe sex. Use a condom or other form of protection to prevent STIs. If you do not wish to become pregnant, use a form of birth control. If you plan to become pregnant, see your health care provider for a   prepregnancy visit. Find healthy ways to manage stress, such as: Meditation, yoga, or listening to music. Journaling. Talking to a trusted person. Spending time with friends and  family. Minimize exposure to UV radiation to reduce your risk of skin cancer. Safety Always wear your seat belt while driving or riding in a vehicle. Do not drive: If you have been drinking alcohol. Do not ride with someone who has been drinking. If you have been using any mind-altering substances or drugs. While texting. When you are tired or distracted. Wear a helmet and other protective equipment during sports activities. If you have firearms in your house, make sure you follow all gun safety procedures. Seek help if you have been physically or sexually abused. What's next? Go to your health care provider once a year for an annual wellness visit. Ask your health care provider how often you should have your eyes and teeth checked. Stay up to date on all vaccines. This information is not intended to replace advice given to you by your health care provider. Make sure you discuss any questions you have with your health care provider. Document Revised: 06/13/2021 Document Reviewed: 06/13/2021 Elsevier Patient Education  2024 Elsevier Inc.  

## 2023-08-13 NOTE — Progress Notes (Signed)
Stable Follow instructions as discussed during office visit.

## 2023-08-13 NOTE — Progress Notes (Signed)
Complete physical exam  Patient: Jessica Horton   DOB: April 05, 1991   32 y.o. Female  MRN: [id number] Visit Date: 08/13/2023  Subjective:    Chief Complaint  Patient presents with   Annual Exam    fasting   Jessica Horton is a 32 y.o. female who presents today for a complete physical exam. She reports consuming a general diet.  Walking daily and swimming daily  She generally feels well. She reports sleeping well. She does not have additional problems to discuss today.  Vision:No Dental:Yes STD Screen:No  BP Readings from Last 3 Encounters:  08/13/23 104/70  08/11/22 106/71  07/30/22 120/78   Wt Readings from Last 3 Encounters:  08/13/23 189 lb (85.7 kg)  08/27/22 190 lb (86.2 kg)  08/10/22 190 lb (86.2 kg)   Most recent fall risk assessment:    08/13/2023    8:54 AM  Fall Risk   Falls in the past year? 0  Number falls in past yr: 0  Injury with Fall? 0  Risk for fall due to : No Fall Risks  Follow up Falls evaluation completed   Depression screen:Yes - No Depression  Most recent depression screenings:    08/13/2023    8:54 AM 12/19/2021    4:09 PM  PHQ 2/9 Scores  PHQ - 2 Score 0 0  PHQ- 9 Score 0     HPI  No problem-specific Assessment & Plan notes found for this encounter.   Past Medical History:  Diagnosis Date   Asthma    Asthma    Headache(784.0)    Migraine    Past Surgical History:  Procedure Laterality Date   CESAREAN SECTION N/A 01/24/2014   Procedure: CESAREAN SECTION;  Surgeon: Esmeralda Arthur, MD;  Location: [facility];  Service: Obstetrics;  Laterality: N/A;   WISDOM TOOTH EXTRACTION     Social History   Socioeconomic History   Marital status: Married    Spouse name: Not on file   Number of children: Not on file   Years of education: Not on file   Highest education level: Not on file  Occupational History   Not on file  Tobacco Use   Smoking status: Never   Smokeless tobacco: Never  Vaping Use   Vaping status: Never  Used  Substance and Sexual Activity   Alcohol use: No   Drug use: No   Sexual activity: Yes    Birth control/protection: None  Other Topics Concern   Not on file  Social History Narrative   Not on file   Social Determinants of Health   Financial Resource Strain: Not on file  Food Insecurity: No Food Insecurity (06/11/2021)   Hunger Vital Sign    Worried About Running Out of Food in the Last Year: Never true    Ran Out of Food in the Last Year: Never true  Transportation Needs: No Transportation Needs (06/11/2021)   PRAPARE - Administrator, Civil Service (Medical): No    Lack of Transportation (Non-Medical): No  Physical Activity: Not on file  Stress: Not on file  Social Connections: Not on file  Intimate Partner Violence: Not on file   Family Status  Relation Name Status   Mother  Alive   Father  Alive   Sister  Alive   Mat Aunt  Alive   Mat Uncle  Alive   MGM  Alive   MGF  Alive   PGM  Alive   PGF  Alive  No partnership data on file   Family History  Problem Relation Age of Onset   Diabetes Mother    Heart disease Father 56       CAD/MI   Diabetes Mellitus I Sister    Diabetes Maternal Aunt    Diabetes Maternal Uncle    Allergies  Allergen Reactions   Yellow Jacket Venom Anaphylaxis    Patient Care Team: Donnice Nielsen, Bonna Gains, NP as PCP - General (Internal Medicine)   Medications: Outpatient Medications Prior to Visit  Medication Sig   albuterol (PROVENTIL) (2.5 MG/3ML) 0.083% nebulizer solution Take 3 mLs (2.5 mg total) by nebulization every 6 (six) hours as needed for wheezing or shortness of breath.   albuterol (VENTOLIN HFA) 108 (90 Base) MCG/ACT inhaler Inhale 1-2 puffs into the lungs every 6 (six) hours as needed for wheezing or shortness of breath.   Cholecalciferol (VITAMIN D3) 1.25 MG (50000 UT) CAPS Take 50,000 Units by mouth once a week.   Choriogonadotropin Alfa (OVIDREL) 250 MCG/0.5ML injection Inject 0.5 mLs (0.25 mg total) into the  skin every month as directed   Choriogonadotropin Alfa 250 MCG/0.5ML injection inject 1 prefilled syringe at exact time indicated as trigger   EPINEPHrine 0.3 mg/0.3 mL IJ SOAJ injection Inject 0.3 mg into the muscle as needed for anaphylaxis.   ibuprofen (ADVIL) 800 MG tablet Take 1 tablet (800 mg total) by mouth every 8 (eight) hours as needed (pain).   influenza vac split quadrivalent PF (FLUARIX) 0.5 ML injection Inject 0.5 mLs into the muscle.   letrozole (FEMARA) 2.5 MG tablet Take 3 tablets (7.5 mg total) by mouth daily on days 3 through 7   letrozole (FEMARA) 2.5 MG tablet Take 3 tablets (7.5 mg total) by mouth daily for 5 days.   letrozole (FEMARA) 2.5 MG tablet Take 3 tablets (7.5 mg total) by mouth daily for 5 days.   mupirocin ointment (BACTROBAN) 2 % Apply 1 application onto the skin 2 (two) times daily.   rizatriptan (MAXALT) 10 MG tablet Take 1 tablet (10 mg total) by mouth as needed for migraine. May repeat in 2 hours if needed. No more than 30mg  in 24hrs   No facility-administered medications prior to visit.   Review of Systems  Constitutional:  Negative for activity change, appetite change and unexpected weight change.  Respiratory: Negative.    Cardiovascular: Negative.   Gastrointestinal: Negative.   Endocrine: Negative for cold intolerance and heat intolerance.  Genitourinary: Negative.   Musculoskeletal: Negative.   Skin: Negative.   Neurological: Negative.   Hematological: Negative.   Psychiatric/Behavioral:  Negative for behavioral problems, decreased concentration, dysphoric mood, hallucinations, self-injury, sleep disturbance and suicidal ideas. The patient is not nervous/anxious.    Last CBC Lab Results  Component Value Date   WBC 6.8 12/27/2021   HGB 12.8 12/27/2021   HCT 38.8 12/27/2021   MCV 89.6 12/27/2021   MCH 29.6 12/27/2021   RDW 11.8 12/27/2021   PLT 267 12/27/2021   Last metabolic panel Lab Results  Component Value Date   GLUCOSE 91  12/27/2021   NA 137 12/27/2021   K 4.4 12/27/2021   CL 106 12/27/2021   CO2 23 12/27/2021   BUN 13 12/27/2021   CREATININE 0.57 12/27/2021   GFRNONAA >60 07/04/2018   CALCIUM 9.1 12/27/2021   PROT 7.0 12/27/2021   ALBUMIN 3.9 07/04/2018   BILITOT 0.6 12/27/2021   ALKPHOS 65 07/04/2018   AST 13 12/27/2021   ALT 15 12/27/2021   ANIONGAP  12 07/04/2018   Last lipids Lab Results  Component Value Date   CHOL 163 12/27/2021   HDL 55 12/27/2021   LDLCALC 94 12/27/2021   TRIG 54 12/27/2021   CHOLHDL 3.0 12/27/2021        Objective:  BP 104/70 (BP Location: Left Arm, Patient Position: Sitting, Cuff Size: Normal)   Pulse 70   Temp 98.2 F (36.8 C) (Temporal)   Resp 16   Ht 5\' 2"  (1.575 m)   Wt 189 lb (85.7 kg)   LMP 07/26/2023 (Exact Date)   SpO2 98%   BMI 34.57 kg/m     Physical Exam Vitals and nursing note reviewed.  Constitutional:      General: She is not in acute distress. HENT:     Right Ear: Tympanic membrane, ear canal and external ear normal.     Left Ear: Tympanic membrane, ear canal and external ear normal.     Nose: Nose normal.  Eyes:     Extraocular Movements: Extraocular movements intact.     Conjunctiva/sclera: Conjunctivae normal.     Pupils: Pupils are equal, round, and reactive to light.  Neck:     Thyroid: No thyroid mass, thyromegaly or thyroid tenderness.  Cardiovascular:     Rate and Rhythm: Normal rate and regular rhythm.     Pulses: Normal pulses.     Heart sounds: Normal heart sounds.  Pulmonary:     Effort: Pulmonary effort is normal.     Breath sounds: Normal breath sounds.  Abdominal:     General: Bowel sounds are normal.     Palpations: Abdomen is soft.  Musculoskeletal:        General: Normal range of motion.     Cervical back: Normal range of motion and neck supple.     Right lower leg: No edema.     Left lower leg: No edema.  Lymphadenopathy:     Cervical: No cervical adenopathy.  Skin:    General: Skin is warm and dry.   Neurological:     Mental Status: She is alert and oriented to person, place, and time.     Cranial Nerves: No cranial nerve deficit.  Psychiatric:        Mood and Affect: Mood normal.        Behavior: Behavior normal.        Thought Content: Thought content normal.    No results found for any visits on 08/13/23.    Assessment & Plan:    Routine Health Maintenance and Physical Exam  Immunization History  Administered Date(s) Administered   Influenza Split 10/14/2013   Influenza,inj,Quad PF,6+ Mos 09/25/2018, 10/22/2021, 10/28/2022   Influenza,inj,quad, With Preservative 10/27/2020   Influenza-Unspecified 10/03/2018   PFIZER(Purple Top)SARS-COV-2 Vaccination 09/10/2020, 10/02/2020   Tdap 11/11/2013, 09/14/2018    Health Maintenance  Topic Date Due   COVID-19 Vaccine (3 - 2023-24 season) 08/30/2022   INFLUENZA VACCINE  07/31/2023   Hepatitis C Screening  08/12/2024 (Originally 05/17/2011)   PAP SMEAR-Modifier  12/14/2023   DTaP/Tdap/Td (3 - Td or Tdap) 09/14/2028   HIV Screening  Completed   HPV VACCINES  Aged Out   Discussed health benefits of physical activity, and encouraged her to engage in regular exercise appropriate for her age and condition.  Problem List Items Addressed This Visit   None Visit Diagnoses     Preventative health care    -  Primary   Relevant Orders   Comprehensive metabolic panel      Return  in about 1 year (around 08/12/2024) for CPE (fasting).     [name], NP

## 2023-08-25 ENCOUNTER — Other Ambulatory Visit (HOSPITAL_COMMUNITY): Payer: Self-pay

## 2023-08-25 MED ORDER — MEDROXYPROGESTERONE ACETATE 10 MG PO TABS
10.0000 mg | ORAL_TABLET | Freq: Every day | ORAL | 0 refills | Status: DC
  Filled 2023-08-25: qty 5, 5d supply, fill #0

## 2023-09-04 ENCOUNTER — Other Ambulatory Visit (HOSPITAL_COMMUNITY): Payer: Self-pay

## 2023-10-30 ENCOUNTER — Encounter: Payer: Self-pay | Admitting: Nurse Practitioner

## 2024-01-14 ENCOUNTER — Other Ambulatory Visit (HOSPITAL_COMMUNITY): Payer: Self-pay

## 2024-01-14 ENCOUNTER — Other Ambulatory Visit: Payer: Self-pay | Admitting: Nurse Practitioner

## 2024-01-14 ENCOUNTER — Ambulatory Visit: Payer: Commercial Managed Care - PPO | Admitting: Nurse Practitioner

## 2024-01-14 ENCOUNTER — Encounter: Payer: Self-pay | Admitting: Nurse Practitioner

## 2024-01-14 VITALS — BP 112/65 | HR 81 | Temp 98.5°F | Resp 18 | Ht 62.0 in | Wt 199.6 lb

## 2024-01-14 DIAGNOSIS — Z6836 Body mass index (BMI) 36.0-36.9, adult: Secondary | ICD-10-CM | POA: Diagnosis not present

## 2024-01-14 DIAGNOSIS — E6609 Other obesity due to excess calories: Secondary | ICD-10-CM

## 2024-01-14 DIAGNOSIS — G43709 Chronic migraine without aura, not intractable, without status migrainosus: Secondary | ICD-10-CM

## 2024-01-14 DIAGNOSIS — E66812 Obesity, class 2: Secondary | ICD-10-CM

## 2024-01-14 LAB — POCT URINE PREGNANCY: Preg Test, Ur: NEGATIVE

## 2024-01-14 LAB — POCT GLYCOSYLATED HEMOGLOBIN (HGB A1C): Hemoglobin A1C: 5.7 % — AB (ref 4.0–5.6)

## 2024-01-14 MED ORDER — RIZATRIPTAN BENZOATE 10 MG PO TABS
10.0000 mg | ORAL_TABLET | ORAL | 0 refills | Status: DC | PRN
Start: 2024-01-14 — End: 2024-07-09
  Filled 2024-01-14: qty 10, 30d supply, fill #0

## 2024-01-14 MED ORDER — QULIPTA 30 MG PO TABS
1.0000 | ORAL_TABLET | Freq: Every day | ORAL | 2 refills | Status: AC
Start: 2024-01-14 — End: ?
  Filled 2024-01-14 – 2024-01-16 (×3): qty 30, 30d supply, fill #0
  Filled 2024-02-13: qty 30, 30d supply, fill #1

## 2024-01-14 NOTE — Assessment & Plan Note (Addendum)
 Reports uncontrolled migraines: has headache daily, migraine 3-4x/month, each episode last 1-2days. Describes as tightness in frontal lobe which radiates to occipital region, denies any aura, associated with light sensitivity and blurry vision in left eye. Triggers: bright light, prolonged use of computer screen, change in weather. Previous use of fioricet, excedrin, NSAIDs, tylenol , maxalt  10-20mg  with minimal relief. Unable to tolerate propanolol in past due to hypotension and bradycardia  Denies use of elavil due to risk of weight gain and topamax due to no contraception. Negative urine pregnancy. Advised to prevent pregnancy with use of condoms Agreed to start Qulipta  30mg  dily Maintain maxalt  10-20mg  prn F/up in 48month

## 2024-01-14 NOTE — Progress Notes (Signed)
 Established Patient Visit  Patient: Jessica Horton   DOB: 11/14/1991   32 y.o. Female  MRN: [id number] Visit Date: 01/14/2024  Subjective:    Chief Complaint  Patient presents with   Follow-up    Migraines and weight management. PT voiced she have headaches everyday with mirgranes twice per week; Maxalt  10 mg hasn't helped much; cervical screening is due    HPI Obesity Reports difficulty with weight loss despite diet modification and exercise. She has never participated in any weight loss program Exercise: walking a day Diet: decreased caloric intake to 1200kcal daily She denies any difficulty with increased appetite, sugar or carb cravings. She has noticed increased snoring with weight gain. She denies any daytime somnolence She took femara  x9inj and ovidrel  x3inj in last 12months: stopped injections 6months ago. No contraception used at this time Wt Readings from Last 3 Encounters:  01/14/24 199 lb 9.6 oz (90.5 kg)  08/13/23 189 lb (85.7 kg)  08/27/22 190 lb (86.2 kg)    Advised to maintain upcoming appointment with healthy weight clinic 01/28/24. Check hgbA1c: 5.8% Normal Tsh and fasting glucose in past. Avoid GLP-1 due to lack of insurance, avoid Qsymia due to lack of contraception and already restricted caloric intake, Avoid contrave due to uncontrolled migraine and no contraception.   Migraines Reports uncontrolled migraines: has headache daily, migraine 3-4x/month, each episode last 1-2days. Describes as tightness in frontal lobe which radiates to occipital region, denies any aura, associated with light sensitivity and blurry vision in left eye. Triggers: bright light, prolonged use of computer screen, change in weather. Previous use of fioricet, excedrin, NSAIDs, tylenol , maxalt  10-20mg  with minimal relief. Unable to tolerate propanolol in past due to hypotension and bradycardia  Denies use of elavil due to risk of weight gain and topamax due  to no contraception. Negative urine pregnancy. Advised to prevent pregnancy with use of condoms Agreed to start Qulipta  30mg  dily Maintain maxalt  10-20mg  prn F/up in 21month  Reviewed medical, surgical, and social history today  Medications: Outpatient Medications Prior to Visit  Medication Sig Note   albuterol  (PROVENTIL ) (2.5 MG/3ML) 0.083% nebulizer solution Take 3 mLs (2.5 mg total) by nebulization every 6 (six) hours as needed for wheezing or shortness of breath. 01/14/2024: Refills needed    albuterol  (VENTOLIN  HFA) 108 (90 Base) MCG/ACT inhaler Inhale 1-2 puffs into the lungs every 6 (six) hours as needed for wheezing or shortness of breath. 01/14/2024: Refills needed   Choriogonadotropin Alfa  250 MCG/0.5ML injection inject 1 prefilled syringe at exact time indicated as trigger    EPINEPHrine  0.3 mg/0.3 mL IJ SOAJ injection Inject 0.3 mg into the muscle as needed for anaphylaxis.    ibuprofen  (ADVIL ) 800 MG tablet Take 1 tablet (800 mg total) by mouth every 8 (eight) hours as needed (pain).    influenza vac split quadrivalent PF (FLUARIX) 0.5 ML injection Inject 0.5 mLs into the muscle.    mupirocin  ointment (BACTROBAN ) 2 % Apply 1 application onto the skin 2 (two) times daily.    rizatriptan  (MAXALT ) 10 MG tablet Take 1 tablet (10 mg total) by mouth as needed for migraine. May repeat in 2 hours if needed. No more than 30mg  in 24hrs 01/14/2024: Not helping   Cholecalciferol  (VITAMIN D3) 1.25 MG (50000 UT) CAPS Take 50,000 Units by mouth once a week. (Patient not taking: Reported on 01/14/2024)    Choriogonadotropin Alfa  (OVIDREL ) 250 MCG/0.5ML injection  Inject 0.5 mLs (0.25 mg total) into the skin every month as directed (Patient not taking: Reported on 01/14/2024)    [DISCONTINUED] letrozole  (FEMARA ) 2.5 MG tablet Take 3 tablets (7.5 mg total) by mouth daily on days 3 through 7 (Patient not taking: Reported on 01/14/2024)    [DISCONTINUED] letrozole  (FEMARA ) 2.5 MG tablet Take 3 tablets (7.5  mg total) by mouth daily for 5 days. (Patient not taking: Reported on 01/14/2024)    [DISCONTINUED] letrozole  (FEMARA ) 2.5 MG tablet Take 3 tablets (7.5 mg total) by mouth daily for 5 days. (Patient not taking: Reported on 01/14/2024)    [DISCONTINUED] medroxyPROGESTERone  (PROVERA ) 10 MG tablet Take 1 tablet (10 mg total) by mouth daily for 5 days (Patient not taking: Reported on 01/14/2024)    No facility-administered medications prior to visit.   Reviewed past medical and social history.   ROS per HPI above      Objective:  BP 112/65 (BP Location: Left Arm, Patient Position: Sitting, Cuff Size: Large)   Pulse 81   Temp 98.5 F (36.9 C) (Temporal)   Resp 18   Ht 5\' 2"  (1.575 m)   Wt 199 lb 9.6 oz (90.5 kg)   LMP 12/17/2023 (Exact Date)   SpO2 98%   BMI 36.51 kg/m      Physical Exam Vitals and nursing note reviewed.  Constitutional:      Appearance: She is obese.  Eyes:     Extraocular Movements: Extraocular movements intact.     Conjunctiva/sclera: Conjunctivae normal.  Cardiovascular:     Rate and Rhythm: Normal rate.     Pulses: Normal pulses.  Pulmonary:     Effort: Pulmonary effort is normal.  Musculoskeletal:     Cervical back: Normal range of motion and neck supple.  Neurological:     Mental Status: She is alert and oriented to person, place, and time.     Cranial Nerves: No cranial nerve deficit.  Psychiatric:        Mood and Affect: Mood normal.        Behavior: Behavior normal.        Thought Content: Thought content normal.     Results for orders placed or performed in visit on 01/14/24  POCT glycosylated hemoglobin (Hb A1C)  Result Value Ref Range   Hemoglobin A1C 5.7 (A) 4.0 - 5.6 %   HbA1c POC (<> result, manual entry)     HbA1c, POC (prediabetic range)     HbA1c, POC (controlled diabetic range)    POCT urine pregnancy  Result Value Ref Range   Preg Test, Ur Negative Negative      Assessment & Plan:    Problem List Items Addressed This Visit      Migraines   Reports uncontrolled migraines: has headache daily, migraine 3-4x/month, each episode last 1-2days. Describes as tightness in frontal lobe which radiates to occipital region, denies any aura, associated with light sensitivity and blurry vision in left eye. Triggers: bright light, prolonged use of computer screen, change in weather. Previous use of fioricet, excedrin, NSAIDs, tylenol , maxalt  10-20mg  with minimal relief. Unable to tolerate propanolol in past due to hypotension and bradycardia  Denies use of elavil due to risk of weight gain and topamax due to no contraception. Negative urine pregnancy. Advised to prevent pregnancy with use of condoms Agreed to start Qulipta  30mg  dily Maintain maxalt  10-20mg  prn F/up in 80month      Relevant Medications   Atogepant  (QULIPTA ) 30 MG TABS   Obesity -  Primary   Reports difficulty with weight loss despite diet modification and exercise. She has never participated in any weight loss program Exercise: walking a day Diet: decreased caloric intake to 1200kcal daily She denies any difficulty with increased appetite, sugar or carb cravings. She has noticed increased snoring with weight gain. She denies any daytime somnolence She took femara  x9inj and ovidrel  x3inj in last 12months: stopped injections 6months ago. No contraception used at this time Wt Readings from Last 3 Encounters:  01/14/24 199 lb 9.6 oz (90.5 kg)  08/13/23 189 lb (85.7 kg)  08/27/22 190 lb (86.2 kg)    Advised to maintain upcoming appointment with healthy weight clinic 01/28/24. Check hgbA1c: 5.8% Normal Tsh and fasting glucose in past. Avoid GLP-1 due to lack of insurance, avoid Qsymia due to lack of contraception and already restricted caloric intake, Avoid contrave due to uncontrolled migraine and no contraception.       Relevant Orders   POCT glycosylated hemoglobin (Hb A1C) (Completed)   POCT urine pregnancy (Completed)   Return in about 4  weeks (around 02/11/2024) for Migraine (F2F or video).     Kathrene Parents, NP

## 2024-01-14 NOTE — Assessment & Plan Note (Addendum)
 Reports difficulty with weight loss despite diet modification and exercise. She has never participated in any weight loss program Exercise: walking a day Diet: decreased caloric intake to 1200kcal daily She denies any difficulty with increased appetite, sugar or carb cravings. She has noticed increased snoring with weight gain. She denies any daytime somnolence She took femara  x9inj and ovidrel  x3inj in last 12months: stopped injections 6months ago. No contraception used at this time Wt Readings from Last 3 Encounters:  01/14/24 199 lb 9.6 oz (90.5 kg)  08/13/23 189 lb (85.7 kg)  08/27/22 190 lb (86.2 kg)    Advised to maintain upcoming appointment with healthy weight clinic 01/28/24. Check hgbA1c: 5.8% Normal Tsh and fasting glucose in past. Avoid GLP-1 due to lack of insurance, avoid Qsymia due to lack of contraception and already restricted caloric intake, Avoid contrave due to uncontrolled migraine and no contraception.

## 2024-01-14 NOTE — Patient Instructions (Addendum)
 Normal hgbA1c Maintain appointment with weight management clinic Maintain maxalt  dose Start Qulipta  Go to lab for urine pregnancy  Chronic Migraine Headache A migraine is a type of headache that is usually stronger and more sudden than other headaches. This type of headache feels like an intense pulsing or throbbing pain that is usually felt on one side of the head. It may also cause other symptoms, such as nausea, vomiting, and sensitivity to light and noise. A migraine is called a chronic migraine if it happens at least 15 days in a month for more than 3 months. Talk with your health care provider about what things may bring on (trigger) your migraines. What are the causes? The exact cause is not known. However, a migraine may be caused when nerves in the brain become irritated and release chemicals that cause blood vessels to become inflamed. This inflammation causes pain. Migraines may be triggered or caused by: Smoking. Medicines, such as birth control pills or some blood pressure medicines. Foods or drinks that contain nitrates, glutamate, aspartame, MSG, or tyramine. Certain foods or drinks, such as aged cheeses, chocolate, alcohol, or caffeine . Other triggers may include: Menstruation. Emotional stress. Getting too much or too little sleep. Tiredness (fatigue). Bright lights or loud noises. Certain smells. Weather changes and high altitude. What increases the risk? The following factors may make you more likely to have chronic migraines: Having migraines or a family history of migraines. Having a mental health condition, such as depression or anxiety. Taking a lot of pain medicine. Having sleep problems. Having heart disease, diabetes, or obesity. What are the signs or symptoms? Symptoms of a migraine vary for each person. The pain may: Be pulsing or throbbing. Happen on one side of the head. In some cases, the pain may be on both sides of the head or around the head or  neck. Be so bad that it prevents you from doing daily activities. Get worse with physical activity. Cause nausea, vomiting, or both. Get worse around bright lights, loud noises, or smells. Cause dizziness. A sign that your migraines are becoming chronic is when you start to have more and more migraine episodes. How is this diagnosed? Chronic migraines are often diagnosed based on: Your symptoms and medical history. A physical exam. You may also have tests, including: A CT scan or an MRI of your brain. These tests can help to rule out other causes of your headaches. Taking fluid from the spine (lumbar puncture) to examine it (cerebrospinal fluid analysis, or CSF analysis). Blood tests. How is this treated? Chronic migraines are treated with medicines that: Lessen pain and nausea. Prevent migraines. Treatment may also include: Acupuncture. Lifestyle changes, such as changes to your diet or sleep schedule. Learning ways to control your body and breathing (biofeedback) and relaxation training. Talk therapy to help you know and deal with negative thoughts (cognitive behavioral therapy). Using a device that provides electrical stimulation to your nerves, which can relieve pain (neuromodulation therapy). Injection of medicine into the muscles of the face or head. Surgery, if the other treatments do not work. Follow these instructions at home: Medicines Take over-the-counter and prescription medicines only as told by your provider. Ask your provider if the medicine prescribed to you requires you to avoid driving or using machinery. Lifestyle  Do not drink alcohol. Do not use any products that contain nicotine or tobacco. These products include cigarettes, chewing tobacco, and vaping devices, such as e-cigarettes. If you need help quitting, ask your provider. Get 7-9  hours of sleep every night, or the amount of sleep recommended by your provider. Find ways to manage stress, such as  meditation, deep breathing, or yoga. Maintain a healthy weight. If you need help losing weight, ask your provider. Exercise regularly. Aim for 150 minutes of moderate-intensity exercise, such as walking, biking, or yoga, or 75 minutes of vigorous exercise each week. Vigorous exercise includes running, circuit training, and swimming. General instructions Keep a journal to find out what triggers your migraines so you can avoid those things. For example, write down: What you eat and drink. How much sleep you get. Any change to your diet or medicines. If you have a migraine headache: Lie down in a dark, quiet room. Try placing a cool towel over your head. Keep lights dim if bright lights bother you or make your migraine worse. Where to find more information Coalition for Headache and Migraine Patients (CHAMP): headachemigraine.org American Migraine Foundation: americanmigrainefoundation.org National Headache Foundation: headaches.org Contact a health care provider if: Your pain does not get better even with medicine. Your migraines keep coming back even with medicine. Get help right away if: Your migraine becomes severe and medicine does not help. You have a fever or stiff neck. You have vision loss. Your muscles feel weak or like you cannot control them. You lose your balance often or have trouble walking. You feel like you may faint or you faint. You start having sudden and unexpected, severe headaches. You have a seizure. This information is not intended to replace advice given to you by your health care provider. Make sure you discuss any questions you have with your health care provider. Document Revised: 08/12/2022 Document Reviewed: 08/12/2022 Elsevier Patient Education  2024 [employer].

## 2024-01-15 ENCOUNTER — Other Ambulatory Visit (HOSPITAL_COMMUNITY): Payer: Self-pay

## 2024-01-15 ENCOUNTER — Telehealth: Payer: Self-pay | Admitting: Nurse Practitioner

## 2024-01-15 NOTE — Telephone Encounter (Signed)
Copied from CRM [id number]. Topic: Clinical - Prescription Issue >> Jan 15, 2024  1:07 PM Fredrich Romans wrote: Reason for CRM: patient called stating that medication  Atogepant (QULIPTA) 30 MG TABS needs a PA.

## 2024-01-16 ENCOUNTER — Other Ambulatory Visit (HOSPITAL_COMMUNITY): Payer: Self-pay

## 2024-01-16 ENCOUNTER — Telehealth: Payer: Self-pay

## 2024-01-16 NOTE — Telephone Encounter (Signed)
Pharmacy Patient Advocate Encounter  Received notification from [facility] that Prior Authorization for Qulipta 30mg  has been APPROVED from 01/16/24 to 07/15/24   PA #/Case ID/Reference #: [id number]   I have notified [facility] of the approval and the said they will have to order the medication and it may be Tuesday before it comes in due to the holiday.   Per test claim, the copay came back as $0.

## 2024-01-16 NOTE — Telephone Encounter (Signed)
Pharmacy Patient Advocate Encounter   Received notification from Pt Calls Messages that prior authorization for Qulipta 30mg  tabs is required/requested.   Insurance verification completed.   The patient is insured through [facility] .   Per test claim: PA required; PA submitted to above mentioned insurance via CoverMyMeds Key/confirmation #/EOC [id number] Status is pending

## 2024-01-28 ENCOUNTER — Encounter (INDEPENDENT_AMBULATORY_CARE_PROVIDER_SITE_OTHER): Payer: Self-pay | Admitting: Family Medicine

## 2024-01-28 ENCOUNTER — Ambulatory Visit (INDEPENDENT_AMBULATORY_CARE_PROVIDER_SITE_OTHER): Payer: Commercial Managed Care - PPO | Admitting: Family Medicine

## 2024-01-28 VITALS — BP 110/71 | HR 72 | Temp 98.1°F | Ht 62.0 in | Wt 189.0 lb

## 2024-01-28 DIAGNOSIS — Z6834 Body mass index (BMI) 34.0-34.9, adult: Secondary | ICD-10-CM

## 2024-01-28 DIAGNOSIS — E559 Vitamin D deficiency, unspecified: Secondary | ICD-10-CM

## 2024-01-28 DIAGNOSIS — Z0289 Encounter for other administrative examinations: Secondary | ICD-10-CM

## 2024-01-28 DIAGNOSIS — E66811 Obesity, class 1: Secondary | ICD-10-CM

## 2024-01-28 DIAGNOSIS — G43709 Chronic migraine without aura, not intractable, without status migrainosus: Secondary | ICD-10-CM

## 2024-01-28 NOTE — Progress Notes (Signed)
Jessica Grippe, DO, ABFM, ABOM Bariatric physician [address] [place], [place], [place] [address] Office: [phone]  /  Fax: [phone]     Initial Evaluation:  Jessica Horton Jessica Horton was seen in clinic today to evaluate for obesity. She is interested in losing weight to improve overall health and reduce the risk of weight related complications. She presents today to review program treatment options, initial physical assessment, and evaluation.      She was referred by: PCP -- [name], NP   When asked how has your weight affected you? She states: Contributed to orthopedic problems or mobility issues, Having fatigue, Having poor endurance, and Has affected mood   Contributing factors to her weight change: Reduced physical activity  Some associated conditions: Arthritis:joint pain and Prediabetes Reviewed pt chart, A1C of 5.7 in preDM range.  Current nutrition plan: Low-carb (no bread or white flour products)  Current level of physical activity: Active lifestyle but no regular exercise.   Current or previous pharmacotherapy: None  Response to medication: Never tried medications   Barriers to weight loss that patient expresses a concern about today: multiple competing priorities, low volume of physical activity at present , and difficulty maintaining a reduced calorie state.   Hopes to accomplish: less back and leg pain and to improve quality of life (improved endurance and less fatigue).    Past Medical History:  Diagnosis Date   Asthma    Asthma    Headache(784.0)    Migraine      Objective:  BP 110/71   Pulse 72   Temp 98.1 F (36.7 C)   Ht 5\' 2"  (1.575 m)   Wt 189 lb (85.7 kg)   LMP 01/16/2024 (Exact Date)   SpO2 100%   BMI 34.57 kg/m  She was weighed on the bioimpedance scale: Body mass index is 34.57 kg/m.  Visceral Fat %: 8 ,   Body Fat %: 41.9  Vitals Temp: 98.1 F (36.7 C) BP: 110/71 Pulse Rate: 72 SpO2: 100 %   Anthropometric  Measurements Height: 5\' 2"  (1.575 m) Weight: 189 lb (85.7 kg) BMI (Calculated): 34.56 Peak Weight: 199 lb   Body Composition  Body Fat %: 41.9 % Fat Mass (lbs): 79.2 lbs Muscle Mass (lbs): 104.4 lbs Total Body Water (lbs): 72.4 lbs Visceral Fat Rating : 8   Other Clinical Data Fasting: yes Labs: no Comments: info session     General: Well Developed, well nourished, and in no acute distress.  HEENT: Normocephalic, atraumatic; EOMI, sclerae are anicteric. Skin: Warm and dry, good turgor Chest:  Normal excursion, shape, no gross ABN Respiratory: No conversational dyspnea; speaking in full sentences NeuroM-Sk:  Normal gross ROM * 4 extremities  Psych: A and O *3, insight adequate, mood- full    Assessment and Plan:   FOR THE DISEASE OF OBESITY: Obesity, Class I, BMI 30-34.9 BMI 34.0-34.9,adult - Current BMI 34.56 Assessment & Plan: We reviewed anthropometrics, biometrics, associated medical conditions and contributing factors with patient. [name] would benefit from a medically tailored reduced calorie nutrional plan based on his REE (resting energy expenditure), which will be determined by indirect calorimetry.  We will also assess for cardiometabolic risk and nutritional derangements via fasting labs at intake appointment.    Obesity Treatment / Action Plan:   She was weighed on the bioimpedance scale and results were discussed and documented in the synopsis.   Delmi Maggie Schwalbe Jessica Alaoui will complete provided nutritional and psychosocial assessment questionnaire before the next appointment.  She  will be scheduled for indirect calorimetry to determine resting energy expenditure in a fasting state.  This will allow [place] to create a reduced calorie, high-protein meal plan to promote loss of fat mass while preserving muscle mass.  We will also assess for cardiometabolic risk and nutritional derangements via an ECG and fasting serologies at her next appointment.  She was  encouraged to work on amassing support from family and friends to begin their weight loss journey.   Work on eliminating or reducing the presence of highly processed, poorly nutritious, calorie-dense foods in the home.   Obesity Education Performed Today:  Patient was counseled on nutritional approaches to weight loss and benefits of reducing processed foods and consuming plant-based foods and high quality protein as part of nutritional weight management program.   We discussed the importance of long term lifestyle changes which include nutrition, exercise and behavioral modifications as well as the importance of customizing this to her specific health and social needs.   We discussed the benefits of reaching a healthier weight to alleviate the symptoms of existing conditions and reduce the risks of the biomechanical, metabolic and psychological effects of obesity.  Was counseled on the health benefits of losing 5%-10% of total body weight.  Was counseled on our cognitive behavorial therapy program, lead by our bariatric psychologist, who focuses on emotional eating and creating positive behavorial change.  Was counseled on bariatric pharmacotherapy and how this may be used as an adjunct in their weight management   [name] appears to be in the action stage of change and states they are ready to start intensive lifestyle modifications and behavioral modifications.  It was recommended that she follow up in the next 1-2 weeks to review the above steps, and to continue with treatment of their chronic disease state of obesity   FOR OTHER CONDITIONS RELATED TO THE DISEASE OF OBESITY: Chronic migraine without aura without status migrainosus, not intractable Assessment & Plan: Pt endorses 1-2 migraines per week and daily headaches. Her condition has just recently been evaluated by her PCP. She is currently on Maxalt 10 mg as needed for abortive therapy and on Qulipta 30 mg once daily for  preventative therapy. She tolerates both medications well with no side effects reported.   Discussed how stress management and weight loss can generally help improve headaches/migraines. Pt advised to continue to follow up with PCP as instructed by them for further evaluation if needed.    Vitamin D deficiency Assessment & Plan: Vitamin D deficiency was found by her OBGYN in December of 2023. She is established with Dr. Ernestina Penna at [facility]. She was started on ERGO once weekly and was compliant for 3 months. Pt stopped taking many months ago due to it no longer being insured. Since stopping ERGO she has not been supplementing with any OTC vitamin D.   Discussed that her vitamin D may improve with weight loss. Should she desire to start the program, we will continue to monitor her condition as it relates to her weight loss journey.    Attestations:   Reviewed by clinician on day of visit: allergies, medications, problem list, medical history, surgical history, family history, social history, and previous encounter notes pertinent to obesity diagnosis.  40 minutes was spent today on this visit including the above counseling, pre-visit chart review, and post-visit documentation.  Over 50% of this time was spent in direct, face-to-face counseling and coordination of care  I, Isabelle Course,  acting as a medical scribe for  Thomasene Lot, DO., have compiled all relevant documentation for today's office visit on behalf of Thomasene Lot, DO, while in the presence of Thomasene Lot, DO.  I have reviewed the above documentation for accuracy and completeness, and I agree with the above. Jessica Horton, D.O.  The 21st Century Cures Act was signed into law in 2016 which includes the topic of electronic health records.  This provides immediate access to information in MyChart.  This includes consultation notes, operative notes, office notes, lab results and pathology reports.  If you have any  questions about what you read please let us know at your next visit so we can discuss your concerns and take corrective action if need be.  We are right here with you!

## 2024-02-12 ENCOUNTER — Other Ambulatory Visit (HOSPITAL_COMMUNITY): Payer: Self-pay

## 2024-02-17 ENCOUNTER — Other Ambulatory Visit: Payer: Self-pay

## 2024-02-17 ENCOUNTER — Encounter: Payer: Self-pay | Admitting: Nurse Practitioner

## 2024-02-17 ENCOUNTER — Other Ambulatory Visit (HOSPITAL_COMMUNITY): Payer: Self-pay

## 2024-02-17 ENCOUNTER — Telehealth (INDEPENDENT_AMBULATORY_CARE_PROVIDER_SITE_OTHER): Payer: Commercial Managed Care - PPO | Admitting: Nurse Practitioner

## 2024-02-17 DIAGNOSIS — G43709 Chronic migraine without aura, not intractable, without status migrainosus: Secondary | ICD-10-CM | POA: Diagnosis not present

## 2024-02-17 MED ORDER — QULIPTA 30 MG PO TABS
2.0000 | ORAL_TABLET | Freq: Every day | ORAL | 5 refills | Status: DC
Start: 2024-02-17 — End: 2024-07-09
  Filled 2024-02-17: qty 60, 30d supply, fill #0

## 2024-02-17 NOTE — Progress Notes (Addendum)
Virtual Visit via Video Note  I connected with Wajiha Versteeg El on 02/17/24 at  1:20 PM EST by a video enabled telemedicine application and verified that I am speaking with the correct person using two identifiers.  Location: Patient:Home Provider: Office Participants: patient and provider  I discussed the limitations of evaluation and management by telemedicine and the availability of in person appointments. I also discussed with the patient that there may be a patient responsible charge related to this service. The patient expressed understanding and agreed to proceed.  [id number] f/up  History of Present Illness:  Migraines No adverse effects with qulipta Reports no migraine in the last 66month reports cluster headache (over left eye and temporal region) which last 3days 1x/week-describes as mild and resolves with rest.  Increase qulipta to 60mg  daily Maintain maxalt, tylenol or NSAIDs prn F/up in 3-57months, sooner if any needed   Observations/Objective: Physical Exam Nursing note reviewed.  Pulmonary:     Effort: Pulmonary effort is normal.  Neurological:     Mental Status: She is alert and oriented to person, place, and time.  Psychiatric:        Mood and Affect: Mood normal.        Behavior: Behavior normal.        Thought Content: Thought content normal.     Assessment and Plan: Maevyn was seen today for migraine.  Diagnoses and all orders for this visit:  Chronic migraine without aura without status migrainosus, not intractable -     Atogepant (QULIPTA) 30 MG TABS; Take 2 tablets (60 mg total) by mouth daily.   Follow Up Instructions: See instructions above   I discussed the assessment and treatment plan with the patient. The patient was provided an opportunity to ask questions and all were answered. The patient agreed with the plan and demonstrated an understanding of the instructions.   The patient was advised to call back or seek an in-person evaluation  if the symptoms worsen or if the condition fails to improve as anticipated.  [name], NP

## 2024-02-17 NOTE — Assessment & Plan Note (Signed)
No adverse effects with qulipta Reports no migraine in the last 9month reports cluster headache (over left eye and temporal region) which last 3days 1x/week-describes as mild and resolves with rest.  Increase qulipta to 60mg  daily Maintain maxalt, tylenol or NSAIDs prn F/up in 3-85months, sooner if any needed

## 2024-02-18 ENCOUNTER — Other Ambulatory Visit (HOSPITAL_COMMUNITY): Payer: Self-pay

## 2024-02-26 ENCOUNTER — Ambulatory Visit (INDEPENDENT_AMBULATORY_CARE_PROVIDER_SITE_OTHER): Payer: Commercial Managed Care - PPO | Admitting: Family Medicine

## 2024-02-26 ENCOUNTER — Encounter (INDEPENDENT_AMBULATORY_CARE_PROVIDER_SITE_OTHER): Payer: Self-pay | Admitting: Family Medicine

## 2024-02-26 ENCOUNTER — Encounter: Payer: Self-pay | Admitting: Nurse Practitioner

## 2024-02-26 VITALS — BP 104/73 | HR 79 | Temp 98.2°F | Ht 62.0 in | Wt 190.0 lb

## 2024-02-26 DIAGNOSIS — E559 Vitamin D deficiency, unspecified: Secondary | ICD-10-CM | POA: Diagnosis not present

## 2024-02-26 DIAGNOSIS — R5383 Other fatigue: Secondary | ICD-10-CM

## 2024-02-26 DIAGNOSIS — J45990 Exercise induced bronchospasm: Secondary | ICD-10-CM

## 2024-02-26 DIAGNOSIS — R7303 Prediabetes: Secondary | ICD-10-CM

## 2024-02-26 DIAGNOSIS — Z1331 Encounter for screening for depression: Secondary | ICD-10-CM | POA: Diagnosis not present

## 2024-02-26 DIAGNOSIS — R0602 Shortness of breath: Secondary | ICD-10-CM

## 2024-02-26 DIAGNOSIS — Z6834 Body mass index (BMI) 34.0-34.9, adult: Secondary | ICD-10-CM | POA: Diagnosis not present

## 2024-02-26 DIAGNOSIS — E66811 Obesity, class 1: Secondary | ICD-10-CM | POA: Diagnosis not present

## 2024-02-26 DIAGNOSIS — G43709 Chronic migraine without aura, not intractable, without status migrainosus: Secondary | ICD-10-CM

## 2024-02-26 NOTE — Progress Notes (Signed)
 Carlye Grippe, D.O.  ABFM, ABOM Specializing in Clinical Bariatric Medicine Office located at: [address] [place], [place]  [address]   Bariatric Medicine Visit  Dear Elease Etienne, Bonna Gains, NP   Thank you for referring Makinna Andy El Alaoui to our clinic today for evaluation.  We performed a consultation to discuss her options for treatment and educate the patient on her disease state.  The following note includes my evaluation and treatment recommendations.   Please do not hesitate to reach out to me directly if you have any further concerns.   Assessment and Plan:   Labs obtained today (CBC, CMP, Folate, Insulin, Lipid panel, T4 free, TSH, Vitamin B12, and Vit D) will be reviewed at her next OV (first f/u).   FOR THE DISEASE OF OBESITY: BMI 34.0-34.9,adult - Current BMI 34.74 Obesity, Class I, -- Starting BMI 34.74 date 02/26/24 Assessment & Plan: Recommended Dietary Goals Samatha is currently in the action stage of change. As such, her goal is to start our weight management plan.  She has agreed to: Follow the Category 1 plan adhering to 1000-1100 calories per day with breakfast and lunch options.   Behavioral Intervention We discussed the following Behavioral Modification Strategies today: increasing lean protein intake to established goals, decreasing simple carbohydrates , increasing vegetables, increasing lower glycemic fruits, increasing fiber rich foods, avoiding skipping meals, keeping healthy foods at home, identifying sources and decreasing liquid calories, decreasing eating out or consumption of processed foods, and making healthy choices when eating convenient foods, and focusing on food with a 10:1 ratio of calories: grams of protein  Additional resources provided today: handout on CAT 1 meal plan , Handout on CAT 1-2 breakfast options, and Handout on CAT 1-2 lunch options  Evidence-based interventions for health behavior change were utilized today including  the discussion of self monitoring techniques, problem-solving barriers and SMART goal setting techniques.    Pt will specifically work on: Work on implementation of her meal plan for next visit.    Recommended Physical Activity Goals Sarika has been advised to work up to 150 minutes of moderate intensity aerobic activity a week and strengthening exercises 2-3 times per week for cardiovascular health, weight loss maintenance and preservation of muscle mass.   She has agreed to : maintain current level of activity.    Pharmacotherapy We discussed various medication options to help Marty with her weight loss efforts and we both agreed to:  Start with nutrition and behavioral strategies.   FOR ASSOCIATED CONDITIONS ADDRESSED TODAY: Fatigue Assessment & Plan: Armenta does feel that her weight is causing her energy to be lower than it should be. Fatigue may be related to obesity, depression or many other causes. she does not appear to have any red flag symptoms and this appears to most likely be related to her current lifestyle habits and dietary intake. Labs will be ordered and reviewed with her at their next office visit in two weeks.  Labs Ordered: - Comprehensive metabolic panel - CBC with Differential/Platelet - Folate - Insulin, random - Lipid panel - T4, free - TSH - VITAMIN D 25 Hydroxy (Vit-D Deficiency, Fractures) - Vitamin B12   Epworth sleepiness scale is 3 and appears to be within normal limits. Mikiya reports mild daytime somnolence and denies waking up still tired. Patient has a history of symptoms of morning headaches (2+ times per week). Srinika generally gets  6-8  hours of sleep per night, and states that she has generally restful  sleep. Snoring are present. Apneic episodes are not present.   ECG: Performed and reviewed/ interpreted independently.  Normal sinus rhythm, rate 46 bpm; reassuring without any acute abnormalities, will continue to monitor for symptoms    Shortness of breath on exertion Assessment & Plan: Akayla does feel that she gets out of breath more easily than she used to when she exercises and seems to be worsening over time with weight gain.  This has not gotten worse recently. Sallie denies shortness of breath at rest or orthopnea. Jannifer's shortness of breath appears to be obesity related and exercise induced, as they do not appear to have any "red flag" symptoms/ concerns today.  Also, this condition appears to be related to a state of poor cardiovascular conditioning   Obtain labs today and will be reviewed with her at their next office visit in two weeks.  Indirect Calorimeter completed today to help guide our dietary regimen. It shows a VO2 of 216 and a REE of 1483.  Her calculated basal metabolic rate is 8295 thus her resting energy expenditure is slightly worse than expected.  Patient agreed to work on weight loss at this time.  As Tahnee progresses through our weight loss program, we will gradually increase exercise as tolerated to treat her current condition.   If Kierston follows our recommendations and loses 5-10% of their weight without improvement of her shortness of breath or if at any time, symptoms become more concerning, they agree to urgently follow up with their PCP/ specialist for further consideration/ evaluation.   Jacquelina verbalizes agreement with this plan.   Exercise induced bronchospasm Assessment & Plan: Pt reports hx of asthma and exercised induced bronchospasms. Condition is well controlled. Condition only worsens to the point she needs her albuterol inhaler when she has a cold or with over exertion when exercising. She does not rely on her inhaler while going on her daily walks.   Continue with current regimen. Encouraged pt to follow up with her PCP as instructed by them for management of condition. Will continue to monitor condition alongside PCP as it relates to her weight loss journey.   Depression Screen   Assessment & Plan: Her Food and Mood (modified PHQ-9) score was 11. She denies any emotional eating. Moods are currently stable. No SI/HI. No cravings or hunger reported.   Vitamin D deficiency Assessment & Plan: Not currently taking any vitamin D supplements. Labs were drawn with her OBGYN; we are unable to view on pt's EHR. She was previously on vitamin D supplementation for about 3 months in the past. Supplementation was not discontinued for medical reasons; she adds she "didn't get it anymore."   Ideal vitamin D levels reviewed with patient. Discussed the importance of optimal vitamin D levels as it relates to overall health and energy as well as weight loss. I reviewed possible symptoms of low Vitamin D:  low energy, depressed mood, muscle aches, joint aches, osteoporosis etc. with patient. Vitamin D levels will be checked today and reviewed at her next OV. All questions and concerns regarding this condition were addressed.   Chronic migraine without aura without status migrainosus, not intractable Assessment & Plan: Migraines are overall well controlled on Qulipta 60 mg once daily and Maxalt 10 mg as needed for abortive therapy; both managed by her PCP. Tolerating both medications well, no adverse side effects reported today. Pt is established with neurology. She does not skip meals or she will get a migraine, however, when she gets a severe  migraine she is unable to eat d/t nausea.   Continue with current medication regimen per PCP. Encouraged pt to follow up with PCP as instructed by them to continued management of condition. Will monitor condition alongside PCP.   Prediabetes Assessment & Plan: Lab Results  Component Value Date   HGBA1C 5.7 (A) 01/14/2024   This is a new diagnosis for [facility]; was recently diagnosed with prediabetes at her last appointment with her PCP - [name], NP. As of 01/14/24 her A1C was 5.7. She does report a family history of diabetes in  mother and siblings. Not currently treating with any medications. Diet/exercise approach.   I counseled patient on pathophysiology of the disease process of prediabetes. Stressed importance of dietary and lifestyle modifications to result in weight loss as first line txmnt. Start implementation of her prudent nutritional meal plan by to decreasing simple carbs/sugars and increasing intake of fiber and proteins. Explained role of simple carbs and insulin levels on hunger and cravings. Abbie will continue to work on weight loss, exercise, via their meal plan we devised to help decrease the risk of progressing to diabetes. We will recheck A1c and fasting insulin level in approximately 3-4 months from last check, or as deemed appropriate.   FOLLOW UP:   Follow up in 2 weeks. She was informed of the importance of frequent follow up visits to maximize her success with intensive lifestyle modifications for her multiple health conditions.  Shonda Maggie Schwalbe El Alaoui is aware that we will review all of her lab results at our next visit.  She is aware that if anything is critical/ life threatening with the results, we will be contacting her via MyChart prior to the office visit to discuss management.    Chief Complaint:   OBESITY Macall Mccroskey (MR# [id number]) is a pleasant 33 y.o. female who presents for evaluation and treatment of obesity and related comorbidities. Current BMI is Body mass index is 34.75 kg/m. Okla Maggie Schwalbe El Alaoui has been struggling with her weight for many years and has been unsuccessful in either losing weight, maintaining weight loss, or reaching her healthy weight goal.  Brooke Dare El Alaoui is currently in the action stage of change and ready to dedicate time achieving and maintaining a healthier weight. Mikalyn Maggie Schwalbe El Alaoui is interested in becoming our patient and working on intensive lifestyle modifications including (but not limited to) diet and exercise for weight  loss.  Tuana Maggie Schwalbe El Alaoui works as a Scientist, forensic; working 40 hours per week. Patient is married and has 1 child. She lives with her husband and son (Jad - 65 y/o).  Walks for about 20-30 minutes daily.  Desired weight is 140-150 lbs, in unspecified time.  Gained weight in 2015 after childbirth.  Peak weight was 199 lbs.  Past diets: Keto diet -- lost about 5-6 lbs but gained all the weight back.  Eating outside the home 3-5 times per week.  Eating fast food and take out 5-6 times.  Craves savory foods.  Tends to snacks between meals and sometimes after dinner.  Snacks on leftovers and fruits.   Never skips meals because she may get a headache if she does.  Caloric beverages include: Fruit smoothies, coffee with creamer and milk, milk, and tea.  Uses agave and organic raw brown sugar as her sugar substitutes.  Worst food habits: "None"  Subjective:   This is the patient's first visit at [facility].  The patient's NEW PATIENT PACKET that they filled out prior to today's office visit was reviewed at length and information from that paperwork was included within the following office visit note.    Included in the packet: current and past health history, medications, allergies, ROS, gynecologic history (women only), surgical history, family history, social history, weight history, weight loss surgery history (for those that have had weight loss surgery), nutritional evaluation, mood and food questionnaire along with a depression screening (PHQ9) on all patients, an Epworth questionnaire, sleep habits questionnaire, patient life and health improvement goals questionnaire. These will all be scanned into the patient's chart under the "media" tab.   Review of Systems: Please refer to new patient packet scanned into media. Pertinent positives were addressed with patient today.  Reviewed by clinician on day of visit: allergies, medications, problem  list, medical history, surgical history, family history, social history, and previous encounter notes.  During the visit, I independently reviewed the patient's EKG, bioimpedance scale results, and indirect calorimeter results. I used this information to tailor a meal plan for the patient that will help Brooke Dare El Alaoui to lose weight and will improve her obesity-related conditions going forward.  I performed a medically necessary appropriate examination and/or evaluation. I discussed the assessment and treatment plan with the patient. The patient was provided an opportunity to ask questions and all were answered. The patient agreed with the plan and demonstrated an understanding of the instructions. Labs were ordered today (unless patient declined them) and will be reviewed with the patient at our next visit unless more critical results need to be addressed immediately. Clinical information was updated and documented in the EMR.    Objective:   PHYSICAL EXAM: Blood pressure 104/73, pulse 79, temperature 98.2 F (36.8 C), height 5\' 2"  (1.575 m), weight 190 lb (86.2 kg), last menstrual period 01/16/2024, SpO2 100%. Body mass index is 34.75 kg/m.  General: Well Developed, well nourished, and in no acute distress.  HEENT: Normocephalic, atraumatic; EOMI, sclerae are anicteric. Skin: Warm and dry, good turgor Chest:  Normal excursion, shape, no gross ABN Respiratory: No conversational dyspnea; speaking in full sentences NeuroM-Sk:  Normal gross ROM * 4 extremities  Psych: A and O *3, insight adequate, mood- full   Anthropometric Measurements Height: 5\' 2"  (1.575 m) Weight: 190 lb (86.2 kg) BMI (Calculated): 34.74 Weight at Last Visit: n/a Weight Lost Since Last Visit: n/a Weight Gained Since Last Visit: n/a Starting Weight: n/a Peak Weight: 199 lb Waist Measurement : 40 inches   Body Composition  Body Fat %: 42.6 % Fat Mass (lbs): 81 lbs Muscle Mass (lbs): 103.6 lbs Total Body  Water (lbs): 73.8 lbs Visceral Fat Rating : 9   Other Clinical Data Fasting: yes Labs: yes Today's Visit #: 1 Starting Date: 02/26/24    DIAGNOSTIC DATA REVIEWED:  BMET    Component Value Date/Time   NA 136 08/13/2023 0856   K 4.3 08/13/2023 0856   CL 102 08/13/2023 0856   CO2 28 08/13/2023 0856   GLUCOSE 93 08/13/2023 0856   BUN 8 08/13/2023 0856   CREATININE 0.53 08/13/2023 0856   CREATININE 0.57 12/27/2021 0826   CALCIUM 9.6 08/13/2023 0856   GFRNONAA >60 07/04/2018 0131   GFRAA >60 07/04/2018 0131   Lab Results  Component Value Date   HGBA1C 5.7 (A) 01/14/2024   No results found for: "INSULIN" Lab Results  Component Value Date   TSH 1.68 12/27/2021   CBC    Component Value  Date/Time   WBC 6.8 12/27/2021 0826   RBC 4.33 12/27/2021 0826   HGB 12.8 12/27/2021 0826   HCT 38.8 12/27/2021 0826   PLT 267 12/27/2021 0826   MCV 89.6 12/27/2021 0826   MCH 29.6 12/27/2021 0826   MCHC 33.0 12/27/2021 0826   RDW 11.8 12/27/2021 0826   Iron Studies No results found for: "IRON", "TIBC", "FERRITIN", "IRONPCTSAT" Lipid Panel     Component Value Date/Time   CHOL 163 12/27/2021 0826   TRIG 54 12/27/2021 0826   HDL 55 12/27/2021 0826   CHOLHDL 3.0 12/27/2021 0826   LDLCALC 94 12/27/2021 0826   Hepatic Function Panel     Component Value Date/Time   PROT 7.1 08/13/2023 0856   ALBUMIN 4.3 08/13/2023 0856   AST 16 08/13/2023 0856   ALT 18 08/13/2023 0856   ALKPHOS 77 08/13/2023 0856   BILITOT 0.8 08/13/2023 0856   BILIDIR 0.1 07/04/2018 0131   IBILI 0.5 07/04/2018 0131      Component Value Date/Time   TSH 1.68 12/27/2021 0826   Nutritional No results found for: "VD25OH"  Attestation Statements:   I, Isabelle Course, acting as a medical scribe for Thomasene Lot, DO., have compiled all relevant documentation for today's office visit on behalf of Thomasene Lot, DO, while in the presence of [employer], DO.  Reviewed by clinician on day of visit:  allergies, medications, problem list, medical history, surgical history, family history, social history, and previous encounter notes pertinent to patient's obesity diagnosis.  I have spent 51 minutes in the care of the patient today including: preparing to see patient (e.g. review and interpretation of tests, old notes ), obtaining and/or reviewing separately obtained history, performing a medically appropriate examination or evaluation, counseling and educating the patient, ordering medications, test or procedures, documenting clinical information in the electronic or other health care record, and independently interpreting results and communicating results to the patient, family, or caregiver.  I have reviewed the above documentation for accuracy and completeness, and I agree with the above. Carlye Grippe, D.O.  The 21st Century Cures Act was signed into law in 2016 which includes the topic of electronic health records.  This provides immediate access to information in MyChart.  This includes consultation notes, operative notes, office notes, lab results and pathology reports.  If you have any questions about what you read please let us know at your next visit so we can discuss your concerns and take corrective action if need be.  We are right here with you.

## 2024-02-27 DIAGNOSIS — Z3689 Encounter for other specified antenatal screening: Secondary | ICD-10-CM | POA: Diagnosis not present

## 2024-02-27 DIAGNOSIS — Z3200 Encounter for pregnancy test, result unknown: Secondary | ICD-10-CM | POA: Diagnosis not present

## 2024-02-27 DIAGNOSIS — O0901 Supervision of pregnancy with history of infertility, first trimester: Secondary | ICD-10-CM | POA: Diagnosis not present

## 2024-02-27 LAB — BETA HCG QUANT (REF LAB)

## 2024-02-28 LAB — LIPID PANEL
Chol/HDL Ratio: 2.8 ratio (ref 0.0–4.4)
Cholesterol, Total: 152 mg/dL (ref 100–199)
HDL: 55 mg/dL (ref >39)
LDL Chol Calc (NIH): 85 mg/dL (ref 0–99)
Triglycerides: 56 mg/dL (ref 0–149)
VLDL Cholesterol Cal: 12 mg/dL (ref 5–40)

## 2024-02-28 LAB — CBC WITH DIFFERENTIAL/PLATELET
Basophils Absolute: 0 10*3/uL (ref 0.0–0.2)
Basos: 1 %
EOS (ABSOLUTE): 0.2 10*3/uL (ref 0.0–0.4)
Eos: 3 %
Hematocrit: 39.4 % (ref 34.0–46.6)
Hemoglobin: 12.8 g/dL (ref 11.1–15.9)
Immature Grans (Abs): 0 10*3/uL (ref 0.0–0.1)
Immature Granulocytes: 0 %
Lymphocytes Absolute: 2 10*3/uL (ref 0.7–3.1)
Lymphs: 27 %
MCH: 29.1 pg (ref 26.6–33.0)
MCHC: 32.5 g/dL (ref 31.5–35.7)
MCV: 90 fL (ref 79–97)
Monocytes Absolute: 0.4 10*3/uL (ref 0.1–0.9)
Monocytes: 6 %
Neutrophils Absolute: 4.7 10*3/uL (ref 1.4–7.0)
Neutrophils: 63 %
Platelets: 274 10*3/uL (ref 150–450)
RBC: 4.4 x10E6/uL (ref 3.77–5.28)
RDW: 12.2 % (ref 11.7–15.4)
WBC: 7.3 10*3/uL (ref 3.4–10.8)

## 2024-02-28 LAB — COMPREHENSIVE METABOLIC PANEL
ALT: 15 IU/L (ref 0–32)
AST: 15 IU/L (ref 0–40)
Albumin: 4.5 g/dL (ref 4.0–5.0)
Alkaline Phosphatase: 91 IU/L (ref 44–121)
BUN/Creatinine Ratio: 19 (ref 9–23)
BUN: 9 mg/dL (ref 6–20)
Bilirubin Total: 0.7 mg/dL (ref 0.0–1.2)
CO2: 20 mmol/L (ref 20–29)
Calcium: 9.4 mg/dL (ref 8.7–10.2)
Chloride: 101 mmol/L (ref 96–106)
Creatinine, Ser: 0.47 mg/dL — ABNORMAL LOW (ref 0.57–1.00)
Globulin, Total: 2.8 g/dL (ref 1.5–4.5)
Glucose: 85 mg/dL (ref 70–99)
Potassium: 4.2 mmol/L (ref 3.5–5.2)
Sodium: 135 mmol/L (ref 134–144)
Total Protein: 7.3 g/dL (ref 6.0–8.5)
eGFR: 131 mL/min/{1.73_m2} (ref >59)

## 2024-02-28 LAB — TSH: TSH: 0.828 u[IU]/mL (ref 0.450–4.500)

## 2024-02-28 LAB — FOLATE: Folate: 8.5 ng/mL (ref >3.0)

## 2024-02-28 LAB — INSULIN, RANDOM: INSULIN: 17.1 u[IU]/mL (ref 2.6–24.9)

## 2024-02-28 LAB — VITAMIN B12: Vitamin B-12: 560 pg/mL (ref 232–1245)

## 2024-02-28 LAB — T4, FREE: Free T4: 1.23 ng/dL (ref 0.82–1.77)

## 2024-02-28 LAB — VITAMIN D 25 HYDROXY (VIT D DEFICIENCY, FRACTURES): Vit D, 25-Hydroxy: 19.2 ng/mL — ABNORMAL LOW (ref 30.0–100.0)

## 2024-03-05 DIAGNOSIS — Z3201 Encounter for pregnancy test, result positive: Secondary | ICD-10-CM | POA: Diagnosis not present

## 2024-03-11 ENCOUNTER — Ambulatory Visit (INDEPENDENT_AMBULATORY_CARE_PROVIDER_SITE_OTHER): Payer: Commercial Managed Care - PPO | Admitting: Family Medicine

## 2024-03-18 ENCOUNTER — Encounter: Payer: Self-pay | Admitting: *Deleted

## 2024-03-24 ENCOUNTER — Encounter: Payer: Self-pay | Admitting: *Deleted

## 2024-03-29 ENCOUNTER — Ambulatory Visit (INDEPENDENT_AMBULATORY_CARE_PROVIDER_SITE_OTHER): Payer: Commercial Managed Care - PPO | Admitting: Family Medicine

## 2024-04-08 ENCOUNTER — Other Ambulatory Visit (HOSPITAL_COMMUNITY): Payer: Self-pay

## 2024-04-08 ENCOUNTER — Telehealth

## 2024-04-08 DIAGNOSIS — Z3491 Encounter for supervision of normal pregnancy, unspecified, first trimester: Secondary | ICD-10-CM

## 2024-04-08 DIAGNOSIS — O0990 Supervision of high risk pregnancy, unspecified, unspecified trimester: Secondary | ICD-10-CM | POA: Insufficient documentation

## 2024-04-08 DIAGNOSIS — Z3A11 11 weeks gestation of pregnancy: Secondary | ICD-10-CM

## 2024-04-08 DIAGNOSIS — Z3490 Encounter for supervision of normal pregnancy, unspecified, unspecified trimester: Secondary | ICD-10-CM | POA: Insufficient documentation

## 2024-04-08 MED ORDER — PROMETHAZINE HCL 25 MG PO TABS
25.0000 mg | ORAL_TABLET | Freq: Four times a day (QID) | ORAL | 0 refills | Status: DC | PRN
  Filled 2024-04-08: qty 30, 8d supply, fill #0

## 2024-04-08 NOTE — Patient Instructions (Signed)
 Safe Medications in Pregnancy   Acne:  Benzoyl Peroxide  Salicylic Acid   Backache/Headache:  Tylenol: 2 regular strength every 4 hours OR               2 Extra strength every 6 hours   Colds/Coughs/Allergies:  Benadryl (alcohol free) 25 mg every 6 hours as needed  Breath right strips  Claritin  Cepacol throat lozenges  Chloraseptic throat spray  Cold-Eeze- up to three times per day  Cough drops, alcohol free  Flonase (by prescription only)  Guaifenesin  Mucinex  Robitussin DM (plain only, alcohol free)  Saline nasal spray/drops  Sudafed (pseudoephedrine) & Actifed * use only after [redacted] weeks gestation and if you do not have high blood pressure  Tylenol  Vicks Vaporub  Zinc lozenges  Zyrtec   Constipation:  Colace  Ducolax suppositories  Fleet enema  Glycerin suppositories  Metamucil  Milk of magnesia  Miralax  Senokot  Smooth move tea   Diarrhea:  Kaopectate  Imodium A-D   *NO pepto Bismol   Hemorrhoids:  Anusol  Anusol HC  Preparation H  Tucks   Indigestion:  Tums  Maalox  Mylanta  Zantac  Pepcid   Insomnia:  Benadryl (alcohol free) 25mg  every 6 hours as needed  Tylenol PM  Unisom, no Gelcaps   Leg Cramps:  Tums  MagGel   Nausea/Vomiting:  Bonine  Dramamine  Emetrol  Ginger extract  Sea bands  Meclizine  Nausea medication to take during pregnancy:  Unisom (doxylamine succinate 25 mg tablets) Take one tablet daily at bedtime. If symptoms are not adequately controlled, the dose can be increased to a maximum recommended dose of two tablets daily (1/2 tablet in the morning, 1/2 tablet mid-afternoon and one at bedtime).  Vitamin B6 100mg  tablets. Take one tablet twice a day (up to 200 mg per day).   Skin Rashes:  Aveeno products  Benadryl cream or 25mg  every 6 hours as needed  Calamine Lotion  1% cortisone cream   Yeast infection:  Gyne-lotrimin 7  Monistat 7    **If taking multiple medications, please check labels to avoid  duplicating the same active ingredients  **take medication as directed on the label  ** Do not exceed 4000 mg of tylenol in 24 hours  **Do not take medications that contain aspirin or ibuprofen            [facility] Pediatric Providers  [place] ([address]) [facility] Manson Passey, MD; Deirdre Priest, MD; Lum Babe, MD; Leveda Anna, MD; McDiarmid, MD; Jerene Bears, MD [address] [place] [place], [place] [address] [phone] Mon-Fri 8:30-12:30, 1:30-5:00  Providers come to see babies during newborn hospitalization Only accepting infants of Mother's who are seen at [facility] or have siblings seen at   [facility] Medicaid - Yes; Tricare - Yes   [facility], MD [address] [place], [place] [address] [phone] Mon, Tue, Thur, Fri 8:30-5:00, Wed 10:00-7:00 (closed 1-2pm daily for lunch) [facility] residents with no insurance.  [place] [employer] only with Medicaid/insurance; Tricare - no  [facility]) - Tim and [facility], MD; Manson Passey, MD; Ave Filter, MD; Luna Fuse, MD; Kennedy Bucker, MD; Florestine Avers, MD; Melchor Amour, MD; Yetta Barre,  MD; Konrad Dolores, MD; Kathlene November, MD; Jenne Campus, MD; Wynetta Emery, MD; Duffy Rhody, MD; Gerre Couch, NP [address] [place], [place], [place] [address] [phone] Mon, Tue, Thur, Fri 8:30-5:30, Wed 9:30-5:30, Sat 8:30-12:30 Only accepting infants of first-time parents or siblings of current  patients Hospital discharge coordinator will make follow-up appointment Medicaid - yes; Tricare - yes  [place] ([address]) [facility], MD; Earlene Plater, MD; Jamesetta Orleans, MD; Alvera Novel, MD; Rana Snare, MD; [facility], MD; De Graff, MD; Hosie Poisson, MD; Mayford Knife, MD [address], [place], [place] [address] [phone] Mon-Fri 8:30-5:00, closed for lunch 12:30-1:30; Sat-Sun 10:00-1:00 Accepting Newborns with commercial insurance only, must call prior to  delivery to be accepted into  practice.  Medicaid - no, Tricare - yes   [facility] [address] [place], [place] [address] [phone] or [phone] Mon to Fri 8am to 10pm, Sat 8am to 1pm (virtual only on weekends) Only accepts Medicaid Healthy Blue pts  [facility] ([facility]) - Pediatrics at Elige Radon, MD; Sabino Dick, MD; Quitman Livings, MD; Betha Loa, NP; Claretha Cooper, MD; Lelon Perla, MD [address] [place] [place], [place] [address] [phone] Mon-Fri 8:30-5:30 Medicaid - yes, Tricare - yes  [place] [phone]) [facility] Mowers, MD [address], [place], [place] [address] [phone] Iona Hansen, Wed Fri 8:30-5:00, Sat 8:30-12:00, Closed Thursdays Accepting siblings of established patients and first time mom's if you call prenatally Medicaid- yes; Tricare - yes  [facility] Lutricia Feil, [place]; Tracie Harrier, MD; Rusty Aus; Scifres, PA; Wynelle Link, MD; Azucena Cecil, MD;  [address] [place], [place] [address] [phone] Mon-Fri 8:30-5:00, closed for lunch 1-2 Only accepting newborns of established patients Medicaid- no; Tricare - yes  [facility] [phone]) [facility] Morene Crocker, MD; [address], [place], [place] [address] [phone] Mon-Fri 8:00-5:00 Medicaid - No; Tricare - Yes  [facility] at [facility], [place]; [place], [place] [address], [place], [place] [address] [phone] Mon-Fri 8:00-5:00 Medicaid - No, Tricare - Yes  [facility] Cardell Peach, MD; Nash Dimmer, MD; [place], [place] [address] [place], [place] [address] [phone]  Mon-Fri 8:00-5:00 Medicaid - No; Tricare - Yes  [facility] [address], [place], [place] [address] [phone] Mon-Fri 8:30-5:00 (lunch 12:00-1:00) Medicaid -Yes; Tricare - Yes  [facility], MD [address] [place],  [place], [place] [address] [phone] Mon-Fri 8:00-5:00 Seeing newborns of current patients only. No new patients Medicaid - No, Tricare - yes  Nature conservation officer at [place] [address] MD [address] [place], [place] [address] [phone] Mon-Fri 8:00-5:00 Medicaid -yes as secondary coverage only; Tricare - yes  [facility] [place], [place]; [place], [place]; Avis Epley, MD; Vonna Kotyk, MD; Clance Boll, MD; [place], [place]; [place], NP; Vaughan Basta, MD; [place], MD [address] [place], [place] [address] [phone] Mon-Fri 8:30-5:00, Sat 9:00-11:00 Accepts commercial insurance ONLY. Offers free prenatal information sessions for families. Medicaid - No, Tricare - Call first  [facility] [place], MD; [place], [place]; [place], [place]; [place], [place] [address] [place] [address] [phone] Mon-Fri 7:30-5:30 Medicaid - Yes; Ailene Rud yes  [place] [phone] & [phone])  [facility], MD [address] [place], [place] [address] [phone] Mon-Thur 8:00-6:00, closed for lunch 12-2, closed Fridays Medicaid - yes; Tricare - no  [facility] Dareen Piano, NP; Cyndia Bent, MD; [place], [place]; [place], [place] [address], [place], [place] [address] [phone] Mon-Fri 7:30-4:30 Medicaid - yes, Tricare - yes  [facility]  Juanito Doom, MD; Janene Harvey, NP; Vonita Moss, MD; Donn Pierini, NP [address], [place], [place] [address] [phone] Mon-Fri 8:30-5:00, closed for lunch 1-2, Sat 8:30-12:00 - sick visits only Providers come to see  babies at [facility] Only accepting newborns of siblings and first time parents ONLY if who have met with office prior to delivery Medicaid -Yes; Tricare - yes  [facility] - [place], [place]; Spero Geralds, NP; Earlene Plater, MD; Lucretia Roers, MD:  [address], [place], [place] [address] [phone] Mon- Fri 8:00-5:00, Sat 9:00-12:00 - sick  visits only Accepting siblings of established patients and first time mom/baby Medicaid - Yes; Tricare - yes Patients must have vaccinations (baby vaccines)  [place] [phone] & [phone])  Adult nurse HealthCare at [facility] [address] [place], [place] [address] [phone] Mon-Fri 8:00-5:00 Medicaid - no; Tricare - yes  [facility] [place], MD; [place], [place]; [place], [place] [address], [place], [place] [address] [phone] Mon-Fri 8:00-5:00 Medicaid- yes; Tricare - yes  [facility] - Ardeen Jourdain, MD; Yetta Barre, NP; [place], [place] [address] [place], [place], [place] [address] [phone] Mon-Fri 8:00-5:00 Medicaid - Yes; Tricare - yes  [address] [place] [phone])  [facility] [place], [place]; [place], [place]; Eddie Candle, MD; Normand Sloop, MD; [place], NP; [place], DO; [place], [place]; Constance Goltz, MD; Ruthann Cancer, MD; Vear Clock, MD; [place], [place]; [place], [place]; [place], [place] [address] [address] [facility] [address], [place], [place] [address] [phone] Mon-Fri 8:30-5:00, Sat 9:00-12:00 - sick only Please register online [email or web address] then schedule online or call office Medicaid-Yes; Tricare -yes  [facility] - [place]  [place], MD; Romualdo Bolk, MD; Kirby, MD; [place], NP; [place], [place]; Antonietta Barcelona, MD; Mayford Knife, NP; Shelva Majestic, MD [address], [place], [place] [address] [phone] Mon-Fri 8:00-5:30, Sat&Sun by appointment (phones open at 8:30) Medicaid - Yes; Tricare - yes  [place] [phone] & [phone]) [facility] Pediatrics Mariel Aloe; [place], MD; Roger Shelter, MD; Arvilla Market, NP; [place], DO [address], [place], [place] [address] [phone] M-F 8:00 - 5:15, Sat/Sun 9-12 sick visits only Medicaid - No; Tricare - yes  [facility]  [place], PA-C; [place], PA-C; [place], DO; [place], PA-C; [place], PA-C; Roselyn Bering, MD [address] [place], [place] [address] [phone] Mon-Thur 8:00-7:00, Fri 8:00-5:00 Accepting Medicaid for 13 and under only   [facility] - Family Medicine at [place] [facility] [place]) [place], [place]; List, FNP; Berneda Rose, MD; Luther Redo, PA-C; Lavonia Drafts, MD; Kellie Simmering, FNP; Genevie Cheshire, FNP; Evaristo Bury, MD; Berneda Rose, MD [phone] [address] [place], [place] [address] [phone] Mon-Fri 8:30-5:30 Medicaid - Yes; Tricare - yes  [facility] - [address]  [place], [place]; Whitney Post, MD; Hennie Duos, MD; Wynne Dust, MD; [place], NP [address], [place], [place] [address] [phone] Mon-Thur 8:00-5:30, Fri 8:00-5:00, Sat 9:00-12:00 Medicaid - yes, Tricare - yes  [place] [phone])  [facility], [place]; Lenise Arena, MD; [place], [place] [address], [place], [place] [address] [phone] Mon-Fri 8:00-5:00, closed for lunch 12-1 Medicaid - No; Tricare - yes  Nature conservation officer at [facility], MD [address] [place], [place] [address] [phone] Mon-Fri 8:00-5:00 Medicaid - No; Tricare - yes  [facility], MD; Tami Ribas, MD; Mariam Dollar, MD; Yetta Barre, MD [address], [place], [place] [address] [phone] Mon-Fri 8:00-5:00 Medicaid- Yes; Tricare - yes  [place] [phone])  Adult nurse HealthCare at [name], [place]; [place], MD [address] [place], [place], [place] [address] [phone] Mon-Fri 8:00-5:00 Medicaid - No; Tricare - yes  [facility] - CPNP [address] [place], [place] [address] [phone] Mon-Weds 8:00-6:00, Thurs-Fri 8:00-5:00, Sat 9:00-12:00 Medicaid - yes; Tricare - yes   [facility] Katharina Caper, MD; [place], [place] [address] [place], [place] [address] [phone] Mon-Fri 8:00-5:00 Medicaid - yes; Tricare - yes  [employer]  Pediatric Providers  [facility] [address], [place], [place] [address] [phone] Sheral Flow: 8am -8pm, Tues, Weds: 8am - 5pm; Fri: 8-1 Medicaid - Yes; Tricare - yes  [facility] Rachel Bo, MD; Laural Benes, MD; Anner Crete, MD; [place], [place]; [place], [place] [address], [place], [place] [address] [phone] M-F 8:30 - 5:00 Medicaid - Call office; Tricare -yes  [facility] Edson Snowball, MD; Shanon Rosser, MD, Chelsea Primus, MD; Shirlyn Goltz, PNP; Wardell Heath, NP [phone] [address], [place], [place] [address] [phone] M-F 8:30 - 5:00, Sat/Sun 8:30 - 12:30 (sick visits) Medicaid - Call office; Tricare -yes  [facility] Melvyn Neth, MD; Karl Luke, PNP; Princess Bruins, MD; [place], [place]; [place], NP; Cynda Familia [address], [place], [place] [address] [phone] M-F 8:30 - 5:00 Medicaid - Call office; Tricare - yes  [facility] Jesusita Oka, MD; Dierdre Highman, MD; Earnest Conroy, MD; Timothy Lasso, MD; [place], MD [phone] [address] [place], [place] [address] [phone] M-Thur: 8:00 - 5:00; Fri: 8:00 - 4:00 Medicaid - yes; Tricare - yes  [facility] [address] [place], [place] [address] [phone] M-F: 8:30- 5:00, closed for lunch 12:30 - 1:00 Medicaid - yes; Tricare -yes  [facility] [address] [place], [place] [address] [phone] M-F 8:00 - 5:00 Medicaid - yes; Tricare - yes  [facility] [place], DO; [place], DO; [place], NP [address], [place], [place] [address] [phone] M-F 8:00 - 5:00, Closed 12-1 for lunch Medicaid - Call; Tricare - yes  [name] Meredith Mody, MD [address], [place], [place] [address] [phone] M-F: 8:00-5:00, Sat: 8:00 - noon Medicaid - call; Tricare -yes  [facility] Pediatric Providers  [facility] [place] [facility] [place], [place] [address] [place], [place], [place] [address] [phone] M-W: 8:00-5:00, Thur: 8:00 -  7:00, Fri: 8:00 - noon Medicaid - yes; Tricare - yes  [facility] - Quay Burow, FNP [address], [place], [place] [address] [phone] M-F 8:00 - 5:00, Closed for lunch 12-1 Medicaid - yes; Tricare - yes  [facility] Pediatric Providers  [facility] Primary Care at [place], [place], Alinda Money, MD, [place], FNP-C [address], [facility], [address], [place], [place] [address] [phone] M-T 8:00-5:00, Wed-Fri 7:00-6:00 Medicaid - Yes; Tricare -yes  [facility] at [facility], DO; [address] Saner [place], [place] [address] [phone] M-F 8:00 - 5:00, closed for lunch 12-1 Medicaid - Yes; Tricare - yes  [facility] Pediatrics and Internal Medicine  Zachery Dauer, MD; Gladstone Lighter, MD; Collie Siad, MD; Freda Jackson, MD; Rich Number, MD; Darryl Nestle, MD; Melinda Crutch, MD, Audria Nine, MD; Tawanna Cooler, MD; Steffanie Dunn, MD; Byrd Hesselbach, MD; Lucretia Roers, MD [address] [place], [place] [address] [phone] M-F 8:00-5:00 Medicaid - yes; Tricare - yes  [facility] [place], MD (speaks [place] and Hindi) [address] [place], [place] [address] [phone] M-F: 8:30 - 5:00, closed 12:30 - 1 for lunch Medicaid - Yes; Tricare -yes  [facility] Pediatric Providers  [facility] Shanda Bumps, MD; Chanetta Marshall, MD; Laurell Josephs,  MD [address] [place], [place] [address] [phone] M-Th: 8:00 - 5:30, Fri: 8:00 - 12:00 Medicaid - yes; Tricare - yes  [facility] Pediatrics at [facility], NP; Thora Lance, MD; Orrin Brigham, MD [phone] [address] [place], [place] [address] [phone] M-F: 8:00 - 5:00 Medicaid - yes; Tricare - yes  [facility] [place], NP; Orson Slick, NP; Salley Scarlet, NP; Linton Flemings, MD; Mayford Knife, MD, [place], NP, Emelda Fear, MD; Nida Boatman [address], [place], [place] [address] [phone] M-F: 8:30 - 5:30p Medicaid - yes; Tricare - yes Other locations available as well  [facility], MD; Andrey Campanile, MD; Neville Route, PA-C [address], [place], [place] [address] [phone] M-W: 8:00am - 7:00pm, Thurs: 8:00am - 8:00pm; Fri: 8:00am - 5:00pm, closed daily from 12-1 for lunch Medicaid - yes; Tricare - yes  [facility] Pediatric Providers  [facility] at Levin Erp, MD; Aggie Cosier, FNP; Bland Span, MD; Tristan Schroeder, MD; [place], PNP; Alesia Banda; [place], [place]; Julian Reil, MD;  [address], [place], [place] [address] [phone] Judie Petit - Caleen Essex: 8am - 5pm, Sat 9-noon Medicaid - Yes; Tricare -yes  [facility] at Jaclynn Guarneri, MD; Yetta Barre, FNP; Lilian Kapur, MD; Mariam Dollar, MD [address] [place], [place] [phone] M-F 8:00 - 5:00 Medicaid - call; Tricare - yes  [facility]- Cruz Condon, MD; [place], [place]; Delora Fuel, MD; Dareen Piano, MD; Trudee Grip, MD; Kizzie Ide, MD; Zebedee Iba; Birdena Crandall, MD; Hinton Dyer, MD; [place], MD [address], [place], [place] [address] [phone] M-F 8:00am - 5:00pm; Sat. 9:00 - 11:00 Medicaid - yes; Tricare - yes  [facility] at [facility], MD [address] [place], [place] [address] [phone] M-F 8:00 - 5:00 Medicaid - [place] Medicaid only; Tricare - yes  [facility] - Illene Bolus, MD; Earlene Plater, [place]; Kenyon Ana, MD [address], [place], [place] [address] [phone] M-F 8:00 - 5:00 Medicaid - yes; Tricare - yes  [facility] [address] Pediatrics - Lind Covert, MD; Manson Passey, MD, [facility], MD, [place], MD; [place], MD; Katrinka Blazing, MD; [address] [place], [place] [address] [phone] M-F: 8-5 Medicaid - yes; Tricare - yes  [facility] - Henrietta Hoover, [place]; [place], MD; [address] [place], [place] [address] [phone] M-F 8-5 Medicaid - yes; Tricare - yes  [address] [place] Darrol Poke, MD; Tami Ribas, MD; [place], MD; [place], DNP;  [place], PNP [address], [place], [place] [address] [phone] M-F 8-5 Medicaid - yes; Tricare - yes  [facility] Internal [facility] and Pediatrics Delories Heinz, MD; Adrienne Mocha; Ala Bent, MD [address] [place], [place] [address] [phone] M-F 7am - 5 pm Medicaid - call; Tricare - yes  [facility] [place], [place]; Fredia Beets, MD; Roxan Hockey, MD [address] [place], [place] [address] [phone] M-F 8-5 Medicaid - yes; Tricare - yes  [facility] Kae Heller, MD; Sheliah Hatch, MD; Mayford Knife, FNP; Shon Baton, FNP; Tyron Russell, FNP; Ishmael Holter; [facility] - FNP [address] [place], [place] [address] [phone] M-F 8-5 Medicaid- yes; Tricare - yes  [facility] - Betsy Coder, [place] and [place] Guest, MD; Terrial Rhodes, MD; Hulda Humphrey, MD; Roseanne [place], MD; [place], [place]; Ala Dach, MD; Fredia Beets, MD; Dimple Casey, MD [address], [place], [place] [address] [phone] M-F: 8-5, Sat: 9-4, Sun 9-12 Medicaid - yes; Tricare - yes  [facility] [place], PNP; [place], PNP [address] [place],  [place] [address] [phone] M-F 8 - 5, closed 12-1 for lunch Medicaid - yes; Tricare - yes  [facility] Kathyrn Lass, MD; Hal Neer, MD; Dimple Casey, MD; [place], DO [address] [place], [place] [address] [phone] M-F 8- 5:30 Medicaid - yes; Tricare - yes  [facility] - Biagio Quint, MD; Rosalia Hammers, MD; Gwenith Daily, MD [address] [place], [place] [address] [phone] Judie Petit: Nicholas Lose; Tues-Fri: 8-5; Sat: 9-12 Medicaid - yes; Tricare - yes  [facility] - Bobbye Morton, MD; Daphane Shepherd, MD; Chestine Spore, MD; Haskell Riling, MD; Kate Sable, MD [address] [place], [place] [address] [phone] Judie PetitMarland Kitchen Nicholas LoseFrancee Nodal: 8-5; Sat: 8:30-12:30 Medicaid - yes;  Tricare - yes  [facility] Heck[facility]s - Beckey Rutter, MD; [place], [place] [address], [place], [place] [address] [phone] Mon-Fri: 8-5 Medicaid - yes; Tricare - yes  [facility] Pediatrics - [place], CPNP; [place], [place]; Dimple Casey, MD; Alisa Graff, MD; Cephus Shelling, MD; [address], [place], [place] [address] [phone] M-F: 8-5, closed 1-2 for lunch Medicaid - yes; Tricare - yes  [facility]s - [facility] [place], [place]; [place], [place]; Katrinka Blazing, MD; [place], CPNP; [place], [place]; [place], MD; Earlene Plater, MD [address], [place], [place] [address] [phone] M-Thurs: Nicholas Lose; Fri: 8-6; Sat: 9-12; Sun 2-4 Medicaid - yes; Tricare - yes  [facility] Georgeanna Lea, MD; Evette Cristal, MD; Shea Stakes, FNP; Earney Mallet, DO; [address] [place], [place] [address] [phone] M-F: 8-5 Medicaid - yes; Tricare - yes  [facility] Pediatric Providers  [facility] Family Medicine -Collene Mares, MD; [place], NP [address], [place], [place] [address] [phone] M - Fri: 8am - 5pm, closed for lunch 12-1 Medicaid - Yes; Tricare - yes  [facility] Elinor Parkinson, MD; Victory Dakin, MD; [place], DO; Vinocur, MD;Hall, PA; Clent [place], [place]; Orvan Falconer, NP [phone] [address] [place], [place] [address] [phone] M-F 8:00 - 5:00, Sat 8:00 - 11:30 Medicaid - yes; Tricare - yes  [facility] Welton Flakes, MD; [place], MD, [address], MD, [place], MD, [place], MD; [place], NP; [place], [place];  [address] [place], [place] [address] [phone] M-F 8:10am - 5:00pm Medicaid - yes; Tricare - yes  [facility] Eustaquio Boyden, MD; [place], NP [address] [place], [place] [address] [phone] M-F 8:00 - 5:00 Medicaid - [place] Medicaid only; Tricare - yes  [facility] - [facility] [address] [place], MD; [place], NP [address], [place], [place] [address] [phone] M-F 8:00 - 5:00; Closed for lunch 12 - 1:00 Medicaid - yes; Tricare - yes  [facility] Belva Crome, MD; Jonita Albee, FNP [address], [place], [place] [address] [phone] Mon 9-5; Tues/Wed 10-5; Thurs 8:30-5; Fri: 8-12:30 Medicaid - yes; Tricare - yes  [facility] Pediatric Providers  [facility]  [place], MD; [place], [place] [address] [place], [place] [address] [phone] phone [phone] fax M-F 7:15 - 4:30 Medicaid - yes; Tricare - yes  [place] -  Pediatrics Karilyn Cota, MD; [place], DO [address], [place], [place] [address] [phone] M-Fri: 8:30 - 5:00, closed for lunch everyday noon - 1pm Medicaid - Yes; Tricare - yes  [facility] Burdine, MD; Reuel Boom, MD; Dimas Aguas, MD; Neita Carp, MD; [place], [place]; Bonnita Nasuti, [place]; [place], [place]; [place],  PA; [place], [place] [address] [place], [place] [address] [phone] M-Thurs: 7:30am - 7:00pm; Friday 7:30am - 4pm; Sat: 8:00 - 1:00 Medicaid - Yes; Tricare - yes  [place] [facility] Norval Morton, MD; Conni Elliot, MD; Carroll Kinds, MD; [place], DO [address], [place], [place] [address] [phone] M-Thur: 8:00 - 5:00, Fri: 8:00 - Noon Medicaid - yes; Tricare - yes No  [employer]  [place] [facility] [place], MD; Nadine Counts, DO; [place], NP; Daphine Deutscher, NP; Lequita Halt, NP; Ellamae Sia, NP; Reginia Forts, NP; Darlyn Read, MD; [place], [place] [address], [place], [place] [address] [phone] M-F 8:00 - 5:00 Medicaid - yes; Tricare - yes  [facility], FNP-C; [place], FNP-C [address] [place], [place] [address] [phone] M, W, R 8:00-5:00, Tues: 8:00am - 7:00pm; Fri 8:00 - noon Medicaid - Yes; Tricare - yes  [facility], MD [address] [place], [place] [address] [phone]  M-Thurs 8:30-5:30, Fri: 8:30-12:30pm Medicaid - Yes; Tricare - N    Considering Waterbirth? Guide for patients at Center for [facility]) Why consider waterbirth? Gentle birth for babies  Less pain medicine used in labor  May allow for passive descent/less pushing  May reduce perineal tears  More mobility and instinctive maternal position changes  Increased maternal relaxation   Is waterbirth safe? What are the risks of infection, drowning or other complications? Infection:  Very low risk (3.7 % for tub vs 4.8% for bed)  7 in 8000 waterbirths with documented infection  Poorly cleaned equipment most common cause  Slightly lower group B strep transmission rate  Drowning  Maternal:  Very low risk  Related to seizures or fainting  Newborn:  Very low risk. No evidence of increased risk of respiratory problems in multiple large studies  Physiological protection from breathing under water  Avoid underwater birth if there are any fetal complications  Once baby's head is out of the water, keep it out.  Birth complication  Some reports of cord trauma, but risk decreased by bringing baby to surface gradually  No evidence of increased risk of shoulder dystocia. Mothers can usually change positions faster in water than in a bed, possibly aiding the maneuvers to free the shoulder.   There are 2 things you MUST do to have a waterbirth with [facility]: Attend a waterbirth class at [facility] at [facility]   3rd Wednesday of every month from 7-9 pm (virtual during COVID) [employer] at [email or web address] or [email or web address] or by calling [phone] Bring [place] the certificate from the class to your prenatal appointment or send via MyChart Meet with a midwife at 36 weeks* to see if you can still plan a waterbirth and to sign the consent.   *We also recommend that you schedule as many of your prenatal  visits with a midwife as possible.    Helpful information: You may want to bring a bathing suit top to the hospital to wear during labor but this is optional.  All other supplies are provided by the hospital. Please arrive at the hospital with signs of active labor, and do not wait at home until late in labor. It takes 45 min- 1 hour for fetal monitoring, and check in to your room to take place, plus transport and filling of the waterbirth tub.    Things that would prevent you from having a waterbirth: Premature, <37wks  Previous cesarean birth  Presence of thick meconium-stained fluid  Multiple gestation (Twins, triplets, etc.)  Uncontrolled diabetes or gestational diabetes requiring medication  Hypertension diagnosed in pregnancy or preexisting hypertension (gestational hypertension, preeclampsia, or chronic hypertension) Fetal growth restriction (your baby measures less than 10th percentile on ultrasound) Heavy vaginal bleeding  Non-reassuring fetal heart rate  Active infection (MRSA, etc.). Group B Strep is NOT a contraindication for waterbirth.  If your labor has to be induced and induction method requires continuous monitoring of the baby's heart rate  Other risks/issues identified by your obstetrical provider   Please remember that birth is unpredictable. Under certain unforeseeable circumstances your provider may advise against giving birth in the tub. These decisions will be made on a case-by-case basis and with the safety of you and your baby as our highest priority.

## 2024-04-08 NOTE — Progress Notes (Signed)
 New OB Intake  I connected with Brooke Dare El Alaoui  on 04/08/24 at  3:15 PM EDT by MyChart Video Visit and verified that I am speaking with the correct person using two identifiers. Nurse is located at [facility] and pt is located at home.  I discussed the limitations, risks, security and privacy concerns of performing an evaluation and management service by telephone and the availability of in person appointments. I also discussed with the patient that there may be a patient responsible charge related to this service. The patient expressed understanding and agreed to proceed.  I explained I am completing New OB Intake today. We discussed EDD of Not found.. Pt is G2P1001. I reviewed her allergies, medications and Medical/Surgical/OB history.    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 Discussed the nature of our practice with multiple providers including residents and students. Due to the size of the practice, the delivering provider may not be the same as those providing prenatal care.   Patient is interested in water birth. Offered upcoming OB visit with CNM to discuss further.  MyChart/Babyscripts MyChart access verified. I explained pt will have some visits in office and some virtually. Babyscripts instructions given and order placed. Patient verifies receipt of registration text/e-mail. Account successfully created and app downloaded. If patient is a candidate for Optimized scheduling, add to sticky note.   Blood Pressure Cuff/Weight Scale Patient has private insurance; instructed to purchase blood pressure cuff and bring to first prenatal appt. Explained after first prenatal appt pt will check weekly and document in Babyscripts.  Anatomy US Unable to schedule anatomy due to busy line.  Message sent to MFM schedulers to schedule anatomy. Order put in.  Is patient a CenteringPregnancy candidate?  Declined Declined due to Declined to say   Is patient a Mom+Baby Combined Care candidate?  Not a candidate   If accepted, confirm patient does not intend to move from the area for at least 12 months, then notify Mom+Baby staff  Interested in [place]? If yes, send referral and doula dot phrase.   Is patient a candidate for Babyscripts Optimization?   First visit review I reviewed new OB appt with patient. Explained pt will be seen by Clement Sayres, PA at first visit. Discussed Avelina Laine genetic screening with patient. Panorama and Horizon.. Routine prenatal labs is needed at new ob visit.  Last Pap Diagnosis  Date Value Ref Range Status  12/13/2020   Final   - Negative for intraepithelial lesion or malignancy (NILM)    B'Aisha T Alex Mcmanigal, CMA 04/08/2024  3:14 PM

## 2024-04-11 NOTE — Progress Notes (Unsigned)
   PRENATAL VISIT NOTE  Subjective:  Jessica Horton is a 33 y.o. G2P1001 at [redacted]w[redacted]d being seen today for her first prenatal visit for this pregnancy.  She is currently monitored for the following issues for this {Blank single:19197::"high-risk","low-risk"} pregnancy and has Asthma, chronic; Migraines; Obesity; Female pattern hair loss; and Supervision of low-risk pregnancy on their problem list.  Patient reports {sx:14538}.   .  .   . Denies leaking of fluid.   She is planning to {Blank single:19197::"breastfeed","bottle feed"}. Desires *** for contraception.   The following portions of the patient's history were reviewed and updated as appropriate: allergies, current medications, past family history, past medical history, past social history, past surgical history and problem list.   Objective:  There were no vitals filed for this visit.  Fetal Status:           General:  Alert, oriented and cooperative. Patient is in no acute distress.  Skin: Skin is warm and dry. No rash noted.   Cardiovascular: Normal heart rate and rhythm noted  Respiratory: Normal respiratory effort, no problems with respiration noted. Clear to auscultation.   Abdomen: Soft, gravid, appropriate for gestational age. Normal bowel sounds. Non-tender.       Pelvic: {Blank single:19197::"Cervical exam performed","Cervical exam deferred"}       Normal cervical contour, no lesions, no bleeding following pap, normal discharge  Extremities: Normal range of motion.     Mental Status: Normal mood and affect. Normal behavior. Normal judgment and thought content.    Indications for ASA therapy (per uptodate) One of the following: Previous pregnancy with preeclampsia, especially early onset and with an adverse outcome {yes/no:20286} Multifetal gestation {yes/no:20286} Chronic hypertension {yes/no:20286} Type 1 or 2 diabetes mellitus {yes/no:20286} Chronic kidney disease {yes/no:20286} Autoimmune disease  (antiphospholipid syndrome, systemic lupus erythematosus) {yes/no:20286}  Two or more of the following: Nulliparity {yes/no:20286} Obesity (body mass index >30 kg/m2) {yes/no:20286} Family history of preeclampsia in mother or sister {yes/no:20286} Age >=35 years {yes/no:20286} Sociodemographic characteristics (African American race, low socioeconomic level) {yes/no:20286} Personal risk factors (eg, previous pregnancy with low birth weight or small for gestational age infant, previous adverse pregnancy outcome [eg, stillbirth], interval >10 years between pregnancies) {yes/no:20286}  Assessment and Plan:  Pregnancy: G2P1001 at [redacted]w[redacted]d  1. Encounter for supervision of low-risk pregnancy in first trimester (Primary) ***  2. [redacted] weeks gestation of pregnancy ***  {Blank single:19197::"Term","Preterm"} labor/first trimester warning symptoms and general obstetric precautions including but not limited to vaginal bleeding, contractions, leaking of fluid and fetal movement were reviewed in detail with the patient. Please refer to After Visit Summary for other counseling recommendations.   Discussed the normal visit cadence for prenatal care Discussed the nature of our practice with multiple providers including residents and students   No follow-ups on file.  Future Appointments  Date Time Provider Department Center  04/12/2024  2:55 PM Rhylei Mcquaig E, PA-C [facility]  04/19/2024  3:15 PM [facility] US2 [facility]  05/28/2024  8:00 AM [facility] NURSE INTAKE [facility]  06/01/2024  1:00 PM [facility] PROVIDER 1 [facility]  06/01/2024  1:30 PM [facility] US3 [facility]  08/16/2024  8:20 AM Nche, Connye Delaine, NP [facility]    Tinika Bucknam E Cleburn Maiolo, PA-C

## 2024-04-12 ENCOUNTER — Encounter: Payer: Self-pay | Admitting: Physician Assistant

## 2024-04-12 ENCOUNTER — Other Ambulatory Visit: Payer: Self-pay

## 2024-04-12 ENCOUNTER — Other Ambulatory Visit (HOSPITAL_COMMUNITY)
Admission: RE | Admit: 2024-04-12 | Discharge: 2024-04-12 | Disposition: A | Discharge status: Home / Self Care | Source: Ambulatory Visit | Attending: Physician Assistant | Admitting: Physician Assistant

## 2024-04-12 ENCOUNTER — Ambulatory Visit: Payer: Self-pay | Admitting: Physician Assistant

## 2024-04-12 VITALS — BP 114/67 | HR 84 | Wt 201.7 lb

## 2024-04-12 DIAGNOSIS — Z3A12 12 weeks gestation of pregnancy: Secondary | ICD-10-CM

## 2024-04-12 DIAGNOSIS — Z3491 Encounter for supervision of normal pregnancy, unspecified, first trimester: Secondary | ICD-10-CM

## 2024-04-12 DIAGNOSIS — Z31430 Encounter of female for testing for genetic disease carrier status for procreative management: Secondary | ICD-10-CM | POA: Diagnosis not present

## 2024-04-13 LAB — CBC/D/PLT+RPR+RH+ABO+RUBIGG...
Antibody Screen: NEGATIVE
Basophils Absolute: 0 10*3/uL (ref 0.0–0.2)
Basos: 0 %
EOS (ABSOLUTE): 0.3 10*3/uL (ref 0.0–0.4)
Eos: 3 %
HCV Ab: NONREACTIVE
HIV Screen 4th Generation wRfx: NONREACTIVE
Hematocrit: 33.8 % — ABNORMAL LOW (ref 34.0–46.6)
Hemoglobin: 11.3 g/dL (ref 11.1–15.9)
Hepatitis B Surface Ag: NEGATIVE
Immature Grans (Abs): 0 10*3/uL (ref 0.0–0.1)
Immature Granulocytes: 0 %
Lymphocytes Absolute: 2.1 10*3/uL (ref 0.7–3.1)
Lymphs: 24 %
MCH: 29.4 pg (ref 26.6–33.0)
MCHC: 33.4 g/dL (ref 31.5–35.7)
MCV: 88 fL (ref 79–97)
Monocytes Absolute: 0.6 10*3/uL (ref 0.1–0.9)
Monocytes: 7 %
Neutrophils Absolute: 6 10*3/uL (ref 1.4–7.0)
Neutrophils: 66 %
Platelets: 265 10*3/uL (ref 150–450)
RBC: 3.85 x10E6/uL (ref 3.77–5.28)
RDW: 12.4 % (ref 11.7–15.4)
RPR Ser Ql: NONREACTIVE
Rh Factor: POSITIVE
Rubella Antibodies, IGG: 4.29 {index} (ref >0.99)
WBC: 9.1 10*3/uL (ref 3.4–10.8)

## 2024-04-13 LAB — HEMOGLOBIN A1C
Est. average glucose Bld gHb Est-mCnc: 114 mg/dL
Hgb A1c MFr Bld: 5.6 % (ref 4.8–5.6)

## 2024-04-13 LAB — HCV INTERPRETATION

## 2024-04-14 ENCOUNTER — Encounter: Payer: Self-pay | Admitting: Physician Assistant

## 2024-04-14 LAB — GC/CHLAMYDIA PROBE AMP (~~LOC~~) NOT AT ARMC
Chlamydia: NEGATIVE
Comment: NEGATIVE
Comment: NORMAL
Neisseria Gonorrhea: NEGATIVE

## 2024-04-14 LAB — URINE CULTURE, OB REFLEX

## 2024-04-14 LAB — CULTURE, OB URINE

## 2024-04-15 LAB — CYTOLOGY - PAP
Comment: NEGATIVE
Diagnosis: NEGATIVE
High risk HPV: NEGATIVE

## 2024-04-16 ENCOUNTER — Other Ambulatory Visit (HOSPITAL_COMMUNITY): Payer: Self-pay

## 2024-04-16 ENCOUNTER — Other Ambulatory Visit: Payer: Self-pay | Admitting: Physician Assistant

## 2024-04-16 DIAGNOSIS — N3000 Acute cystitis without hematuria: Secondary | ICD-10-CM

## 2024-04-16 MED ORDER — CEPHALEXIN 500 MG PO CAPS
500.0000 mg | ORAL_CAPSULE | Freq: Four times a day (QID) | ORAL | 0 refills | Status: AC
  Filled 2024-04-16: qty 28, 7d supply, fill #0

## 2024-04-16 NOTE — Progress Notes (Signed)
 Patient called, left voicemail. Rx sent.

## 2024-04-19 ENCOUNTER — Encounter: Payer: Self-pay | Admitting: Physician Assistant

## 2024-04-19 ENCOUNTER — Other Ambulatory Visit: Payer: Self-pay

## 2024-04-19 ENCOUNTER — Ambulatory Visit

## 2024-04-19 ENCOUNTER — Telehealth: Payer: Self-pay | Admitting: Family Medicine

## 2024-04-19 DIAGNOSIS — O285 Abnormal chromosomal and genetic finding on antenatal screening of mother: Secondary | ICD-10-CM

## 2024-04-19 DIAGNOSIS — Z3A14 14 weeks gestation of pregnancy: Secondary | ICD-10-CM

## 2024-04-19 DIAGNOSIS — Z3491 Encounter for supervision of normal pregnancy, unspecified, first trimester: Secondary | ICD-10-CM

## 2024-04-19 LAB — PANORAMA PRENATAL TEST FULL PANEL:PANORAMA TEST PLUS 5 ADDITIONAL MICRODELETIONS
22Q11.2 DELETION SYNDROME RESULT TEXT: HIGH — AB
FETAL FRACTION: 6.3

## 2024-04-19 NOTE — Telephone Encounter (Signed)
 Patient would like for someone to call her back regarding her lab results from her new ob appt.

## 2024-04-20 ENCOUNTER — Other Ambulatory Visit (HOSPITAL_COMMUNITY): Payer: Self-pay

## 2024-04-20 LAB — HORIZON CUSTOM: REPORT SUMMARY: NEGATIVE

## 2024-04-20 NOTE — Addendum Note (Signed)
 Addended by: Mettie Roylance M on: 04/20/2024 01:45 PM   Modules accepted: Orders

## 2024-04-20 NOTE — Telephone Encounter (Signed)
 Called pt and pt informed of results.  Pt agreed to 04/28/24 at 1300 for genetic counseling.  Pt also informed that an Rx for Keflex  has been sent to her [facility] pharmacy.  Pt verbalized understanding.   [place]ville Whitefield,RN

## 2024-04-21 ENCOUNTER — Other Ambulatory Visit (HOSPITAL_COMMUNITY): Payer: Self-pay

## 2024-04-28 ENCOUNTER — Ambulatory Visit: Payer: Self-pay | Admitting: Maternal & Fetal Medicine

## 2024-04-28 ENCOUNTER — Other Ambulatory Visit: Payer: Self-pay

## 2024-04-28 ENCOUNTER — Ambulatory Visit: Attending: Maternal & Fetal Medicine | Admitting: Obstetrics and Gynecology

## 2024-04-28 DIAGNOSIS — O285 Abnormal chromosomal and genetic finding on antenatal screening of mother: Secondary | ICD-10-CM

## 2024-04-28 DIAGNOSIS — Z3A14 14 weeks gestation of pregnancy: Secondary | ICD-10-CM | POA: Diagnosis not present

## 2024-04-28 NOTE — Progress Notes (Signed)
 Virtual Visit via Video Note  I connected with Jessica Horton on 04/28/24 at  1:00 PM EDT by a video enabled telemedicine application and verified that I am speaking with the correct person using two identifiers.  Location: Patient: work ([place]) Provider: [facility] [place]  Referring provider: Kirstie Percy, [facility] Length of Consultation: 35 minute virtual visit  Jessica Horton  was referred to [facility] at [facility] for genetic counseling to review prenatal screening and testing options due to results of Panorama screening which showed an increased risk for 22q deletion syndrome in this pregnancy.  The patient was present on this virtual visit alone.  Cell-Free DNA Screen High-Risk for 22q11.2 Deletion Syndrome. Tanaya previously completed cell-free DNA screening (cfDNA) in this pregnancy. The result is high risk for 22q11.2 deletion syndrome. The result is low risk for Trisomy 31, Trisomy 8, Trisomy 83, common sex chromosome aneuploidies, and Triploidy.  22q11.2 deletion syndrome, sometimes referred to as DiGeorge Syndrome, is caused by the deletion of genes on one end of chromosome 22. Common features of this condition include heart defects, cleft palate, characteristic facial features, immune deficiency, and learning disabilities. Approximately 20% of affected individuals are estimated to have autism spectrum disorders. Less commonly, affected individuals have autoimmune disease, growth hormone deficiency, hearing loss, and/or psychiatric illness. Symptoms may vary significantly from person to person, even among relatives, and most individuals with the condition live into adulthood.   Approximately 90% of individuals with 22q11.2 deletion syndrome have a de novo deletion of the 22q11.2 region that occurred for the first time in them. Approximately 10% of individuals inherit the deletion from one parent who also carries the deletion of the 22q11.2  region. Some individuals are not diagnosed with the condition until they have a child that is more severely affected than them. 22q11.2 deletion syndrome is an autosomal dominant condition, meaning once a person has the condition, they have a 50% chance to pass on the chromosome 22 that has the deletion with each pregnancy. However, parents and children with 22q11.2 deletion syndrome can have very different features from one another, and many symptoms cannot be predicted in the prenatal period (not all affected pregnancies will demonstrate signs of the condition on ultrasound).   We reviewed that cfDNA analyzes cell-free DNA originating from the placenta that is found in the maternal blood circulation during pregnancy. This test can provide information regarding the presence or absence of extra fetal DNA for chromosomes 13, 18 and 21 as well as the sex chromosomes. cfDNA can also be used to estimate the risk for a 22q11.2 microdeletion. We discussed that the Positive Predictive Value (PPV) is the probability that a pregnancy with a positive test result is truly affected. In this case, the PPV reported by the laboratory is 50%. Given this high-risk cfDNA result, Jessica Horton was offered amniocentesis for prenatal diagnosis. We reviewed the risks, benefits and limitations of this procedure. She is considering the option of amniocentesis but would prefer to wait until after her anatomy ultrasound, which is scheduled for June 01, 2024. If the current pregnancy was confirmed to have 22q11.2 deletion syndrome we cannot predict which symptoms the child may have or the severity of the symptoms. We reviewed that testing for 22q11.2 deletion syndrome after birth is also available if testing is not desired during pregnancy.    Family history and pregnancy history: We obtained a detailed family history and pregnancy history. This is the second pregnancy for  Jessica Horton and her partner, Jessica Horton. They have a healthy 7 year old son. She  reported no complications or exposure to medications, alcohol, tobacco or recreational drugs in this pregnancy. Her husband and son are both reported to have ADHD, which we reviewed may have genetic factors, though the specific genetic factors are not well understood. She also reported that her mother had 5 second trimester pregnancy losses and required a cerclage in her last pregnancy to carry to term. Jessica Horton has one sister and they are both in good health. Two maternal aunts are also said to have had multiple pregnancy losses, and one baby passed away due to infection soon after birth.  There may be many reasons for recurrent pregnancy loss including maternal health factors, chromosome conditions, or unknown factors. Without more information, an accurate assessment for other family members to have recurrent loss is difficult.  If Jessica Horton's mother's losses were all related to cervical incompetence and she has had no evidence of a similar concern, then we would expect a low chance for similar outcomes. The remainder of the family history was reported to be unremarkable for birth defects, intellectual delays, recurrent pregnancy loss or known chromosome abnormalities.  Carrier screening: Per the ACOG Committee Opinion 691, all women who are considering a pregnancy or are currently pregnant should be offered carrier screening for, at minimum, Cystic Fibrosis (CF), Spinal Muscular Atrophy (SMA), and Hemoglobinopathies. The mode of inheritance, clinical manifestations of these conditions, as well as details about testing were reviewed. A negative result on carrier screening reduces the likelihood of being a carrier, however, does not entirely rule out the possibility. Jessica Horton had prior carrier screening with the [employer] and was not found to be a carrier for CF, SMA or hemoglobinopathies.  See that report for details and residual risk estimates.  Plan of Care: Anatomy ultrasound scheduled for 06/01/2024.   Jessica Horton plans to decide about amniocentesis based upon the results of the ultrasound and is aware that not all babies with 22q deletion would be expected to show differences on ultrasound. Fetal echocardiogram should be scheduled following her anatomy ultrasound due to the risk for cardiac defects in babies with 22q deletions. Consider postnatal genetic testing if amniocentesis is not performed. Patient will be added to the [facility] list.  Ms. Jessica Horton was encouraged to call with questions or concerns.  We can be contacted at [phone].  Jessica Bruch, MS, CGC  In total, 55 minutes were spent on the date of the encounter in service to the patient including preparation, record review, virtual video consultation, documentation and care coordination.   Jessica Horton

## 2024-05-11 ENCOUNTER — Ambulatory Visit: Admitting: Certified Nurse Midwife

## 2024-05-11 ENCOUNTER — Other Ambulatory Visit (HOSPITAL_COMMUNITY): Payer: Self-pay

## 2024-05-11 ENCOUNTER — Other Ambulatory Visit: Payer: Self-pay

## 2024-05-11 VITALS — BP 113/77 | HR 96 | Wt 204.8 lb

## 2024-05-11 DIAGNOSIS — G43E09 Chronic migraine with aura, not intractable, without status migrainosus: Secondary | ICD-10-CM

## 2024-05-11 DIAGNOSIS — Z3492 Encounter for supervision of normal pregnancy, unspecified, second trimester: Secondary | ICD-10-CM | POA: Diagnosis not present

## 2024-05-11 DIAGNOSIS — Z3A16 16 weeks gestation of pregnancy: Secondary | ICD-10-CM

## 2024-05-11 MED ORDER — METOCLOPRAMIDE HCL 10 MG PO TABS
10.0000 mg | ORAL_TABLET | Freq: Four times a day (QID) | ORAL | 6 refills | Status: DC
Start: 2024-05-11 — End: 2024-10-17
  Filled 2024-05-11: qty 30, 8d supply, fill #0
  Filled 2024-10-05: qty 30, 8d supply, fill #1

## 2024-05-11 MED ORDER — BUTALBITAL-APAP-CAFFEINE 50-325-40 MG PO TABS
1.0000 | ORAL_TABLET | Freq: Four times a day (QID) | ORAL | 0 refills | Status: DC | PRN
Start: 2024-05-11 — End: 2024-10-17
  Filled 2024-05-12: qty 30, 8d supply, fill #0

## 2024-05-11 MED ORDER — CYCLOBENZAPRINE HCL 10 MG PO TABS
10.0000 mg | ORAL_TABLET | Freq: Three times a day (TID) | ORAL | 6 refills | Status: DC | PRN
Start: 2024-05-11 — End: 2024-10-17
  Filled 2024-05-11: qty 30, 10d supply, fill #0

## 2024-05-11 NOTE — Progress Notes (Signed)
   PRENATAL VISIT NOTE  Subjective:  Jessica Horton is a 33 y.o. G2P1001 at [redacted]w[redacted]d being seen today for ongoing prenatal care.  She is currently monitored for the following issues for this low-risk pregnancy and has Asthma, chronic; Migraines; Obesity; Female pattern hair loss; and Supervision of low-risk pregnancy on their problem list.  Patient reports everyday migraines.  Contractions: Not present.  .  Movement: Present. Denies leaking of fluid.   The following portions of the patient's history were reviewed and updated as appropriate: allergies, current medications, past family history, past medical history, past social history, past surgical history and problem list.   Objective:   Vitals:   05/11/24 1530  BP: 113/77  Pulse: 96  Weight: 204 lb 12.8 oz (92.9 kg)     Fetal Status: Fetal Heart Rate (bpm): 144   Movement: Present      General: Alert, oriented and cooperative. Patient is in no acute distress.  Skin: Skin is warm and dry. No rash noted.   Cardiovascular: Normal heart rate noted  Respiratory: Normal respiratory effort, no problems with respiration noted  Abdomen: Soft, gravid, appropriate for gestational age.  Pain/Pressure: Absent     Pelvic: Cervical exam deferred        Extremities: Normal range of motion.  Edema: None  Mental Status: Normal mood and affect. Normal behavior. Normal judgment and thought content.   Assessment and Plan:  Pregnancy: G2P1001 at [redacted]w[redacted]d 1. Encounter for supervision of low-risk pregnancy in second trimester (Primary) - Doing well, feeling regular and vigorous fetal movement  2. [redacted] weeks gestation of pregnancy - Doing well other than migraines.   3. Migraine headaches For prevention of migraines in pregnancy: -Magnesium, 400mg  by mouth, once daily -Vitamin B2, 400mg  by mouth, once daily  For treatment of migraines in pregnancy: -take medication at the first sign of the pain of a headache, or the first sign of your  aura -start with 1000mg  Tylenol  (do not exceed 4000mg  of Tylenol  in 24hrs), with or without Reglan  10mg  -if no relief after 1-2hours, can take Flexeril 10mg  -if headache is severe and not relieved by the above, may take Fioricet, 1 tablet, no more than 3 days per month -Fioricet should only be used as a rescue medication, when absolutely necessary -if the above regimen does not resolve your headache at all, please come to [facility] for additional treatment -if you take Fioricet, please be aware that this has Tylenol  in it and will contribute to the 4,000mg  of Tylenol  that you are allowed to take per day  Preterm labor symptoms and general obstetric precautions including but not limited to vaginal bleeding, contractions, leaking of fluid and fetal movement were reviewed in detail with the patient. Please refer to After Visit Summary for other counseling recommendations.   Return in about 4 weeks (around 06/08/2024) for LOB.  Future Appointments  Date Time Provider Department Center  06/01/2024  1:00 PM [facility] PROVIDER 1 [facility]  06/01/2024  1:30 PM [facility]3 [facility]  08/16/2024  8:20 AM Nche, Connye Delaine, NP [facility]    Salomon Cree, CNM

## 2024-05-11 NOTE — Patient Instructions (Addendum)
 For prevention of migraines in pregnancy: -Magnesium oxide, 400mg  by mouth, once daily at bedtime -Vitamin B2, 400mg  by mouth, once daily in the morning  For treatment of migraines in pregnancy: -take medication at the first sign of the pain of a headache, or the first sign of your aura -start with 1000mg  Tylenol  (do not exceed 4000mg  of Tylenol  in 24hrs), with or without Reglan  10mg  -if no relief after 1-2hours, can take Flexeril 10mg  -if headache is severe and not relieved by the above, may take Fioricet, 1 tablet, no more than 3 days per month -Fioricet should only be used as a rescue medication, when absolutely necessary -if the above regimen does not resolve your headache at all, please come to [facility] for additional treatment -if you take Fioricet, please be aware that this has Tylenol  in it and will contribute to the 4,000mg  of Tylenol  that you are allowed to take per day   Consider adding B6 in the morning.

## 2024-05-12 ENCOUNTER — Other Ambulatory Visit (HOSPITAL_COMMUNITY): Payer: Self-pay

## 2024-05-12 ENCOUNTER — Other Ambulatory Visit: Payer: Self-pay

## 2024-05-12 DIAGNOSIS — O285 Abnormal chromosomal and genetic finding on antenatal screening of mother: Secondary | ICD-10-CM | POA: Insufficient documentation

## 2024-05-21 ENCOUNTER — Other Ambulatory Visit (HOSPITAL_COMMUNITY): Payer: Self-pay

## 2024-05-28 ENCOUNTER — Ambulatory Visit

## 2024-06-01 ENCOUNTER — Other Ambulatory Visit: Payer: Self-pay | Admitting: Physician Assistant

## 2024-06-01 ENCOUNTER — Ambulatory Visit (HOSPITAL_BASED_OUTPATIENT_CLINIC_OR_DEPARTMENT_OTHER): Admitting: Obstetrics and Gynecology

## 2024-06-01 ENCOUNTER — Other Ambulatory Visit: Payer: Self-pay | Admitting: *Deleted

## 2024-06-01 ENCOUNTER — Ambulatory Visit: Attending: Physician Assistant

## 2024-06-01 VITALS — BP 112/52 | HR 77

## 2024-06-01 DIAGNOSIS — O281 Abnormal biochemical finding on antenatal screening of mother: Secondary | ICD-10-CM | POA: Diagnosis not present

## 2024-06-01 DIAGNOSIS — O285 Abnormal chromosomal and genetic finding on antenatal screening of mother: Secondary | ICD-10-CM

## 2024-06-01 DIAGNOSIS — O4442 Low lying placenta NOS or without hemorrhage, second trimester: Secondary | ICD-10-CM

## 2024-06-01 DIAGNOSIS — Z3A19 19 weeks gestation of pregnancy: Secondary | ICD-10-CM | POA: Diagnosis not present

## 2024-06-01 DIAGNOSIS — O34219 Maternal care for unspecified type scar from previous cesarean delivery: Secondary | ICD-10-CM

## 2024-06-01 DIAGNOSIS — O280 Abnormal hematological finding on antenatal screening of mother: Secondary | ICD-10-CM

## 2024-06-01 DIAGNOSIS — Z36 Encounter for antenatal screening for chromosomal anomalies: Secondary | ICD-10-CM | POA: Insufficient documentation

## 2024-06-01 DIAGNOSIS — O99212 Obesity complicating pregnancy, second trimester: Secondary | ICD-10-CM | POA: Diagnosis not present

## 2024-06-01 DIAGNOSIS — Z3491 Encounter for supervision of normal pregnancy, unspecified, first trimester: Secondary | ICD-10-CM | POA: Diagnosis present

## 2024-06-01 DIAGNOSIS — Z3689 Encounter for other specified antenatal screening: Secondary | ICD-10-CM | POA: Diagnosis not present

## 2024-06-01 DIAGNOSIS — O99210 Obesity complicating pregnancy, unspecified trimester: Secondary | ICD-10-CM

## 2024-06-01 NOTE — Progress Notes (Addendum)
 Maternal-Fetal Medicine Consultation Name: Jessica Horton MRN: [id number]  G2 P1001 at 19w 4d gestation. Patient is here for fetal anatomy scan. On cell-free fetal DNA screening, the risk for 22 q.11.2 deletion was increased. Patient had genetic counseling and wanted to decide after fetal anatomy scan.  Ultrasound We performed fetal anatomy scan. No makers of aneuploidies or fetal structural defects are seen.  No evidence of cleft left or palate.  Cardiac anatomy appears normal.  Both kidneys appears normal with no evidence of renal anomalies.  Three-vessel tracheal view was normal and there is no ultrasound evidence of thymic aplasia.  Fetal biometry is consistent with her previously-established dates. Amniotic fluid is normal and good fetal activity is seen.   On transabdominal scan, vasa previa was suspected.  After explaining, we performed a transvaginal ultrasound.  No evidence of vasa previa.  Low-lying placenta with placental edge 6 mm from the internal os was seen. The cervix measures 3.7 cm, which is normal.  Increased risk for 22q11.2 deletion syndrome on cell-free fetal DNA screening I explained that ultrasound has limitations in detecting fetal anomalies.  Some fetuses with 22q11.2 deletion syndrome may not show any obvious defects.  I counseled the patient that only amniocentesis will give a definitive result on the fetal karyotype and the deletion.  I explained the procedure and possible complication of miscarriage (1 and 500 procedures).  Patient opted not to have amniocentesis. Her previous child is unaffected.  After genetic counseling, patient understand that this could be a de novo deletion.  Low-lying placenta I reassured the patient that there is no evidence of vasa previa on transvaginal ultrasound.  Low-lying placenta usually resolves with advancing gestation and if resolved, vaginal delivery can be safely attempted.  In some cases of low-lying placenta, vasa  previa may manifest later in gestation.  Patient does not give a history of vaginal bleeding.  I reassured her that in the absence of vaginal bleeding, we do not recommend abstinence from sexual intercourse.  Recommendations - An appointment was made for her to return in 8 weeks for fetal growth assessment and evaluation of placenta. - Transvaginal ultrasound to be performed if low-lying placenta persists or if there is suspicion of vasa previa on color Doppler flow.   Consultation including face-to-face (more than 50%) counseling 30 minutes.

## 2024-06-07 NOTE — Progress Notes (Unsigned)
   PRENATAL VISIT NOTE  Subjective:  Jessica Horton is a 33 y.o. G2P1001 at [redacted]w[redacted]d being seen today for ongoing prenatal care.  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  Patient reports {sx:14538}.   .  .   . Denies leaking of fluid.   The following portions of the patient's history were reviewed and updated as appropriate: allergies, current medications, past family history, past medical history, past social history, past surgical history and problem list.   Objective:    There were no vitals filed for this visit.  Fetal Status:           General: Alert, oriented and cooperative. Patient is in no acute distress.  Skin: Skin is warm and dry. No rash noted.   Cardiovascular: Normal heart rate noted  Respiratory: Normal respiratory effort, no problems with respiration noted  Abdomen: Soft, gravid, appropriate for gestational age.        Pelvic: {Blank single:19197::"Cervical exam performed in the presence of a chaperone","Cervical exam deferred"}        Extremities: Normal range of motion.     Mental Status: Normal mood and affect. Normal behavior. Normal judgment and thought content.   Assessment and Plan:  Pregnancy: G2P1001 at [redacted]w[redacted]d 1. Encounter for supervision of low-risk pregnancy in second trimester (Primary) ***  2. [redacted] weeks gestation of pregnancy ***  3. 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 History of cesarean delivery ***  {Blank single:19197::"Term","Preterm"} labor symptoms and general obstetric precautions including but not limited to vaginal bleeding, contractions, leaking of fluid and fetal movement were reviewed in detail with the patient. Please refer to After Visit Summary for other  counseling recommendations.   No follow-ups on file.  Future Appointments  Date Time Provider Department Center  06/08/2024  3:15 PM Curlie Doughty [facility]  07/27/2024  2:30 PM [facility] PROVIDER 1 [facility]  07/27/2024  3:00 PM [facility] US4 [facility]  08/16/2024  8:20 AM Nche, Connye Delaine, NP [facility]    Salomon Cree, CNM

## 2024-06-08 ENCOUNTER — Ambulatory Visit (INDEPENDENT_AMBULATORY_CARE_PROVIDER_SITE_OTHER): Admitting: Certified Nurse Midwife

## 2024-06-08 ENCOUNTER — Other Ambulatory Visit: Payer: Self-pay

## 2024-06-08 VITALS — BP 95/68 | HR 91 | Wt 204.4 lb

## 2024-06-08 DIAGNOSIS — Z3A20 20 weeks gestation of pregnancy: Secondary | ICD-10-CM

## 2024-06-08 DIAGNOSIS — O26892 Other specified pregnancy related conditions, second trimester: Secondary | ICD-10-CM

## 2024-06-08 DIAGNOSIS — Z3492 Encounter for supervision of normal pregnancy, unspecified, second trimester: Secondary | ICD-10-CM | POA: Diagnosis not present

## 2024-06-08 DIAGNOSIS — O34219 Maternal care for unspecified type scar from previous cesarean delivery: Secondary | ICD-10-CM

## 2024-06-08 DIAGNOSIS — G43E09 Chronic migraine with aura, not intractable, without status migrainosus: Secondary | ICD-10-CM | POA: Diagnosis not present

## 2024-06-08 DIAGNOSIS — Z98891 History of uterine scar from previous surgery: Secondary | ICD-10-CM

## 2024-06-10 LAB — AFP, SERUM, OPEN SPINA BIFIDA
AFP MoM: 1.29
AFP Value: 60.5 ng/mL
Gest. Age on Collection Date: 20 wk
Maternal Age At EDD: 31.4 a
OSBR Risk 1 IN: 4947
Test Results:: NEGATIVE
Weight: 204 [lb_av]

## 2024-06-15 ENCOUNTER — Other Ambulatory Visit: Payer: Self-pay

## 2024-06-15 ENCOUNTER — Emergency Department (HOSPITAL_COMMUNITY)
Admission: EM | Admit: 2024-06-15 | Discharge: 2024-06-16 | Disposition: A | Discharge status: Home / Self Care | Attending: Emergency Medicine | Admitting: Emergency Medicine

## 2024-06-15 DIAGNOSIS — T7840XA Allergy, unspecified, initial encounter: Secondary | ICD-10-CM | POA: Diagnosis not present

## 2024-06-15 DIAGNOSIS — R9431 Abnormal electrocardiogram [ECG] [EKG]: Secondary | ICD-10-CM | POA: Diagnosis not present

## 2024-06-15 DIAGNOSIS — L500 Allergic urticaria: Secondary | ICD-10-CM | POA: Diagnosis not present

## 2024-06-15 DIAGNOSIS — R000 Tachycardia, unspecified: Secondary | ICD-10-CM | POA: Diagnosis not present

## 2024-06-15 DIAGNOSIS — R21 Rash and other nonspecific skin eruption: Secondary | ICD-10-CM | POA: Diagnosis present

## 2024-06-15 MED ORDER — DEXAMETHASONE SODIUM PHOSPHATE 10 MG/ML IJ SOLN
10.0000 mg | Freq: Once | INTRAMUSCULAR | Status: AC
  Administered 2024-06-15: 10 mg via INTRAVENOUS
  Filled 2024-06-15: qty 1

## 2024-06-15 MED ORDER — DIPHENHYDRAMINE HCL 50 MG/ML IJ SOLN
25.0000 mg | Freq: Once | INTRAMUSCULAR | Status: AC
  Administered 2024-06-15: 25 mg via INTRAVENOUS
  Filled 2024-06-15: qty 1

## 2024-06-15 MED ORDER — EPINEPHRINE 0.3 MG/0.3ML IJ SOAJ
0.3000 mg | Freq: Once | INTRAMUSCULAR | Status: AC
  Administered 2024-06-15: 0.3 mg via INTRAMUSCULAR
  Filled 2024-06-15: qty 0.3

## 2024-06-15 NOTE — ED Provider Notes (Signed)
 [place] EMERGENCY DEPARTMENT AT [facility] Provider Note   CSN: [id number] Arrival date & time: 06/15/24  2036     Patient presents with: Urticaria and Allergic Reaction   Jessica Horton is a 33 y.o. female presents today after being stung by a bee.  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  Patient reports urticaria and throat itching.  Patient denies shortness of breath or difficulty breathing.  Patient reports being [redacted] weeks pregnant.    Urticaria  Allergic Reaction Presenting symptoms: rash        Prior to Admission medications   Medication Sig Start Date End Date Taking? Authorizing Provider  albuterol  (VENTOLIN  HFA) 108 (90 Base) MCG/ACT inhaler Inhale 1-2 puffs into the lungs every 6 (six) hours as needed for wheezing or shortness of breath.    [provider]  albuterol  (VENTOLIN ) 2 MG/5ML syrup Take 2 mg by mouth 3 (three) times daily.    [provider]  Atogepant  (QULIPTA ) 30 MG TABS Take 2 tablets (60 mg total) by mouth daily. Patient not taking: Reported on 04/08/2024 02/17/24   Nche, Connye Delaine, NP  butalbital -acetaminophen -caffeine  (FIORICET ) 50-325-40 MG tablet Take 1 tablet by mouth every 6 (six) hours as needed for headache. 05/11/24   Warren-Hill, Aviva Lemmings, CNM  cyclobenzaprine  (FLEXERIL ) 10 MG tablet Take 1 tablet (10 mg total) by mouth every 8 (eight) hours as needed for muscle spasms. 05/11/24   Warren-Hill, Aviva Lemmings, CNM  metoCLOPramide  (REGLAN ) 10 MG tablet Take 1 tablet (10 mg total) by mouth 4 (four) times daily. 05/11/24   Warren-Hill, Aviva Lemmings, CNM  Prenatal Vit-Fe Fumarate-FA (MULTIVITAMIN-PRENATAL) 27-0.8 MG TABS tablet Take 1 tablet by mouth daily at 12 noon.    [provider]  promethazine  (PHENERGAN ) 25 MG tablet Take 1 tablet (25 mg total) by mouth every 6 (six) hours as needed for nausea or vomiting. Patient not taking: Reported on 06/08/2024 04/08/24   Davis, Devon E, PA-C   rizatriptan  (MAXALT ) 10 MG tablet Take 1 tablet (10 mg total) by mouth as needed for migraine. May repeat in 2 hours if needed. No more than 3 tabs in 24hrs Patient not taking: Reported on 06/08/2024 01/14/24   Nche, Connye Delaine, NP    Allergies: Yellow jacket venom    Review of Systems  Skin:  Positive for rash.    Updated Vital Signs BP 133/86   Pulse (!) 101   Temp 98 F (36.7 C)   Resp 16   LMP 01/16/2024 (Exact Date)   SpO2 100%   Physical Exam Vitals and nursing note reviewed.  Constitutional:      General: She is not in acute distress.    Appearance: Normal appearance. She is well-developed.  HENT:     Head: Normocephalic and atraumatic.     Right Ear: External ear normal.     Left Ear: External ear normal.     Nose: Nose normal.     Mouth/Throat:     Mouth: Mucous membranes are moist.     Tongue: Tongue does not deviate from midline.     Pharynx: Oropharynx is clear. Uvula midline. No pharyngeal swelling, posterior oropharyngeal erythema or uvula swelling.   Eyes:     Conjunctiva/sclera: Conjunctivae normal.    Cardiovascular:     Rate and Rhythm: Normal rate and regular rhythm.     Pulses: Normal pulses.     Heart sounds: Normal heart sounds. No murmur heard.    Comments:  Borderline tachycardic on exam Pulmonary:     Effort: Pulmonary effort is normal. No respiratory distress.     Breath sounds: Normal breath sounds. No wheezing.  Abdominal:     Palpations: Abdomen is soft.     Tenderness: There is no abdominal tenderness.   Musculoskeletal:        General: No swelling.     Cervical back: Neck supple.   Skin:    General: Skin is warm and dry.     Capillary Refill: Capillary refill takes less than 2 seconds.     Findings: Rash present.     Comments: Diffuse urticaria on chest, face, bilateral upper extremities.   Neurological:     General: No focal deficit present.     Mental Status: She is alert and oriented to person, place, and time.    Psychiatric:        Mood and Affect: Mood normal.     (all labs ordered are listed, but only abnormal results are displayed) Labs Reviewed - No data to display  EKG: EKG Interpretation Date/Time:  Tuesday June 15 2024 20:48:57 EDT Ventricular Rate:  97 PR Interval:  106 QRS Duration:  82 QT Interval:  345 QTC Calculation: 439 R Axis:   47  Text Interpretation: Sinus rhythm Short PR interval No significant change since last tracing Confirmed by Trish Furl [phone]) on 06/15/2024 8:52:42 PM  Radiology: No results found.   Procedures   Medications Ordered in the ED  EPINEPHrine  (EPI-PEN) injection 0.3 mg (0.3 mg Intramuscular Given 06/15/24 2103)  diphenhydrAMINE  (BENADRYL ) injection 25 mg (25 mg Intravenous Given 06/15/24 2106)  dexamethasone (DECADRON) injection 10 mg (10 mg Intravenous Given 06/15/24 2106)                                    Medical Decision Making Risk Prescription drug management.   This patient presents to the ED for concern of logic reaction differential diagnosis includes anaphylaxis, allergic reaction   Medicines ordered and prescription drug management:  I ordered medication including epi, Benadryl , Decadron    I have reviewed the patients home medicines and have made adjustments as needed   Problem List / ED Course:  EKG with sinus rhythm and short PR interval  Patient signed out to Jessica Bible, PA-C pending 3-hour observation and likely discharge.  Please refer to their note for full ED course.        Final diagnoses:  Allergic reaction, initial encounter    ED Discharge Orders     None          Jessica Lakey N, PA-C 06/15/24 2159    Iva Mariner, MD 06/15/24 2337

## 2024-06-15 NOTE — Discharge Instructions (Signed)
 Today you were seen for an allergic reaction.  Please be sure to keep your EpiPen  on you at all times.  Please return to the ED if you have difficulty breathing or worsening symptoms.  Thank you for letting us  treat you today. After performing a physical exam, I feel you are safe to go home. Please follow up with your PCP in the next several days and provide them with your records from this visit. Return to the Emergency Room if pain becomes severe or symptoms worsen.

## 2024-06-15 NOTE — ED Triage Notes (Incomplete)
 Patient report worsening hives after a baby hornet stung her x 15 mins. Patient denies SOB.

## 2024-06-16 NOTE — ED Provider Notes (Signed)
 Accepted handoff at shift change from [facility]. Please see prior provider note for more detail.   Briefly: Patient is 33 y.o.   DDX: concern for Anaphylaxis to bee sting -- doing well after meds. Needs reassessed at 3-4 hours.   Plan: At 3-hour mark patient is feeling improved, stable for discharge at this time.  Return precautions given.    Karrine Kluttz H, PA-C 06/16/24 0000

## 2024-06-21 ENCOUNTER — Ambulatory Visit: Payer: Self-pay | Admitting: Certified Nurse Midwife

## 2024-06-24 DIAGNOSIS — O359XX0 Maternal care for (suspected) fetal abnormality and damage, unspecified, not applicable or unspecified: Secondary | ICD-10-CM | POA: Diagnosis not present

## 2024-06-24 DIAGNOSIS — Z3A22 22 weeks gestation of pregnancy: Secondary | ICD-10-CM | POA: Diagnosis not present

## 2024-06-24 NOTE — Progress Notes (Signed)
 [facility]  [address] [place] [address] Ph: [phone]  Fax: [phone] Appt: [phone]      Date   06/25/2024  Patient information   Jessica Horton [address] [place] [address] T: [phone]  E: [email or web address] Date of birth: [birth year]  Age: 33 y.o.  Requesting provider   Arna Ranks, MD [address] [facility] [place],  [place] [address] T: [phone]  F: [phone]  Chief complaint   Chief Complaint  Patient presents with  . Other Known/Suspected Fetal Abnormality     History of present illness  I had the pleasure of seeing Ms. Jessica Horton at the [facility] office for fetal cardiac consultation and fetal echocardiography at the request of Arna Ranks, MD. Records were reviewed, and a summary of those records is integrated within the history of present illness. History is obtained from chart review and patient.  Ms. Jessica Horton is a 33 y.o. year old G2P1 woman currently [redacted]w[redacted]d with a single female fetus. Estimated Date of Delivery: 10/22/24.  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  Complications of her current pregnancy include low lying placenta. She denies any other complications with this pregnancy. She has felt good fetal movement. She plans to [facility]. The available medical record was reviewed in detail and is in agreement with the HPI and past medical history.  Past medical/surgical history  No past medical history on file. History reviewed. No pertinent surgical history.   Medications  Ms. Jessica Horton has a current medication list which includes the following prescription(s): cyclobenzaprine , metoclopramide , prenatal vitamin-iron-fa, promethazine , and rizatriptan .    Allergies   Allergies  Allergen Reactions  . Yellow Jacket Venom Anaphylaxis    Family history  There is no known family history of congenital heart disease, arrhythmias, sudden cardiac death, or other birth defects.  Social History   Social History   Tobacco Use  Smoking Status Never  Smokeless Tobacco Never    Review of Systems  A review of systems was negative except as noted in the HPI or below.   Physical Exam  BP 98/71 (BP Location: Right upper arm, Patient Position: Sitting, BP Cuff Size: Large Adult)   Pulse 97   Resp 18   Ht 159 cm (5' 2.6)   Wt 96.1 kg (211 lb 13.8 oz)   LMP 01/16/2024 (Approximate)   SpO2 99%   BMI 38.01 kg/m   Patient is well appearing and in no distress.  She has normal work of breathing. Abdomen is significant for a gravida uterus but is otherwise soft and non-tender. Extremities - no swelling or edema noted. Neuro - grossly intact without focal deficits.  Fetal Echocardiogram  A complete study was performed today, which was technically good.  Please see separate echocardiogram report for full details. There is normal fetal cardiac anatomy and function.  No major heart disease was identified.   Diagnosis     ICD-10-CM   1. Suspected fetal anomaly, antepartum, single or unspecified fetus (HHS-HCC)  O35.9XX0 PEDS ECHO Fetal Echo        Impressions  I am happy to report that  Ms. Jessica Horton's fetal heart appears normal. These results were discussed in detail, and all questions were answered.  There is no fetal cardiac indication to  alter her prenatal care or delivery plan.  The fetus does not require any further cardiac testing.    The limitations to fetal echocardiography include the presence of small to moderate atrial and ventricular septal defects, mild valve abnormalities, persistence of the ductus arteriosus after birth, postnatal development of coarctation of the aorta, and other cardiac and vascular anomalies too subtle  to be imaged prenatally. These limitations were discussed with the patient.  If 22q11.2 deletion is confirmed post natally, a screening echocardiogram should be performed.   Recommendation(s)  No further prenatal cardiac follow-up is required unless any new cardiac concerns are noted.  No fetal cardiac indication to alter newborn care. No postnatal cardiology consultation or echocardiogram is indicated at this time. If there are concerns for heart disease after birth, then pediatric cardiology should be consulted.   It was my pleasure to meet and evaluate Ms. Jessica Horton today. If there are any questions or concerns regarding this evaluation, please do not hesitate to contact me.    I was personally with the patient for 30 minutes. More than 50% of this time was spent doing counseling.  Sincerely,  Darcey Harlem, MD, MPH  Pediatric Cardiology [facility] Phone: [phone], Fax: [phone] On call: [phone] or [phone] [email or web address]  I personally performed the service. (TP)  MCALLISTER CLAUD HARLEM, MD

## 2024-07-05 ENCOUNTER — Telehealth: Payer: Self-pay

## 2024-07-05 ENCOUNTER — Other Ambulatory Visit (HOSPITAL_COMMUNITY): Payer: Self-pay

## 2024-07-05 NOTE — Telephone Encounter (Signed)
 Pharmacy Patient Advocate Encounter   Received notification from CoverMyMeds that prior authorization for Qulipta  30 is required/requested.   Insurance verification completed.   The patient is insured through [facility] .   Patient chart from 06/24/24 shows 22 w pregnant. Please advise

## 2024-07-09 NOTE — Telephone Encounter (Signed)
 Currently pregnant. She has not been taking this medication

## 2024-07-09 NOTE — Addendum Note (Signed)
 Addended by: KATHEEN GARDEN L on: 07/09/2024 09:22 AM   Modules accepted: Orders

## 2024-07-12 ENCOUNTER — Other Ambulatory Visit: Payer: Self-pay

## 2024-07-12 ENCOUNTER — Encounter: Payer: Self-pay | Admitting: Obstetrics and Gynecology

## 2024-07-12 ENCOUNTER — Ambulatory Visit (INDEPENDENT_AMBULATORY_CARE_PROVIDER_SITE_OTHER): Admitting: Obstetrics and Gynecology

## 2024-07-12 ENCOUNTER — Other Ambulatory Visit (HOSPITAL_COMMUNITY): Payer: Self-pay

## 2024-07-12 VITALS — BP 112/72 | HR 91 | Wt 215.0 lb

## 2024-07-12 DIAGNOSIS — Z3A25 25 weeks gestation of pregnancy: Secondary | ICD-10-CM

## 2024-07-12 DIAGNOSIS — E66812 Obesity, class 2: Secondary | ICD-10-CM | POA: Diagnosis not present

## 2024-07-12 DIAGNOSIS — Z3491 Encounter for supervision of normal pregnancy, unspecified, first trimester: Secondary | ICD-10-CM

## 2024-07-12 DIAGNOSIS — O99282 Endocrine, nutritional and metabolic diseases complicating pregnancy, second trimester: Secondary | ICD-10-CM

## 2024-07-12 DIAGNOSIS — O285 Abnormal chromosomal and genetic finding on antenatal screening of mother: Secondary | ICD-10-CM | POA: Diagnosis not present

## 2024-07-12 DIAGNOSIS — O4442 Low lying placenta NOS or without hemorrhage, second trimester: Secondary | ICD-10-CM

## 2024-07-12 DIAGNOSIS — E559 Vitamin D deficiency, unspecified: Secondary | ICD-10-CM | POA: Insufficient documentation

## 2024-07-12 DIAGNOSIS — E6609 Other obesity due to excess calories: Secondary | ICD-10-CM | POA: Diagnosis not present

## 2024-07-12 DIAGNOSIS — O4440 Low lying placenta NOS or without hemorrhage, unspecified trimester: Secondary | ICD-10-CM | POA: Insufficient documentation

## 2024-07-12 DIAGNOSIS — O99212 Obesity complicating pregnancy, second trimester: Secondary | ICD-10-CM | POA: Diagnosis not present

## 2024-07-12 MED ORDER — ONDANSETRON 4 MG PO TBDP
4.0000 mg | ORAL_TABLET | Freq: Three times a day (TID) | ORAL | 0 refills | Status: DC | PRN
Start: 2024-07-12 — End: 2024-10-17
  Filled 2024-07-12 – 2024-10-05 (×2): qty 10, 4d supply, fill #0

## 2024-07-12 NOTE — Patient Instructions (Signed)
 FitSPLINT

## 2024-07-12 NOTE — Progress Notes (Signed)
   PRENATAL VISIT NOTE  Subjective:  Jessica Horton is a 33 y.o. G2P1001 at [redacted]w[redacted]d being seen today for ongoing prenatal care.  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  Patient reports no complaints besides aches/pains of pregnancy, feet swelling.   Contractions: Not present. Vag. Bleeding: None.  Movement: Present. Denies leaking of fluid.   The following portions of the patient's history were reviewed and updated as appropriate: allergies, current medications, past family history, past medical history, past social history, past surgical history and problem list.   Objective:    Vitals:   07/12/24 1627  BP: 112/72  Pulse: 91  Weight: 215 lb (97.5 kg)    Fetal Status:  Fetal Heart Rate (bpm): 147 Fundal Height: 26 cm Movement: Present    General: Alert, oriented and cooperative. Patient is in no acute distress.  Skin: Skin is warm and dry. No rash noted.   Cardiovascular: Normal heart rate noted  Respiratory: Normal respiratory effort, no problems with respiration noted  Abdomen: Soft, gravid, appropriate for gestational age.  Pain/Pressure: Absent     Pelvic: Cervical exam deferred        Extremities: Normal range of motion.  Edema: None  Mental Status: Normal mood and affect. Normal behavior. Normal judgment and thought content.   Assessment and Plan:  Pregnancy: G2P1001 at [redacted]w[redacted]d 1. Encounter for supervision of low-risk pregnancy in first trimester (Primary) 28 wk labs next visit - will send Zofran  to pretreat. Threw it up the last pregnancy.  Discussed moc: condoms/NFP.   2. 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   3. Class 2 obesity due to excess calories  without serious comorbidity with body mass index (BMI) of 36.0 to 36.9 in adult  4. Pregnancy with 25 completed weeks gestation 5. Low-lying placenta 6 mm from internal os at anatomy us . Will continue to follow.  Reviewed pelvic rest.   Preterm labor symptoms and general obstetric precautions including but not limited to vaginal bleeding, contractions, leaking of fluid and fetal movement were reviewed in detail with the patient. Please refer to After Visit Summary for other counseling recommendations.   Return in about 4 weeks (around 08/09/2024) for 2 hr GTT/28w labs, OB VISIT, MD or APP.  Future Appointments  Date Time Provider Department Center  07/27/2024  2:30 PM [facility] PROVIDER 1 [facility]  07/27/2024  3:00 PM [facility] US3 [facility]  08/03/2024  1:55 PM Nicholaus Burnard HERO, MD [facility]  08/16/2024  8:20 AM Nche, Roselie Rockford, NP [facility]  08/17/2024  2:55 PM Nicholaus Burnard HERO, MD [facility]  08/31/2024  3:55 PM Delores Nidia CROME, FNP [facility]  09/14/2024  3:55 PM Regino Camie LABOR, CNM [facility]  09/28/2024  2:35 PM Regino, Camie LABOR, CNM [facility]    Vina Solian, MD

## 2024-07-21 ENCOUNTER — Other Ambulatory Visit (HOSPITAL_COMMUNITY): Payer: Self-pay

## 2024-07-27 ENCOUNTER — Other Ambulatory Visit: Payer: Self-pay

## 2024-07-27 ENCOUNTER — Ambulatory Visit: Attending: Obstetrics and Gynecology

## 2024-07-27 ENCOUNTER — Ambulatory Visit: Admitting: Obstetrics

## 2024-07-27 VITALS — BP 129/73 | HR 98

## 2024-07-27 DIAGNOSIS — O4440 Low lying placenta NOS or without hemorrhage, unspecified trimester: Secondary | ICD-10-CM

## 2024-07-27 DIAGNOSIS — O99210 Obesity complicating pregnancy, unspecified trimester: Secondary | ICD-10-CM | POA: Insufficient documentation

## 2024-07-27 DIAGNOSIS — O4402 Placenta previa specified as without hemorrhage, second trimester: Secondary | ICD-10-CM | POA: Insufficient documentation

## 2024-07-27 DIAGNOSIS — Z3491 Encounter for supervision of normal pregnancy, unspecified, first trimester: Secondary | ICD-10-CM | POA: Insufficient documentation

## 2024-07-27 DIAGNOSIS — O281 Abnormal biochemical finding on antenatal screening of mother: Secondary | ICD-10-CM

## 2024-07-27 DIAGNOSIS — O280 Abnormal hematological finding on antenatal screening of mother: Secondary | ICD-10-CM | POA: Insufficient documentation

## 2024-07-27 DIAGNOSIS — O285 Abnormal chromosomal and genetic finding on antenatal screening of mother: Secondary | ICD-10-CM

## 2024-07-27 DIAGNOSIS — O99212 Obesity complicating pregnancy, second trimester: Secondary | ICD-10-CM | POA: Diagnosis not present

## 2024-07-27 DIAGNOSIS — E669 Obesity, unspecified: Secondary | ICD-10-CM | POA: Diagnosis not present

## 2024-07-27 DIAGNOSIS — O34219 Maternal care for unspecified type scar from previous cesarean delivery: Secondary | ICD-10-CM | POA: Diagnosis not present

## 2024-07-27 DIAGNOSIS — Z3A27 27 weeks gestation of pregnancy: Secondary | ICD-10-CM | POA: Diagnosis not present

## 2024-07-27 DIAGNOSIS — O4442 Low lying placenta NOS or without hemorrhage, second trimester: Secondary | ICD-10-CM | POA: Insufficient documentation

## 2024-07-27 NOTE — Progress Notes (Signed)
 MFM Consult Note  Jessica Horton is currently at 27 weeks and 4 days.  She has been followed as a low-lying placenta was noted during her last ultrasound exam.    Her cell free DNA test also indicated a high risk for 22 q. 11.2 deletion syndrome.  She denies any problems since her last exam.    She had a normal fetal echocardiogram performed with [facility].    On today's exam, the overall EFW of 2 pounds 7 ounces measures at the 43rd percentile for her gestational age.  There was normal amniotic fluid noted.  The previously noted placenta previa has resolved.  There were no signs of vasa previa noted today.  Due to her abnormal cell free DNA test, the patient was offered and declined an amniocentesis today for definitive prenatal diagnosis.    A follow-up growth scan was scheduled in 6 weeks.    The patient stated that all of her questions were answered today.  A total of 20 minutes was spent counseling and coordinating the care for this patient.  Greater than 50% of the time was spent in direct face-to-face contact.

## 2024-08-03 ENCOUNTER — Other Ambulatory Visit: Payer: Self-pay

## 2024-08-03 ENCOUNTER — Encounter: Admitting: Obstetrics and Gynecology

## 2024-08-03 DIAGNOSIS — Z3A28 28 weeks gestation of pregnancy: Secondary | ICD-10-CM

## 2024-08-09 NOTE — Progress Notes (Signed)
   PRENATAL VISIT NOTE  Subjective:  Jessica Horton is a 33 y.o. G2P1001 at [redacted]w[redacted]d being seen today for ongoing prenatal care.  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  Patient reports ongoing left pelvic pain, no change from previous. Her skin has also been dry.  Contractions: Irritability. Vag. Bleeding: None.  Movement: Present. Denies leaking of fluid.   The following portions of the patient's history were reviewed and updated as appropriate: allergies, current medications, past family history, past medical history, past social history, past surgical history and problem list.   Objective:    Vitals:   08/10/24 0842  BP: 113/75  Pulse: 98  Weight: 217 lb 4.8 oz (98.6 kg)    Fetal Status:  Fetal Heart Rate (bpm): 145 Fundal Height: 30 cm Movement: Present    General: Alert, oriented and cooperative. Patient is in no acute distress.  Skin: Skin is warm and dry. No rash noted.   Cardiovascular: Normal heart rate noted  Respiratory: Normal respiratory effort, no problems with respiration noted  Abdomen: Soft, gravid, appropriate for gestational age.  Pain/Pressure: Present     Pelvic: Cervical exam deferred        Extremities: Normal range of motion.  Edema: Trace  Mental Status: Normal mood and affect. Normal behavior. Normal judgment and thought content.   Assessment and Plan:  Pregnancy: G2P1001 at [redacted]w[redacted]d 1. Supervision of high-risk pregnancy (Primary) Prenatal course reviewed BP, HR, FHR within normal limits Feeling regular FM  Recommend support belt, stretches, Tylenol  for pelvic pain. Also provided information on [facility] if needed. Discussed that if desired, we can also place a referral to  PT.  2. Class 2 obesity due to excess calories without serious comorbidity with body mass index (BMI) of 36.0 to 36.9 in adult EFW 43% on 07/27/2024 Repeat growth US  in 6 weeks, scheduled for   3. Low-lying placenta Resolved  4. 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 [redacted] weeks gestation of pregnancy Fundal height appropriate for gestational age.  Needs to complete 28 week labs, including GTT. All have been ordered. Discussed recommendation for Tdap in each pregnancy. Patient would like to review information at home, provided in AVS.   Preterm labor symptoms and general obstetric precautions including but not limited to vaginal bleeding, contractions, leaking of fluid and fetal movement were reviewed in detail with the patient. Please refer to After Visit Summary for other counseling recommendations.   Return in about 1 week (around 08/17/2024) for HOB 30 week appt .  Future Appointments  Date Time Provider Department Center  08/10/2024  9:40 AM [facility]  08/16/2024  8:20 AM Nche, Roselie Rockford, NP [facility]  08/17/2024  2:55 PM Nicholaus Burnard HERO, MD [facility]  08/31/2024  3:55 PM Delores Nidia CROME, FNP [facility]  09/07/2024  2:45 PM [facility] PROVIDER 1 [facility]  09/07/2024  3:00 PM [facility] US1 [facility]  09/14/2024  3:55 PM Regino Camie DELENA EDDY [facility]  09/28/2024  2:35 PM Zina Jerilynn DELENA, MD [facility]    Joesph DELENA Sear, PA

## 2024-08-10 ENCOUNTER — Ambulatory Visit: Admitting: Family Medicine

## 2024-08-10 ENCOUNTER — Other Ambulatory Visit: Payer: Self-pay

## 2024-08-10 VITALS — BP 113/75 | HR 98 | Wt 217.3 lb

## 2024-08-10 DIAGNOSIS — Z6836 Body mass index (BMI) 36.0-36.9, adult: Secondary | ICD-10-CM

## 2024-08-10 DIAGNOSIS — E66812 Obesity, class 2: Secondary | ICD-10-CM | POA: Diagnosis not present

## 2024-08-10 DIAGNOSIS — E559 Vitamin D deficiency, unspecified: Secondary | ICD-10-CM

## 2024-08-10 DIAGNOSIS — Z3A29 29 weeks gestation of pregnancy: Secondary | ICD-10-CM

## 2024-08-10 DIAGNOSIS — O0993 Supervision of high risk pregnancy, unspecified, third trimester: Secondary | ICD-10-CM | POA: Diagnosis not present

## 2024-08-10 DIAGNOSIS — O0990 Supervision of high risk pregnancy, unspecified, unspecified trimester: Secondary | ICD-10-CM

## 2024-08-10 DIAGNOSIS — O4440 Low lying placenta NOS or without hemorrhage, unspecified trimester: Secondary | ICD-10-CM

## 2024-08-10 DIAGNOSIS — Z3493 Encounter for supervision of normal pregnancy, unspecified, third trimester: Secondary | ICD-10-CM

## 2024-08-10 DIAGNOSIS — O285 Abnormal chromosomal and genetic finding on antenatal screening of mother: Secondary | ICD-10-CM | POA: Diagnosis not present

## 2024-08-10 DIAGNOSIS — O4443 Low lying placenta NOS or without hemorrhage, third trimester: Secondary | ICD-10-CM

## 2024-08-10 DIAGNOSIS — Z3A28 28 weeks gestation of pregnancy: Secondary | ICD-10-CM | POA: Diagnosis not present

## 2024-08-10 DIAGNOSIS — E6609 Other obesity due to excess calories: Secondary | ICD-10-CM

## 2024-08-10 NOTE — Patient Instructions (Addendum)
 PELVIC/ABDOMINAL PAIN: Up to 80 percent of women experience groin pain at some point during pregnancy, mostly in that final trimester when stress on the pelvic region is especially intense. Your increasingly heavy baby is burrowing deeper into your pelvis in preparation for birth, and their head is now pressing hard against your bladder, rectum, hips and pelvic bones. The result is an ever-increasing stress on the joints, muscles and organs in your pelvis and back. Soak in a warm tub (not too hot) Tylenol  1000 mg by mouth every 6-8 hrs as needed for pain Slow position changes Laying on the affected side Wear a maternity support belt during the day. Take it off while sleeping. Apply a heating pad to your lower back for 20 minutes at a time, taking at least a 20-minute break before applying again. Massage: Starting at the middle of your pubic bone, trace little circles in a wide U from your pubic bone to your hip bones on both sides.  Then starting just above your pubic bone, press in and down, alternating sides to create a gentle rocking of your uterus back and forth.  Move your hands up the sides of your belly and back down. Do this 3-5 times upon waking and before bed.  Stretches: Get on hands and knees and alternate arching your back deeply while inhaling, and then rounding your back while exhaling. Modified runners lunge:  Sit on a chair with half of your bottom on the chair and half off.  Sit up tall, plant your front foot, and stretch your other foot out behind you.  Breathe deeply for 5 breaths and then do the other side. Chiropractor or massage therapy: [facility] in [place] specializes in pregnancy care. You can visit their website at: [email or web address] to schedule a new patient chiropractic treatment (1 hour) appointment. Please let us  know if you have any other questions or concerns.   TDaP Vaccine Pregnancy Get the Whooping Cough Vaccine While  You Are Pregnant (CDC)  It is important for women to get the whooping cough vaccine in the third trimester of each pregnancy. Vaccines are the best way to prevent this disease. There are 2 different whooping cough vaccines. Both vaccines combine protection against whooping cough, tetanus and diphtheria, but they are for different age groups: Tdap: for everyone 11 years or older, including pregnant women  DTaP: for children 2 months through 75 years of age  You need the whooping cough vaccine during each of your pregnancies The recommended time to get the shot is during your 27th through 36th week of pregnancy, preferably during the earlier part of this time period. The Centers for Disease Control and Prevention (CDC) recommends that pregnant women receive the whooping cough vaccine for adolescents and adults (called Tdap vaccine) during the third trimester of each pregnancy. The recommended time to get the shot is during your 27th through 36th week of pregnancy, preferably during the earlier part of this time period. This replaces the original recommendation that pregnant women get the vaccine only if they had not previously received it. The [employer] of [employer]stetricians and Gynecologists and the [employer] support this recommendation.  You should get the whooping cough vaccine while pregnant to pass protection to your baby After receiving the whooping cough vaccine, your body will create protective antibodies (proteins produced by the body to fight off diseases) and pass some of them to your baby before birth. These antibodies provide your baby some short-term protection against whooping cough in  early life. These antibodies can also protect your baby from some of the more serious complications that come along with whooping cough. Your protective antibodies are at their highest about 2 weeks after getting the vaccine, but it takes time to pass them to your baby. So the  preferred time to get the whooping cough vaccine is early in your third trimester. The amount of whooping cough antibodies in your body decreases over time. That is why CDC recommends you get a whooping cough vaccine during each pregnancy. Doing so allows each of your babies to get the greatest number of protective antibodies from you. This means each of your babies will get the best protection possible against this disease.  Getting the whooping cough vaccine while pregnant is better than getting the vaccine after you give birth Whooping cough vaccination during pregnancy is ideal so your baby will have short-term protection as soon as he is born. This early protection is important because your baby will not start getting his whooping cough vaccines until he is 2 months old. These first few months of life are when your baby is at greatest risk for catching whooping cough. This is also when he's at greatest risk for having severe, potentially life-threating complications from the infection. To avoid that gap in protection, it is best to get a whooping cough vaccine during pregnancy. You will then pass protection to your baby before he is born. To continue protecting your baby, he should get whooping cough vaccines starting at 2 months old. You may never have gotten the Tdap vaccine before and did not get it during this pregnancy. If so, you should make sure to get the vaccine immediately after you give birth, before leaving the hospital or birthing center. It will take about 2 weeks before your body develops protection (antibodies) in response to the vaccine. Once you have protection from the vaccine, you are less likely to give whooping cough to your newborn while caring for him. But remember, your baby will still be at risk for catching whooping cough from others. A recent study looked to see how effective Tdap was at preventing whooping cough in babies whose mothers got the vaccine while pregnant or in the  hospital after giving birth. The study found that getting Tdap between 27 through 36 weeks of pregnancy is 85% more effective at preventing whooping cough in babies younger than 2 months old. Blood tests cannot tell if you need a whooping cough vaccine There are no blood tests that can tell you if you have enough antibodies in your body to protect yourself or your baby against whooping cough. Even if you have been sick with whooping cough in the past or previously received the vaccine, you still should get the vaccine during each pregnancy. Breastfeeding may pass some protective antibodies onto your baby By breastfeeding, you may pass some antibodies you have made in response to the vaccine to your baby. When you get a whooping cough vaccine during your pregnancy, you will have antibodies in your breast milk that you can share with your baby as soon as your milk comes in. However, your baby will not get protective antibodies immediately if you wait to get the whooping cough vaccine until after delivering your baby. This is because it takes about 2 weeks for your body to create antibodies. Learn more about the health benefits of breastfeeding.

## 2024-08-11 LAB — CBC
Hematocrit: 33.2 % — ABNORMAL LOW (ref 34.0–46.6)
Hemoglobin: 10.9 g/dL — ABNORMAL LOW (ref 11.1–15.9)
MCH: 29.2 pg (ref 26.6–33.0)
MCHC: 32.8 g/dL (ref 31.5–35.7)
MCV: 89 fL (ref 79–97)
Platelets: 242 x10E3/uL (ref 150–450)
RBC: 3.73 x10E6/uL — ABNORMAL LOW (ref 3.77–5.28)
RDW: 12.4 % (ref 11.7–15.4)
WBC: 10.8 x10E3/uL (ref 3.4–10.8)

## 2024-08-11 LAB — GLUCOSE TOLERANCE, 2 HOURS W/ 1HR
Glucose, 1 hour: 142 mg/dL (ref 70–179)
Glucose, 2 hour: 97 mg/dL (ref 70–152)
Glucose, Fasting: 95 mg/dL — ABNORMAL HIGH (ref 70–91)

## 2024-08-11 LAB — RPR: RPR Ser Ql: NONREACTIVE

## 2024-08-11 LAB — HIV ANTIBODY (ROUTINE TESTING W REFLEX): HIV Screen 4th Generation wRfx: NONREACTIVE

## 2024-08-12 ENCOUNTER — Ambulatory Visit: Payer: Self-pay | Admitting: Family Medicine

## 2024-08-12 ENCOUNTER — Other Ambulatory Visit (HOSPITAL_COMMUNITY): Payer: Self-pay

## 2024-08-12 ENCOUNTER — Telehealth: Payer: Self-pay | Admitting: Family Medicine

## 2024-08-12 DIAGNOSIS — O0990 Supervision of high risk pregnancy, unspecified, unspecified trimester: Secondary | ICD-10-CM

## 2024-08-12 DIAGNOSIS — O99013 Anemia complicating pregnancy, third trimester: Secondary | ICD-10-CM | POA: Insufficient documentation

## 2024-08-12 DIAGNOSIS — O24410 Gestational diabetes mellitus in pregnancy, diet controlled: Secondary | ICD-10-CM

## 2024-08-12 MED ORDER — ACCU-CHEK GUIDE TEST VI STRP
1.0000 | ORAL_STRIP | Freq: Four times a day (QID) | 12 refills | Status: DC
  Filled 2024-08-12: qty 100, 25d supply, fill #0
  Filled 2024-09-02: qty 100, 25d supply, fill #1
  Filled 2024-10-05: qty 100, 25d supply, fill #2

## 2024-08-12 MED ORDER — ACCU-CHEK GUIDE W/DEVICE KIT
1.0000 | PACK | Freq: Once | 0 refills | Status: AC
  Filled 2024-08-12: qty 1, 30d supply, fill #0

## 2024-08-12 MED ORDER — FERRIC MALTOL 30 MG PO CAPS
1.0000 | ORAL_CAPSULE | Freq: Two times a day (BID) | ORAL | 2 refills | Status: DC
  Filled 2024-08-12: qty 60, 30d supply, fill #0
  Filled 2024-10-05: qty 60, 30d supply, fill #1

## 2024-08-12 MED ORDER — ACCU-CHEK SOFTCLIX LANCETS MISC
1.0000 | Freq: Four times a day (QID) | 4 refills | Status: DC
  Filled 2024-08-12 (×2): qty 100, 25d supply, fill #0
  Filled 2024-10-05: qty 100, 25d supply, fill #1

## 2024-08-12 NOTE — Telephone Encounter (Signed)
 Addressed via mychart

## 2024-08-12 NOTE — Telephone Encounter (Signed)
 Patient calling to speak with someone about her lab results.

## 2024-08-16 ENCOUNTER — Encounter: Payer: 59 | Admitting: Nurse Practitioner

## 2024-08-17 ENCOUNTER — Encounter: Payer: Self-pay | Admitting: Obstetrics and Gynecology

## 2024-08-17 ENCOUNTER — Other Ambulatory Visit: Payer: Self-pay

## 2024-08-17 ENCOUNTER — Other Ambulatory Visit (HOSPITAL_COMMUNITY): Payer: Self-pay

## 2024-08-17 ENCOUNTER — Ambulatory Visit: Admitting: Obstetrics and Gynecology

## 2024-08-17 VITALS — BP 101/65 | HR 89 | Wt 217.2 lb

## 2024-08-17 DIAGNOSIS — Z3A30 30 weeks gestation of pregnancy: Secondary | ICD-10-CM

## 2024-08-17 DIAGNOSIS — O24419 Gestational diabetes mellitus in pregnancy, unspecified control: Secondary | ICD-10-CM | POA: Diagnosis not present

## 2024-08-17 DIAGNOSIS — O99013 Anemia complicating pregnancy, third trimester: Secondary | ICD-10-CM

## 2024-08-17 DIAGNOSIS — O4440 Low lying placenta NOS or without hemorrhage, unspecified trimester: Secondary | ICD-10-CM

## 2024-08-17 DIAGNOSIS — O0990 Supervision of high risk pregnancy, unspecified, unspecified trimester: Secondary | ICD-10-CM

## 2024-08-17 DIAGNOSIS — O285 Abnormal chromosomal and genetic finding on antenatal screening of mother: Secondary | ICD-10-CM

## 2024-08-17 DIAGNOSIS — O24410 Gestational diabetes mellitus in pregnancy, diet controlled: Secondary | ICD-10-CM | POA: Insufficient documentation

## 2024-08-17 DIAGNOSIS — E559 Vitamin D deficiency, unspecified: Secondary | ICD-10-CM

## 2024-08-17 DIAGNOSIS — O24415 Gestational diabetes mellitus in pregnancy, controlled by oral hypoglycemic drugs: Secondary | ICD-10-CM | POA: Insufficient documentation

## 2024-08-17 DIAGNOSIS — O4443 Low lying placenta NOS or without hemorrhage, third trimester: Secondary | ICD-10-CM

## 2024-08-17 DIAGNOSIS — M25552 Pain in left hip: Secondary | ICD-10-CM

## 2024-08-17 DIAGNOSIS — E66812 Obesity, class 2: Secondary | ICD-10-CM

## 2024-08-17 DIAGNOSIS — E6609 Other obesity due to excess calories: Secondary | ICD-10-CM

## 2024-08-17 DIAGNOSIS — Z6836 Body mass index (BMI) 36.0-36.9, adult: Secondary | ICD-10-CM

## 2024-08-17 DIAGNOSIS — Z98891 History of uterine scar from previous surgery: Secondary | ICD-10-CM

## 2024-08-17 DIAGNOSIS — J4520 Mild intermittent asthma, uncomplicated: Secondary | ICD-10-CM

## 2024-08-17 MED ORDER — ALBUTEROL SULFATE HFA 108 (90 BASE) MCG/ACT IN AERS
1.0000 | INHALATION_SPRAY | Freq: Four times a day (QID) | RESPIRATORY_TRACT | 6 refills | Status: AC | PRN
  Filled 2024-08-17: qty 6.7, 20d supply, fill #0
  Filled 2024-10-05: qty 6.7, 25d supply, fill #0

## 2024-08-17 NOTE — Progress Notes (Signed)
 PRENATAL VISIT NOTE  Subjective:  Jessica Horton is a 33 y.o. G2P1001 at [redacted]w[redacted]d being seen today for ongoing prenatal care.  She is currently monitored for the following issues for this high-risk pregnancy and has Asthma, chronic; Migraines; Obesity; Female pattern hair loss; Supervision of high risk pregnancy, antepartum; High risk 22q on NIPS; Low-lying placenta (resolved); Vitamin D  deficiency; Anemia affecting pregnancy in third trimester; GDM (gestational diabetes mellitus); and H/O: C-section on their problem list.  Patient reports has pain in hips on lateral sides, has some difficulty with lifting left leg.  Contractions: Not present. Vag. Bleeding: None.  Movement: Present. Denies leaking of fluid.   The following portions of the patient's history were reviewed and updated as appropriate: allergies, current medications, past family history, past medical history, past social history, past surgical history and problem list.   Objective:    Vitals:   08/17/24 1532  BP: 101/65  Pulse: 89  Weight: 217 lb 3.2 oz (98.5 kg)    Fetal Status:  Fetal Heart Rate (bpm): 139   Movement: Present    General: Alert, oriented and cooperative. Patient is in no acute distress.  Skin: Skin is warm and dry. No rash noted.   Cardiovascular: Normal heart rate noted  Respiratory: Normal respiratory effort, no problems with respiration noted  Abdomen: Soft, gravid, appropriate for gestational age.  Pain/Pressure: Present     Pelvic: Cervical exam deferred        Extremities: Normal range of motion.  Edema: Trace  Mental Status: Normal mood and affect. Normal behavior. Normal judgment and thought content.   Assessment and Plan:  Pregnancy: G2P1001 at [redacted]w[redacted]d  1. Vitamin D  deficiency Will draw today  2. Gestational diabetes mellitus (GDM), antepartum, gestational diabetes method of control unspecified (Primary) Just started checking sugars Pt reports FG 80s, PP 90s Cont diet control Next  growth US  scheduled for 09/07/24  3. Supervision of high risk pregnancy, antepartum  4. High risk 22q on NIPS Declined amnio  5. Class 2 obesity due to excess calories without serious comorbidity with body mass index (BMI) of 36.0 to 36.9 in adult  6. Low-lying placenta (resolved) resolved  7. Anemia affecting pregnancy in third trimester Cont iron  8. Pain of left hip Referral to PT  9. Mild intermittent chronic asthma without complication Inhaler Rx provided today  10. H/O: C-section Reviewed risks/benefits of TOLAC versus RCS in detail. Patient counseled regarding potential vaginal delivery, chance of success, future implications, possible uterine rupture and need for urgent/emergent repeat cesarean. Counseled regarding potential need for repeat c-section for reasons unrelated to first c-section. Counseled regarding scheduled repeat cesarean including risks of bleeding, infection, damage to surrounding tissue, abnormal placentation, implications for future pregnancies. All questions answered.  Patient desires TOLAC, consent signed 08/17/2024.   Preterm labor symptoms and general obstetric precautions including but not limited to vaginal bleeding, contractions, leaking of fluid and fetal movement were reviewed in detail with the patient. Please refer to After Visit Summary for other counseling recommendations.   Return in about 2 weeks (around 08/31/2024) for high OB.  Future Appointments  Date Time Provider Department Center  08/31/2024  3:55 PM Delores Nidia CROME, FNP [facility]  09/07/2024  2:45 PM [facility] PROVIDER 1 [facility]  09/07/2024  3:00 PM [facility] US1 [facility]  09/14/2024  3:55 PM Regino Camie DELENA EDDY [facility]  09/28/2024  2:35 PM Zina Jerilynn DELENA, MD [facility]    Burnard CHRISTELLA Moats, MD

## 2024-08-18 LAB — VITAMIN D 25 HYDROXY (VIT D DEFICIENCY, FRACTURES): Vit D, 25-Hydroxy: 22.4 ng/mL — ABNORMAL LOW (ref 30.0–100.0)

## 2024-08-19 ENCOUNTER — Ambulatory Visit: Payer: Self-pay | Admitting: Obstetrics and Gynecology

## 2024-08-26 ENCOUNTER — Other Ambulatory Visit (HOSPITAL_COMMUNITY): Payer: Self-pay

## 2024-08-27 ENCOUNTER — Other Ambulatory Visit (HOSPITAL_COMMUNITY): Payer: Self-pay

## 2024-08-31 ENCOUNTER — Other Ambulatory Visit: Payer: Self-pay

## 2024-08-31 ENCOUNTER — Ambulatory Visit: Admitting: Obstetrics and Gynecology

## 2024-08-31 VITALS — BP 100/66 | HR 89 | Wt 219.8 lb

## 2024-08-31 DIAGNOSIS — Z98891 History of uterine scar from previous surgery: Secondary | ICD-10-CM | POA: Diagnosis not present

## 2024-08-31 DIAGNOSIS — Z3A32 32 weeks gestation of pregnancy: Secondary | ICD-10-CM

## 2024-08-31 DIAGNOSIS — O0990 Supervision of high risk pregnancy, unspecified, unspecified trimester: Secondary | ICD-10-CM

## 2024-08-31 DIAGNOSIS — O24419 Gestational diabetes mellitus in pregnancy, unspecified control: Secondary | ICD-10-CM

## 2024-08-31 DIAGNOSIS — O0993 Supervision of high risk pregnancy, unspecified, third trimester: Secondary | ICD-10-CM | POA: Diagnosis not present

## 2024-08-31 DIAGNOSIS — O99013 Anemia complicating pregnancy, third trimester: Secondary | ICD-10-CM

## 2024-08-31 DIAGNOSIS — O285 Abnormal chromosomal and genetic finding on antenatal screening of mother: Secondary | ICD-10-CM | POA: Diagnosis not present

## 2024-08-31 NOTE — Progress Notes (Signed)
   PRENATAL VISIT NOTE  Subjective:  Jessica Horton is a 33 y.o. G2P1001 at [redacted]w[redacted]d being seen today for ongoing prenatal care.  She is currently monitored for the following issues for this high-risk pregnancy and has Asthma, chronic; Migraines; Obesity; Female pattern hair loss; Supervision of high risk pregnancy, antepartum; High risk 22q on NIPS; Low-lying placenta (resolved); Vitamin D  deficiency; Anemia affecting pregnancy in third trimester; GDM (gestational diabetes mellitus); and H/O: C-section on their problem list.  Patient reports was previously considering TOLC, she no longer desires, and would like RCS.  Contractions: Not present. Vag. Bleeding: None.  Movement: Present. Denies leaking of fluid.   The following portions of the patient's history were reviewed and updated as appropriate: allergies, current medications, past family history, past medical history, past social history, past surgical history and problem list.   Objective:    Vitals:   08/31/24 1634  BP: 100/66  Pulse: 89  Weight: 219 lb 12.8 oz (99.7 kg)    Fetal Status:  Fetal Heart Rate (bpm): 143   Movement: Present    General: Alert, oriented and cooperative. Patient is in no acute distress.  Skin: Skin is warm and dry. No rash noted.   Cardiovascular: Normal heart rate noted  Respiratory: Normal respiratory effort, no problems with respiration noted  Abdomen: Soft, gravid, appropriate for gestational age.  Pain/Pressure: Absent     Pelvic: Cervical exam deferred        Extremities: Normal range of motion.  Edema: Trace  Mental Status: Normal mood and affect. Normal behavior. Normal judgment and thought content.   Assessment and Plan:  Pregnancy: G2P1001 at [redacted]w[redacted]d 1. Supervision of high risk pregnancy, antepartum (Primary) BP and FHR normal Doing well, feeling regular movement   2. High risk 22q on NIPS Normal fetal echo  3. Gestational diabetes mellitus (GDM), antepartum, gestational diabetes  method of control unspecified Highest fasting 96, otherwise majority < 95 Pp < 120, continue dietary changes Growth u/s 9/9  4. H/O: C-section Desires TOLAC, consent signed previously  Follow up 9/9  5. [redacted] weeks gestation of pregnancy Tdap discussed   Preterm labor symptoms and general obstetric precautions including but not limited to vaginal bleeding, contractions, leaking of fluid and fetal movement were reviewed in detail with the patient. Please refer to After Visit Summary for other counseling recommendations.     Future Appointments  Date Time Provider Department Center  09/07/2024  2:45 PM [facility] PROVIDER 1 [facility]  09/07/2024  3:00 PM [facility] US1 [facility]  09/14/2024  3:55 PM Regino Camie DELENA EDDY [facility]  09/28/2024  2:35 PM Zina Jerilynn DELENA, MD [facility]  10/04/2024  3:15 PM Zina Jerilynn DELENA, MD [facility]  10/12/2024  3:15 PM Izell Harari, MD [facility]    Nidia Daring, FNP

## 2024-09-01 ENCOUNTER — Ambulatory Visit: Admitting: Physical Therapy

## 2024-09-06 ENCOUNTER — Other Ambulatory Visit: Payer: Self-pay | Admitting: Obstetrics and Gynecology

## 2024-09-06 DIAGNOSIS — O0990 Supervision of high risk pregnancy, unspecified, unspecified trimester: Secondary | ICD-10-CM

## 2024-09-07 ENCOUNTER — Ambulatory Visit: Attending: Obstetrics and Gynecology | Admitting: Maternal & Fetal Medicine

## 2024-09-07 ENCOUNTER — Ambulatory Visit

## 2024-09-07 ENCOUNTER — Other Ambulatory Visit: Payer: Self-pay | Admitting: *Deleted

## 2024-09-07 VITALS — BP 114/64 | HR 93

## 2024-09-07 DIAGNOSIS — O0990 Supervision of high risk pregnancy, unspecified, unspecified trimester: Secondary | ICD-10-CM

## 2024-09-07 DIAGNOSIS — O24410 Gestational diabetes mellitus in pregnancy, diet controlled: Secondary | ICD-10-CM | POA: Diagnosis not present

## 2024-09-07 DIAGNOSIS — O281 Abnormal biochemical finding on antenatal screening of mother: Secondary | ICD-10-CM | POA: Insufficient documentation

## 2024-09-07 DIAGNOSIS — O34211 Maternal care for low transverse scar from previous cesarean delivery: Secondary | ICD-10-CM | POA: Insufficient documentation

## 2024-09-07 DIAGNOSIS — O285 Abnormal chromosomal and genetic finding on antenatal screening of mother: Secondary | ICD-10-CM | POA: Insufficient documentation

## 2024-09-07 DIAGNOSIS — O99213 Obesity complicating pregnancy, third trimester: Secondary | ICD-10-CM | POA: Diagnosis not present

## 2024-09-07 DIAGNOSIS — O34219 Maternal care for unspecified type scar from previous cesarean delivery: Secondary | ICD-10-CM

## 2024-09-07 DIAGNOSIS — Z362 Encounter for other antenatal screening follow-up: Secondary | ICD-10-CM | POA: Diagnosis not present

## 2024-09-07 DIAGNOSIS — O4440 Low lying placenta NOS or without hemorrhage, unspecified trimester: Secondary | ICD-10-CM

## 2024-09-07 DIAGNOSIS — E669 Obesity, unspecified: Secondary | ICD-10-CM | POA: Diagnosis not present

## 2024-09-07 DIAGNOSIS — Z3A33 33 weeks gestation of pregnancy: Secondary | ICD-10-CM

## 2024-09-07 DIAGNOSIS — O24415 Gestational diabetes mellitus in pregnancy, controlled by oral hypoglycemic drugs: Secondary | ICD-10-CM

## 2024-09-07 DIAGNOSIS — Z98891 History of uterine scar from previous surgery: Secondary | ICD-10-CM

## 2024-09-07 DIAGNOSIS — O4443 Low lying placenta NOS or without hemorrhage, third trimester: Secondary | ICD-10-CM

## 2024-09-07 NOTE — Progress Notes (Signed)
   Patient information  Patient Name: Jessica Horton  Patient MRN:   [id number]  Referring practice: MFM Referring Provider: [facility]  Problem List   Patient Active Problem List   Diagnosis Date Noted   GDM (gestational diabetes mellitus) 08/17/2024   H/O: C-section 08/17/2024   Anemia affecting pregnancy in third trimester 08/12/2024   Low-lying placenta (resolved) 07/12/2024   Vitamin D  deficiency 07/12/2024   High risk 22q on NIPS 05/12/2024   Supervision of high risk pregnancy, antepartum 04/08/2024   Obesity 07/30/2022   Female pattern hair loss 07/30/2022   Asthma, chronic 01/22/2014   Migraines 01/22/2014    Maternal Fetal medicine Consult  Aryanne WAHBI EL DONLEY is a 33 y.o. G2P1001 at [redacted]w[redacted]d here for ultrasound and consultation. Shakeia WAHBI EL ALAOUI is doing well today with no acute concerns. Today we focused on the following:   The patient has diabetes that is well-controlled with diet alone.  She also has 1 prior cesarean delivery and prefers a repeat cesarean section at 39 weeks which has been scheduled.  I discussed the importance of monitoring her blood sugars and fetal movement.  She will return in 4 weeks for growth ultrasound. The patient had time to ask questions that were answered to her satisfaction.  She verbalized understanding and agrees to proceed with the plan below.  Sonographic findings Single intrauterine pregnancy at 33w 4d.  Fetal cardiac activity:  Observed and appears normal. Presentation: Cephalic. Interval fetal anatomy appears normal. Fetal biometry shows the estimated fetal weight at the 36 percentile. Amniotic fluid volume: Within normal limits. MVP: 6.65 cm. Placenta: Posterior.  There are limitations of prenatal ultrasound such as the inability to detect certain abnormalities due to poor visualization. Various factors such as fetal position, gestational age and maternal body habitus may increase the  difficulty in visualizing the fetal anatomy.    Recommendations -Follow-up in 4 weeks for growth ultrasound -Cesarean delivery at 37 weeks  Review of Systems: A review of systems was performed and was negative except per HPI   Vitals and Physical Exam    09/07/2024    2:57 PM 08/31/2024    4:34 PM 08/17/2024    3:32 PM  Vitals with BMI  Weight  219 lbs 13 oz 217 lbs 3 oz  Systolic 114 100 898  Diastolic 64 66 65  Pulse 93 89 89   Sitting comfortably on the sonogram table Nonlabored breathing Normal rate and rhythm Abdomen is nontender  Past pregnancies OB History  Gravida Para Term Preterm AB Living  2 1 1  0 0 1  SAB IAB Ectopic Multiple Live Births  0 0 0 0 1    # Outcome Date GA Lbr Len/2nd Weight Sex Type Anes PTL Lv  2 Current           1 Term 01/24/14 [redacted]w[redacted]d 25:32 / 03:58 6 lb 11.8 oz (3.056 kg) M CS-LTranv EPI  LIV     I spent 20 minutes reviewing the patients chart, including labs and images as well as counseling the patient about her medical conditions. Greater than 50% of the time was spent in direct face-to-face patient counseling.  Delora Smaller  MFM, [facility]   09/07/2024  3:36 PM

## 2024-09-08 DIAGNOSIS — Z3483 Encounter for supervision of other normal pregnancy, third trimester: Secondary | ICD-10-CM | POA: Diagnosis not present

## 2024-09-08 DIAGNOSIS — Z3482 Encounter for supervision of other normal pregnancy, second trimester: Secondary | ICD-10-CM | POA: Diagnosis not present

## 2024-09-11 NOTE — Progress Notes (Unsigned)
   PRENATAL VISIT NOTE  Subjective:  Jessica Horton is a 33 y.o. G2P1001 at [redacted]w[redacted]d being seen today for ongoing prenatal care.  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  Patient reports no complaints.  Contractions: Irritability. Vag. Bleeding: None.  Movement: Present. Denies leaking of fluid.   The following portions of the patient's history were reviewed and updated as appropriate: allergies, current medications, past family history, past medical history, past social history, past surgical history and problem list.   Objective:    Vitals:   09/14/24 1624  BP: 113/64  Pulse: 97  Weight: 222 lb 3.2 oz (100.8 kg)    Fetal Status:  Fetal Heart Rate (bpm): 136   Movement: Present    General: Alert, oriented and cooperative. Patient is in no acute distress.  Skin: Skin is warm and dry. No rash noted.   Cardiovascular: Normal heart rate noted  Respiratory: Normal respiratory effort, no problems with respiration noted  Abdomen: Soft, gravid, appropriate for gestational age.  Pain/Pressure: Present     Pelvic: Cervical exam deferred        Extremities: Normal range of motion.     Mental Status: Normal mood and affect. Normal behavior. Normal judgment and thought content.   Assessment and Plan:  Pregnancy: G2P1001 at [redacted]w[redacted]d 1. Supervision of high risk pregnancy, antepartum (Primary) - Doing well, feeling regular and vigorous fetal movement  2. [redacted] weeks gestation of pregnancy - Routine PNC, anticipatory guidance.   3. Gestational diabetes mellitus (GDM), antepartum, gestational  diabetes method of control unspecified - Reviewed log. PP values WNL >95%, fasting mostly within range, though with more anomalies. Advised on balancing snacking at night with protein to minimize reactive glucose spikes.  - Offered nutrition/diabetes educator consult, declined at this time. After discussion she feels optimistic she can improve fasting numbers. - MFM growth scheduled.  4. H/O: C-section - If she goes into labor prior to CS, would prefer TOLAC. If she reaches CS date, will proceed with scheduled CS.   5. Need for Tdap vaccination - Administered today,  - Declines Flu here - plans to get at work.   Preterm labor symptoms and general obstetric precautions including but not limited to vaginal bleeding, contractions, leaking of fluid and fetal movement were reviewed in detail with the patient. Please refer to After Visit Summary for other counseling recommendations.   Return in about 2 weeks (around 09/28/2024) for LOB with GBS, with CNM/female please.  Future Appointments  Date Time Provider Department Center  09/28/2024  2:35 PM Nicholaus Burnard HERO, MD [facility]  10/04/2024  3:15 PM Claudene Leos , CNM [facility]  10/07/2024  2:00 PM [facility] PROVIDER 1 [facility]  10/07/2024  2:15 PM [facility] US5 [facility]  10/12/2024  3:15 PM Davis, Devon E, PA-C [facility]    Camie DELENA Rote, CNM

## 2024-09-14 ENCOUNTER — Other Ambulatory Visit: Payer: Self-pay

## 2024-09-14 ENCOUNTER — Ambulatory Visit: Admitting: Certified Nurse Midwife

## 2024-09-14 VITALS — BP 113/64 | HR 97 | Wt 222.2 lb

## 2024-09-14 DIAGNOSIS — Z3A34 34 weeks gestation of pregnancy: Secondary | ICD-10-CM

## 2024-09-14 DIAGNOSIS — Z98891 History of uterine scar from previous surgery: Secondary | ICD-10-CM

## 2024-09-14 DIAGNOSIS — Z23 Encounter for immunization: Secondary | ICD-10-CM

## 2024-09-14 DIAGNOSIS — O24419 Gestational diabetes mellitus in pregnancy, unspecified control: Secondary | ICD-10-CM

## 2024-09-14 DIAGNOSIS — O0990 Supervision of high risk pregnancy, unspecified, unspecified trimester: Secondary | ICD-10-CM

## 2024-09-14 DIAGNOSIS — O0993 Supervision of high risk pregnancy, unspecified, third trimester: Secondary | ICD-10-CM | POA: Diagnosis not present

## 2024-09-17 DIAGNOSIS — Z0289 Encounter for other administrative examinations: Secondary | ICD-10-CM

## 2024-09-24 ENCOUNTER — Telehealth: Payer: Self-pay | Admitting: Family Medicine

## 2024-09-24 NOTE — Telephone Encounter (Signed)
 Patient is calling in regards to her FMLA Papers, state she was denied due to missing items, patient would like a call back

## 2024-09-27 NOTE — Telephone Encounter (Signed)
 Pt state her FMLA was denied due to paperwork had her EDD 10/22/24 on it, her C/s is scheduled for 10/15/24.  Pt requesting date be written for 10/11/24.  Advised pt to bring new copy of her paperwork to prenatal visit on 09/28/24.  PT verbalized understanding.    Waddell, RN

## 2024-09-28 ENCOUNTER — Ambulatory Visit: Admitting: Obstetrics and Gynecology

## 2024-09-28 ENCOUNTER — Encounter: Payer: Self-pay | Admitting: Obstetrics and Gynecology

## 2024-09-28 ENCOUNTER — Other Ambulatory Visit: Payer: Self-pay

## 2024-09-28 ENCOUNTER — Other Ambulatory Visit (HOSPITAL_COMMUNITY)
Admission: RE | Admit: 2024-09-28 | Discharge: 2024-09-28 | Disposition: A | Discharge status: Home / Self Care | Source: Ambulatory Visit | Attending: Certified Nurse Midwife | Admitting: Certified Nurse Midwife

## 2024-09-28 VITALS — BP 110/78 | HR 92 | Wt 219.0 lb

## 2024-09-28 DIAGNOSIS — O0993 Supervision of high risk pregnancy, unspecified, third trimester: Secondary | ICD-10-CM | POA: Diagnosis not present

## 2024-09-28 DIAGNOSIS — O0990 Supervision of high risk pregnancy, unspecified, unspecified trimester: Secondary | ICD-10-CM

## 2024-09-28 DIAGNOSIS — Z98891 History of uterine scar from previous surgery: Secondary | ICD-10-CM | POA: Diagnosis not present

## 2024-09-28 DIAGNOSIS — O24410 Gestational diabetes mellitus in pregnancy, diet controlled: Secondary | ICD-10-CM

## 2024-09-28 DIAGNOSIS — O285 Abnormal chromosomal and genetic finding on antenatal screening of mother: Secondary | ICD-10-CM | POA: Diagnosis not present

## 2024-09-28 DIAGNOSIS — O99013 Anemia complicating pregnancy, third trimester: Secondary | ICD-10-CM

## 2024-09-28 DIAGNOSIS — Z3A36 36 weeks gestation of pregnancy: Secondary | ICD-10-CM

## 2024-09-28 NOTE — Telephone Encounter (Signed)
 New FMLA form completed and faxed with dates pt requested.  Pt was given physical copy as well.   Waddell, RN

## 2024-09-28 NOTE — Progress Notes (Signed)
   PRENATAL VISIT NOTE  Subjective:  Jessica Horton is a 33 y.o. G2P1001 at [redacted]w[redacted]d being seen today for ongoing prenatal care.  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  Patient reports no complaints.  Contractions: Irritability. Vag. Bleeding: None.  Movement: Present. Denies leaking of fluid.   The following portions of the patient's history were reviewed and updated as appropriate: allergies, current medications, past family history, past medical history, past social history, past surgical history and problem list.   Objective:    Vitals:   09/28/24 1520  BP: 110/78  Pulse: 92  Weight: 219 lb (99.3 kg)    Fetal Status:  Fetal Heart Rate (bpm): 135   Movement: Present    General: Alert, oriented and cooperative. Patient is in no acute distress.  Skin: Skin is warm and dry. No rash noted.   Cardiovascular: Normal heart rate noted  Respiratory: Normal respiratory effort, no problems with respiration noted  Abdomen: Soft, gravid, appropriate for gestational age.  Pain/Pressure: Present (pressure/achy hips)     Pelvic: Cervical exam deferred        Extremities: Normal range of motion.  Edema: Trace (bil feet)  Mental Status: Normal mood and affect. Normal behavior. Normal judgment and thought content.   Assessment and Plan:  Pregnancy: G2P1001 at [redacted]w[redacted]d  1. Supervision of high risk pregnancy, antepartum (Primary)  2. Diet controlled gestational diabetes mellitus (GDM), antepartum Fasting in 80s, has had a couple of 104 PP less than 120, reports one 130 in last two  weeks Per MFM note, and pt recollection, has delivery scheduled for RCS at 39 weeks (in plan of MFM note, states 37 weeks but per patient and in body of note, says 39 weeks), with no indicators for delivery at 37 weeks, will keep 39 weeks CS scheduled  3. High risk 22q on NIPS Declined amnio for definitive Echo wnl  4. H/O: C-section Has RCS scheduled for 10/17/24  5. Anemia affecting pregnancy in third trimester  6. [redacted] weeks gestation of pregnancy   Preterm labor symptoms and general obstetric precautions including but not limited to vaginal bleeding, contractions, leaking of fluid and fetal movement were reviewed in detail with the patient. Please refer to After Visit Summary for other counseling recommendations.   Return in about 1 week (around 10/05/2024) for high OB.  Future Appointments  Date Time Provider Department Center  10/04/2024  3:15 PM Claudene Philmore HOWARD [facility]  10/07/2024  2:00 PM [facility] PROVIDER 1 [facility]  10/07/2024  2:15 PM [facility] US5 [facility]  10/12/2024  3:15 PM Zamar Odwyer, Devon E, PA-C [facility]    Burnard CHRISTELLA Moats, MD

## 2024-09-29 DIAGNOSIS — O0990 Supervision of high risk pregnancy, unspecified, unspecified trimester: Secondary | ICD-10-CM | POA: Diagnosis not present

## 2024-09-29 LAB — CERVICOVAGINAL ANCILLARY ONLY
Chlamydia: NEGATIVE
Comment: NEGATIVE
Comment: NORMAL
Neisseria Gonorrhea: NEGATIVE

## 2024-09-29 NOTE — Patient Instructions (Signed)
 Jessica Horton  09/29/2024   Your procedure is scheduled on:  10/15/2024  Arrive at 1015 at Entrance C on [address] at [facility]  and [employer]. You are invited to use the FREE valet parking or use the Visitor's parking deck.  Pick up the phone at the desk and dial [phone].  Call this number if you have problems the morning of surgery: [phone]  Remember:   Do not eat food:(After Midnight) Desps de medianoche.  You may drink clear liquids until  __0815___.  Clear liquids means a liquid you can see thru.  It can have color such as Cola or Kool aid.  Tea is OK and coffee as long as no milk or creamer of any kind.  Take these medicines the morning of surgery with A SIP OF WATER:  none   Do not wear jewelry, make-up or nail polish.  Do not wear lotions, powders, or perfumes. Do not wear deodorant.  Do not shave 48 hours prior to surgery.  Do not bring valuables to the hospital.  [facility] is not   responsible for any belongings or valuables brought to the hospital.  Contacts, dentures or bridgework may not be worn into surgery.  Leave suitcase in the car. After surgery it may be brought to your room.  For patients admitted to the hospital, checkout time is 11:00 AM the day of              discharge.      Please read over the following fact sheets that you were given:     Preparing for Surgery

## 2024-10-01 ENCOUNTER — Encounter (HOSPITAL_COMMUNITY): Payer: Self-pay

## 2024-10-01 ENCOUNTER — Telehealth (HOSPITAL_COMMUNITY): Payer: Self-pay | Admitting: *Deleted

## 2024-10-01 NOTE — Telephone Encounter (Signed)
 Preadmission screen

## 2024-10-02 ENCOUNTER — Ambulatory Visit: Payer: Self-pay | Admitting: Obstetrics and Gynecology

## 2024-10-03 LAB — CULTURE, BETA STREP (GROUP B ONLY): Strep Gp B Culture: NEGATIVE

## 2024-10-04 ENCOUNTER — Encounter: Payer: Self-pay | Admitting: Advanced Practice Midwife

## 2024-10-04 ENCOUNTER — Other Ambulatory Visit: Payer: Self-pay

## 2024-10-04 ENCOUNTER — Ambulatory Visit: Admitting: Advanced Practice Midwife

## 2024-10-04 VITALS — BP 119/76 | HR 83 | Wt 220.9 lb

## 2024-10-04 DIAGNOSIS — Z98891 History of uterine scar from previous surgery: Secondary | ICD-10-CM | POA: Diagnosis not present

## 2024-10-04 DIAGNOSIS — O24410 Gestational diabetes mellitus in pregnancy, diet controlled: Secondary | ICD-10-CM | POA: Diagnosis not present

## 2024-10-04 DIAGNOSIS — O0993 Supervision of high risk pregnancy, unspecified, third trimester: Secondary | ICD-10-CM

## 2024-10-04 DIAGNOSIS — Z23 Encounter for immunization: Secondary | ICD-10-CM | POA: Diagnosis not present

## 2024-10-04 DIAGNOSIS — Z3A37 37 weeks gestation of pregnancy: Secondary | ICD-10-CM

## 2024-10-04 DIAGNOSIS — Z2911 Encounter for prophylactic immunotherapy for respiratory syncytial virus (RSV): Secondary | ICD-10-CM

## 2024-10-04 DIAGNOSIS — O0990 Supervision of high risk pregnancy, unspecified, unspecified trimester: Secondary | ICD-10-CM

## 2024-10-04 NOTE — Patient Instructions (Signed)

## 2024-10-04 NOTE — Progress Notes (Signed)
   PRENATAL VISIT NOTE  Subjective:  Jessica Horton is a 33 y.o. G2P1001 at [redacted]w[redacted]d being seen today for ongoing prenatal care.  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   Patient reports occasional contractions.  Contractions: Irritability. Vag. Bleeding: None.  Movement: Present. Denies leaking of fluid.   The following portions of the patient's history were reviewed and updated as appropriate: allergies, current medications, past family history, past medical history, past social history, past surgical history and problem list.   Objective:   Vitals:   10/04/24 1540  BP: 119/76  Pulse: 83  Weight: 220 lb 14.4 oz (100.2 kg)    Fetal Status: Fetal Heart Rate (bpm): 135   Movement: Present     General:  Alert, oriented and cooperative. Patient is in no acute distress.  Skin: Skin is warm and dry. No rash noted.   Cardiovascular: Normal heart rate noted  Respiratory: Normal respiratory effort, no problems with respiration noted  Abdomen: Soft, gravid, appropriate for gestational age.  Pain/Pressure: Present (Pressure)     Pelvic: Cervical exam deferred        Extremities: Normal range of motion.  Edema: Trace  Mental Status: Normal mood and affect. Normal behavior. Normal judgment and thought content.   US  09/07/24: Est. FW: 2178 gm 4 lb 13 oz 36 %   Assessment and Plan:  Pregnancy: G2P1001 at [redacted]w[redacted]d 1. Supervision of high risk pregnancy, antepartum (Primary) - Respiratory syncytial virus vaccine, preF, subunit, bivalent,(Abrysvo)  2. Need for RSV vaccination - Respiratory  syncytial virus vaccine, preF, subunit, bivalent,(Abrysvo)  3. H/O: C-section - RCS scheduled 10/15/24 4. Gestational diabetes mellitus A1 - CBGs well- controlled w/ diet - Antenatal testing per MFM - Deliver 39 wk RCS  5. [redacted] weeks gestation of pregnancy  Term labor symptoms and general obstetric precautions including but not limited to vaginal bleeding, contractions, leaking of fluid and fetal movement were reviewed in detail with the patient. Please refer to After Visit Summary for other counseling recommendations.   Return in about 1 week (around 10/11/2024) for [facility].  Future Appointments  Date Time Provider Department Center  10/07/2024  2:00 PM [facility] PROVIDER 1 [facility]  10/07/2024  2:15 PM [facility] US5 [facility]  10/12/2024  3:15 PM Davis, Devon E, PA-C [facility]  10/13/2024 10:00 AM [facility] PAT 1 [facility]INDC None    Jessica Horton  Jessica Horton, CNM [facility] for [employer]

## 2024-10-05 ENCOUNTER — Other Ambulatory Visit (HOSPITAL_COMMUNITY): Payer: Self-pay

## 2024-10-05 ENCOUNTER — Other Ambulatory Visit: Payer: Self-pay

## 2024-10-07 ENCOUNTER — Ambulatory Visit: Admitting: Obstetrics

## 2024-10-07 ENCOUNTER — Ambulatory Visit: Attending: Obstetrics and Gynecology

## 2024-10-07 VITALS — BP 121/68 | HR 83

## 2024-10-07 DIAGNOSIS — O99013 Anemia complicating pregnancy, third trimester: Secondary | ICD-10-CM | POA: Insufficient documentation

## 2024-10-07 DIAGNOSIS — O281 Abnormal biochemical finding on antenatal screening of mother: Secondary | ICD-10-CM | POA: Diagnosis not present

## 2024-10-07 DIAGNOSIS — Z3A37 37 weeks gestation of pregnancy: Secondary | ICD-10-CM | POA: Insufficient documentation

## 2024-10-07 DIAGNOSIS — O280 Abnormal hematological finding on antenatal screening of mother: Secondary | ICD-10-CM | POA: Insufficient documentation

## 2024-10-07 DIAGNOSIS — O4443 Low lying placenta NOS or without hemorrhage, third trimester: Secondary | ICD-10-CM

## 2024-10-07 DIAGNOSIS — O99213 Obesity complicating pregnancy, third trimester: Secondary | ICD-10-CM | POA: Insufficient documentation

## 2024-10-07 DIAGNOSIS — O24415 Gestational diabetes mellitus in pregnancy, controlled by oral hypoglycemic drugs: Secondary | ICD-10-CM

## 2024-10-07 DIAGNOSIS — O4440 Low lying placenta NOS or without hemorrhage, unspecified trimester: Secondary | ICD-10-CM

## 2024-10-07 DIAGNOSIS — O24410 Gestational diabetes mellitus in pregnancy, diet controlled: Secondary | ICD-10-CM | POA: Diagnosis not present

## 2024-10-07 DIAGNOSIS — E669 Obesity, unspecified: Secondary | ICD-10-CM | POA: Diagnosis not present

## 2024-10-07 DIAGNOSIS — O0990 Supervision of high risk pregnancy, unspecified, unspecified trimester: Secondary | ICD-10-CM | POA: Diagnosis not present

## 2024-10-07 DIAGNOSIS — O34219 Maternal care for unspecified type scar from previous cesarean delivery: Secondary | ICD-10-CM

## 2024-10-08 NOTE — Progress Notes (Signed)
 MFM Consult Note  Jessica Horton is currently at 37 weeks and 6 days.  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  She denies any problems since her last exam and reports that her fingerstick values have mostly been within normal limits..    She had a normal fetal echocardiogram performed with [facility].    Sonographic findings Single intrauterine pregnancy at 37w 6d.  Fetal cardiac activity:  Observed and appears normal. Presentation: Cephalic. Fetal biometry shows the estimated fetal weight of 6 pounds 2 ounces which measures at the 15th percentile. Amniotic fluid volume: Within normal limits. MVP: 5.39 cm. Placenta: Posterior.  The patient was advised to continue to monitor her fingerstick values on a daily basis.    She already has a repeat cesarean delivery scheduled next week on October 15, 2024.   Due to her abnormal cell free DNA test, her baby should be tested after birth to ensure that the baby does not have the 22 q. 11.2 gene deletion syndrome.   No further exams were scheduled in our office.  The patient stated that all of her questions were answered today.  A total of 10 minutes was spent counseling and coordinating the care for this patient.  Greater than 50% of the time was spent in direct face-to-face contact.

## 2024-10-12 ENCOUNTER — Ambulatory Visit (INDEPENDENT_AMBULATORY_CARE_PROVIDER_SITE_OTHER): Admitting: Physician Assistant

## 2024-10-12 VITALS — BP 115/75 | HR 80 | Wt 221.0 lb

## 2024-10-12 DIAGNOSIS — O24410 Gestational diabetes mellitus in pregnancy, diet controlled: Secondary | ICD-10-CM

## 2024-10-12 DIAGNOSIS — Z98891 History of uterine scar from previous surgery: Secondary | ICD-10-CM | POA: Diagnosis not present

## 2024-10-12 DIAGNOSIS — O0993 Supervision of high risk pregnancy, unspecified, third trimester: Secondary | ICD-10-CM

## 2024-10-12 DIAGNOSIS — Z3A38 38 weeks gestation of pregnancy: Secondary | ICD-10-CM | POA: Diagnosis not present

## 2024-10-12 DIAGNOSIS — O0990 Supervision of high risk pregnancy, unspecified, unspecified trimester: Secondary | ICD-10-CM

## 2024-10-12 DIAGNOSIS — Z6835 Body mass index (BMI) 35.0-35.9, adult: Secondary | ICD-10-CM

## 2024-10-12 DIAGNOSIS — O99013 Anemia complicating pregnancy, third trimester: Secondary | ICD-10-CM

## 2024-10-12 NOTE — Progress Notes (Signed)
   PRENATAL VISIT NOTE  Subjective:  Jessica Horton is a 33 y.o. G2P1001 at [redacted]w[redacted]d being seen today for ongoing prenatal care.  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  Patient reports no complaints.  Contractions: Not present. Vag. Bleeding: None.  Movement: Present. Denies leaking of fluid.   The following portions of the patient's history were reviewed and updated as appropriate: allergies, current medications, past family history, past medical history, past social history, past surgical history and problem list.   Objective:    Vitals:   10/12/24 1547  BP: 115/75  Pulse: 80  Weight: 221 lb (100.2 kg)    Fetal Status:  Fetal Heart Rate (bpm): 135   Movement: Present    General: Alert, oriented and cooperative. Patient is in no acute distress.  Skin: Skin is warm and dry. No rash noted.   Cardiovascular: Normal heart rate noted  Respiratory: Normal respiratory effort, no problems with respiration noted  Abdomen: Soft, gravid, appropriate for gestational age.  Pain/Pressure: Absent     Pelvic: Cervical exam deferred        Extremities: Normal range of motion.  Edema: None  Mental Status: Normal mood and affect. Normal behavior. Normal judgment and thought content.   Assessment and Plan:  Pregnancy: G2P1001 at [redacted]w[redacted]d  1. Supervision of high risk pregnancy, antepartum (Primary) Patient doing well, feeling regular fetal movement BP, FHR, FH appropriate   2. [redacted] weeks gestation of pregnancy Anticipatory guidance about next visits/weeks of pregnancy given.   3. Gestational diabetes mellitus, class  A1 Well-controlled on no medication FBS: 74-95 PPBS: 91-112  4. H/O: C-section Desires repeat C/S  5. Anemia affecting pregnancy in third trimester Hgb 10.9 8/12 Continue oral iron  6. BMI 35.0-35.9,adult Growth US  scheduled  Term labor symptoms and general obstetric precautions including but not limited to vaginal bleeding, contractions, leaking of fluid and fetal movement were reviewed in detail with the patient.  Please refer to After Visit Summary for other counseling recommendations.   Return in about 1 week (around 10/19/2024).  Future Appointments  Date Time Provider Department Center  10/13/2024 10:00 AM [facility] PAT 1 [facility]-INDC None    Landen Breeland E Hashim Eichhorst, PA-C

## 2024-10-13 ENCOUNTER — Encounter (HOSPITAL_COMMUNITY)
Admission: RE | Admit: 2024-10-13 | Discharge: 2024-10-13 | Disposition: A | Discharge status: Home / Self Care | Source: Ambulatory Visit | Attending: Obstetrics and Gynecology | Admitting: Obstetrics and Gynecology

## 2024-10-13 DIAGNOSIS — O0993 Supervision of high risk pregnancy, unspecified, third trimester: Secondary | ICD-10-CM | POA: Insufficient documentation

## 2024-10-13 DIAGNOSIS — Z3A38 38 weeks gestation of pregnancy: Secondary | ICD-10-CM | POA: Insufficient documentation

## 2024-10-13 DIAGNOSIS — Z01812 Encounter for preprocedural laboratory examination: Secondary | ICD-10-CM | POA: Insufficient documentation

## 2024-10-13 DIAGNOSIS — O0990 Supervision of high risk pregnancy, unspecified, unspecified trimester: Secondary | ICD-10-CM

## 2024-10-13 HISTORY — DX: Anemia, unspecified: D64.9

## 2024-10-13 HISTORY — DX: Gestational diabetes mellitus in pregnancy, unspecified control: O24.419

## 2024-10-13 LAB — CBC
HCT: 35.6 % — ABNORMAL LOW (ref 36.0–46.0)
Hemoglobin: 11.5 g/dL — ABNORMAL LOW (ref 12.0–15.0)
MCH: 28.6 pg (ref 26.0–34.0)
MCHC: 32.3 g/dL (ref 30.0–36.0)
MCV: 88.6 fL (ref 80.0–100.0)
Platelets: 235 K/uL (ref 150–400)
RBC: 4.02 MIL/uL (ref 3.87–5.11)
RDW: 13.5 % (ref 11.5–15.5)
WBC: 8.1 K/uL (ref 4.0–10.5)
nRBC: 0 % (ref 0.0–0.2)

## 2024-10-13 LAB — TYPE AND SCREEN
ABO/RH(D): B POS
Antibody Screen: NEGATIVE

## 2024-10-13 LAB — RPR: RPR Ser Ql: NONREACTIVE

## 2024-10-13 NOTE — Anesthesia Preprocedure Evaluation (Addendum)
 Anesthesia Evaluation  Patient identified by MRN, date of birth, ID band Patient awake    Reviewed: Allergy & Precautions, NPO status , Patient's Chart, lab work & pertinent test results  History of Anesthesia Complications Negative for: history of anesthetic complications  Airway Mallampati: III  TM Distance: >3 FB Neck ROM: Full    Dental   Pulmonary neg shortness of breath, asthma (last inhaler use 6-7 months ago) , neg sleep apnea, neg COPD, neg recent URI, Not current smoker   Pulmonary exam normal breath sounds clear to auscultation       Cardiovascular negative cardio ROS  Rhythm:Regular Rate:Normal     Neuro/Psych  Headaches, neg Seizures    GI/Hepatic Neg liver ROS,GERD  ,,  Endo/Other  diabetes, Gestational  Class 3 obesity  Renal/GU negative Renal ROS     Musculoskeletal   Abdominal  (+) + obese  Peds  Hematology  (+) Blood dyscrasia, anemia Lab Results      Component                Value               Date                      WBC                      8.1                 10/13/2024                HGB                      11.5 (L)            10/13/2024                HCT                      35.6 (L)            10/13/2024                MCV                      88.6                10/13/2024                PLT                      235                 10/13/2024              Anesthesia Other Findings   Reproductive/Obstetrics (+) Pregnancy Previous c-section x1                              Anesthesia Physical Anesthesia Plan  ASA: 3  Anesthesia Plan: Spinal   Post-op Pain Management:    Induction:   PONV Risk Score and Plan: 2 and Ondansetron , Dexamethasone  and Treatment may vary due to age or medical condition  Airway Management Planned: Natural Airway  Additional Equipment:   Intra-op Plan:   Post-operative Plan:   Informed Consent: I have reviewed the  patients History and Physical, chart, labs and discussed  the procedure including the risks, benefits and alternatives for the proposed anesthesia with the patient or authorized representative who has indicated his/her understanding and acceptance.     Dental advisory given  Plan Discussed with: CRNA and Anesthesiologist  Anesthesia Plan Comments: (I have discussed risks of neuraxial anesthesia including but not limited to infection, bleeding, nerve injury, back pain, headache, seizures, and failure of block. Patient denies bleeding disorders and is not currently anticoagulated. Labs have been reviewed. Risks and benefits discussed. All patient's questions answered.  )         Anesthesia Quick Evaluation

## 2024-10-15 ENCOUNTER — Other Ambulatory Visit: Payer: Self-pay

## 2024-10-15 ENCOUNTER — Inpatient Hospital Stay (HOSPITAL_COMMUNITY): Payer: Self-pay | Admitting: Anesthesiology

## 2024-10-15 ENCOUNTER — Inpatient Hospital Stay (HOSPITAL_COMMUNITY)
Admission: AD | Admit: 2024-10-15 | Discharge: 2024-10-17 | DRG: 788 | Disposition: A | Discharge status: Home / Self Care | Attending: Obstetrics and Gynecology | Admitting: Obstetrics and Gynecology

## 2024-10-15 ENCOUNTER — Encounter (HOSPITAL_COMMUNITY): Payer: Self-pay | Admitting: Obstetrics and Gynecology

## 2024-10-15 ENCOUNTER — Encounter (HOSPITAL_COMMUNITY): Admission: AD | Disposition: A | Payer: Self-pay | Source: Home / Self Care | Attending: Obstetrics and Gynecology

## 2024-10-15 DIAGNOSIS — O34211 Maternal care for low transverse scar from previous cesarean delivery: Secondary | ICD-10-CM | POA: Diagnosis not present

## 2024-10-15 DIAGNOSIS — O4440 Low lying placenta NOS or without hemorrhage, unspecified trimester: Secondary | ICD-10-CM

## 2024-10-15 DIAGNOSIS — R112 Nausea with vomiting, unspecified: Secondary | ICD-10-CM | POA: Diagnosis not present

## 2024-10-15 DIAGNOSIS — O285 Abnormal chromosomal and genetic finding on antenatal screening of mother: Secondary | ICD-10-CM | POA: Diagnosis present

## 2024-10-15 DIAGNOSIS — Z3A39 39 weeks gestation of pregnancy: Secondary | ICD-10-CM

## 2024-10-15 DIAGNOSIS — K219 Gastro-esophageal reflux disease without esophagitis: Secondary | ICD-10-CM | POA: Diagnosis not present

## 2024-10-15 DIAGNOSIS — O24420 Gestational diabetes mellitus in childbirth, diet controlled: Secondary | ICD-10-CM | POA: Diagnosis not present

## 2024-10-15 DIAGNOSIS — O99013 Anemia complicating pregnancy, third trimester: Secondary | ICD-10-CM

## 2024-10-15 DIAGNOSIS — E669 Obesity, unspecified: Secondary | ICD-10-CM | POA: Diagnosis present

## 2024-10-15 DIAGNOSIS — O9962 Diseases of the digestive system complicating childbirth: Secondary | ICD-10-CM | POA: Diagnosis not present

## 2024-10-15 DIAGNOSIS — Z3490 Encounter for supervision of normal pregnancy, unspecified, unspecified trimester: Secondary | ICD-10-CM

## 2024-10-15 DIAGNOSIS — O0990 Supervision of high risk pregnancy, unspecified, unspecified trimester: Principal | ICD-10-CM

## 2024-10-15 DIAGNOSIS — Z98891 History of uterine scar from previous surgery: Secondary | ICD-10-CM

## 2024-10-15 DIAGNOSIS — E66813 Obesity, class 3: Secondary | ICD-10-CM | POA: Diagnosis present

## 2024-10-15 DIAGNOSIS — Z833 Family history of diabetes mellitus: Secondary | ICD-10-CM | POA: Diagnosis not present

## 2024-10-15 DIAGNOSIS — O99214 Obesity complicating childbirth: Secondary | ICD-10-CM | POA: Diagnosis present

## 2024-10-15 DIAGNOSIS — O24410 Gestational diabetes mellitus in pregnancy, diet controlled: Secondary | ICD-10-CM | POA: Diagnosis present

## 2024-10-15 DIAGNOSIS — Z8249 Family history of ischemic heart disease and other diseases of the circulatory system: Secondary | ICD-10-CM

## 2024-10-15 LAB — ABO/RH: ABO/RH(D): B POS

## 2024-10-15 LAB — GLUCOSE, CAPILLARY
Glucose-Capillary: 81 mg/dL (ref 70–99)
Glucose-Capillary: 80 mg/dL (ref 70–99)

## 2024-10-15 SURGERY — Surgical Case
Anesthesia: Spinal

## 2024-10-15 MED ORDER — OXYTOCIN-SODIUM CHLORIDE 30-0.9 UT/500ML-% IV SOLN: 2.5000 [IU]/h | INTRAVENOUS | Status: DC | PRN

## 2024-10-15 MED ORDER — FENTANYL CITRATE (PF) 100 MCG/2ML IJ SOLN: 25.0000 ug | INTRAMUSCULAR | Status: DC | PRN

## 2024-10-15 MED ORDER — ACETAMINOPHEN 10 MG/ML IV SOLN
INTRAVENOUS | Status: AC
  Filled 2024-10-15: qty 100

## 2024-10-15 MED ORDER — KETOROLAC TROMETHAMINE 30 MG/ML IJ SOLN: 30.0000 mg | Freq: Four times a day (QID) | INTRAMUSCULAR | Status: AC | PRN

## 2024-10-15 MED ORDER — ONDANSETRON HCL 4 MG/2ML IJ SOLN
INTRAMUSCULAR | Status: AC
  Filled 2024-10-15: qty 2

## 2024-10-15 MED ORDER — ONDANSETRON HCL 4 MG/2ML IJ SOLN
INTRAMUSCULAR | Status: DC | PRN
  Administered 2024-10-15: 4 mg via INTRAVENOUS

## 2024-10-15 MED ORDER — OXYTOCIN-SODIUM CHLORIDE 30-0.9 UT/500ML-% IV SOLN
INTRAVENOUS | Status: DC | PRN
  Administered 2024-10-15: 400 mL via INTRAVENOUS

## 2024-10-15 MED ORDER — KETOROLAC TROMETHAMINE 30 MG/ML IJ SOLN
30.0000 mg | Freq: Once | INTRAMUSCULAR | Status: AC | PRN
  Administered 2024-10-15: 30 mg via INTRAVENOUS

## 2024-10-15 MED ORDER — IBUPROFEN 600 MG PO TABS
600.0000 mg | ORAL_TABLET | Freq: Four times a day (QID) | ORAL | Status: DC
  Administered 2024-10-16 – 2024-10-17 (×6): 600 mg via ORAL
  Filled 2024-10-15 (×6): qty 1

## 2024-10-15 MED ORDER — ONDANSETRON HCL 4 MG/2ML IJ SOLN
4.0000 mg | Freq: Three times a day (TID) | INTRAMUSCULAR | Status: DC | PRN
  Administered 2024-10-15: 4 mg via INTRAVENOUS
  Filled 2024-10-15: qty 2

## 2024-10-15 MED ORDER — MORPHINE SULFATE (PF) 0.5 MG/ML IJ SOLN
INTRAMUSCULAR | Status: DC | PRN
  Administered 2024-10-15: 150 ug via INTRATHECAL

## 2024-10-15 MED ORDER — ACETAMINOPHEN 10 MG/ML IV SOLN
INTRAVENOUS | Status: DC | PRN
  Administered 2024-10-15: 1000 mg via INTRAVENOUS

## 2024-10-15 MED ORDER — CEFAZOLIN SODIUM-DEXTROSE 2-4 GM/100ML-% IV SOLN
2.0000 g | INTRAVENOUS | Status: AC
  Administered 2024-10-15: 2 g via INTRAVENOUS

## 2024-10-15 MED ORDER — NALOXONE HCL 0.4 MG/ML IJ SOLN: 0.4000 mg | INTRAMUSCULAR | Status: DC | PRN

## 2024-10-15 MED ORDER — OXYTOCIN-SODIUM CHLORIDE 30-0.9 UT/500ML-% IV SOLN
INTRAVENOUS | Status: AC
  Filled 2024-10-15: qty 500

## 2024-10-15 MED ORDER — FENTANYL CITRATE (PF) 100 MCG/2ML IJ SOLN
INTRAMUSCULAR | Status: DC | PRN
  Administered 2024-10-15: 15 ug via INTRATHECAL

## 2024-10-15 MED ORDER — ACETAMINOPHEN 325 MG PO TABS
650.0000 mg | ORAL_TABLET | ORAL | Status: DC | PRN
  Administered 2024-10-16 – 2024-10-17 (×2): 650 mg via ORAL
  Filled 2024-10-15 (×3): qty 2

## 2024-10-15 MED ORDER — ZOLPIDEM TARTRATE 5 MG PO TABS: 5.0000 mg | ORAL_TABLET | Freq: Every evening | ORAL | Status: DC | PRN

## 2024-10-15 MED ORDER — PRENATAL MULTIVITAMIN CH
1.0000 | ORAL_TABLET | Freq: Every day | ORAL | Status: DC
  Administered 2024-10-16: 1 via ORAL
  Filled 2024-10-15: qty 1

## 2024-10-15 MED ORDER — PHENYLEPHRINE HCL-NACL 20-0.9 MG/250ML-% IV SOLN
INTRAVENOUS | Status: AC
  Filled 2024-10-15: qty 250

## 2024-10-15 MED ORDER — SENNOSIDES-DOCUSATE SODIUM 8.6-50 MG PO TABS
2.0000 | ORAL_TABLET | ORAL | Status: DC
  Administered 2024-10-16: 2 via ORAL
  Filled 2024-10-15: qty 2

## 2024-10-15 MED ORDER — FENTANYL CITRATE (PF) 100 MCG/2ML IJ SOLN
INTRAMUSCULAR | Status: AC
  Filled 2024-10-15: qty 2

## 2024-10-15 MED ORDER — TRANEXAMIC ACID-NACL 1000-0.7 MG/100ML-% IV SOLN
1000.0000 mg | Freq: Once | INTRAVENOUS | Status: AC
  Administered 2024-10-15: 1000 mg via INTRAVENOUS

## 2024-10-15 MED ORDER — POVIDONE-IODINE 10 % EX SWAB
2.0000 | Freq: Once | CUTANEOUS | Status: AC
  Administered 2024-10-15: 2 via TOPICAL

## 2024-10-15 MED ORDER — LACTATED RINGERS IV SOLN: INTRAVENOUS | Status: DC

## 2024-10-15 MED ORDER — SIMETHICONE 80 MG PO CHEW: 80.0000 mg | CHEWABLE_TABLET | ORAL | Status: DC | PRN

## 2024-10-15 MED ORDER — METOCLOPRAMIDE HCL 5 MG/ML IJ SOLN
INTRAMUSCULAR | Status: AC
  Filled 2024-10-15: qty 2

## 2024-10-15 MED ORDER — DIPHENHYDRAMINE HCL 50 MG/ML IJ SOLN: 12.5000 mg | Freq: Four times a day (QID) | INTRAMUSCULAR | Status: DC | PRN

## 2024-10-15 MED ORDER — MORPHINE SULFATE (PF) 0.5 MG/ML IJ SOLN
INTRAMUSCULAR | Status: AC
  Filled 2024-10-15: qty 10

## 2024-10-15 MED ORDER — DIPHENHYDRAMINE HCL 25 MG PO CAPS: 25.0000 mg | ORAL_CAPSULE | Freq: Four times a day (QID) | ORAL | Status: DC | PRN

## 2024-10-15 MED ORDER — TRANEXAMIC ACID-NACL 1000-0.7 MG/100ML-% IV SOLN
INTRAVENOUS | Status: AC
  Filled 2024-10-15: qty 100

## 2024-10-15 MED ORDER — BUPIVACAINE IN DEXTROSE 0.75-8.25 % IT SOLN
INTRATHECAL | Status: DC | PRN
  Administered 2024-10-15: 1.6 mL via INTRATHECAL

## 2024-10-15 MED ORDER — DEXMEDETOMIDINE HCL IN NACL 80 MCG/20ML IV SOLN
INTRAVENOUS | Status: AC
  Filled 2024-10-15: qty 20

## 2024-10-15 MED ORDER — KETOROLAC TROMETHAMINE 30 MG/ML IJ SOLN
INTRAMUSCULAR | Status: AC
  Filled 2024-10-15: qty 1

## 2024-10-15 MED ORDER — DIBUCAINE (PERIANAL) 1 % EX OINT: 1.0000 | TOPICAL_OINTMENT | CUTANEOUS | Status: DC | PRN

## 2024-10-15 MED ORDER — PROMETHAZINE (PHENERGAN) 6.25MG IN NS 50ML IVPB: 6.2500 mg | INTRAVENOUS | Status: DC | PRN

## 2024-10-15 MED ORDER — MEASLES, MUMPS & RUBELLA VAC ~~LOC~~ SUSR: 0.5000 mL | Freq: Once | SUBCUTANEOUS | Status: DC

## 2024-10-15 MED ORDER — COCONUT OIL OIL: 1.0000 | TOPICAL_OIL | Status: DC | PRN

## 2024-10-15 MED ORDER — SCOPOLAMINE 1 MG/3DAYS TD PT72
1.0000 | MEDICATED_PATCH | TRANSDERMAL | Status: DC
  Administered 2024-10-15: 1 mg via TRANSDERMAL
  Filled 2024-10-15: qty 1

## 2024-10-15 MED ORDER — OXYCODONE HCL 5 MG PO TABS
5.0000 mg | ORAL_TABLET | ORAL | Status: DC | PRN
  Administered 2024-10-16 – 2024-10-17 (×2): 5 mg via ORAL
  Filled 2024-10-15 (×2): qty 1

## 2024-10-15 MED ORDER — CEFAZOLIN SODIUM-DEXTROSE 2-4 GM/100ML-% IV SOLN
INTRAVENOUS | Status: AC
  Filled 2024-10-15: qty 100

## 2024-10-15 MED ORDER — ACETAMINOPHEN 500 MG PO TABS
1000.0000 mg | ORAL_TABLET | Freq: Four times a day (QID) | ORAL | Status: AC
  Administered 2024-10-16 (×3): 1000 mg via ORAL
  Filled 2024-10-15 (×4): qty 2

## 2024-10-15 MED ORDER — STERILE WATER FOR IRRIGATION IR SOLN
Status: DC | PRN
  Administered 2024-10-15: 1

## 2024-10-15 MED ORDER — WITCH HAZEL-GLYCERIN EX PADS: 1.0000 | MEDICATED_PAD | CUTANEOUS | Status: DC | PRN

## 2024-10-15 MED ORDER — SODIUM CHLORIDE 0.9% FLUSH: 3.0000 mL | INTRAVENOUS | Status: DC | PRN

## 2024-10-15 MED ORDER — BENZOCAINE-MENTHOL 20-0.5 % EX AERO: 1.0000 | INHALATION_SPRAY | CUTANEOUS | Status: DC | PRN

## 2024-10-15 MED ADMIN — Phenylephrine-NaCl IV Solution 20 MG/250ML-0.9%: 60 ug/min | INTRAVENOUS | NDC 99999070041

## 2024-10-15 MED ADMIN — Dexamethasone Sod Phosphate Preservative Free Inj 10 MG/ML: 8 mg | INTRAVENOUS | NDC 25021005301

## 2024-10-15 MED ADMIN — Dexmedetomidine HCl in NaCl 0.9% IV Soln 80 MCG/20ML: 8 ug | INTRAVENOUS | NDC 00781349395

## 2024-10-15 SURGICAL SUPPLY — 28 items
BENZOIN TINCTURE PRP APPL 2/3 (GAUZE/BANDAGES/DRESSINGS) ×1 IMPLANT
CHLORAPREP W/TINT 26 (MISCELLANEOUS) ×2 IMPLANT
CLAMP UMBILICAL CORD (MISCELLANEOUS) ×1 IMPLANT
CLOTH BEACON ORANGE TIMEOUT ST (SAFETY) ×1 IMPLANT
DRSG OPSITE POSTOP 4X10 (GAUZE/BANDAGES/DRESSINGS) ×1 IMPLANT
ELECTRODE REM PT RTRN 9FT ADLT (ELECTROSURGICAL) ×1 IMPLANT
EXTRACTOR VACUUM M CUP 4 TUBE (SUCTIONS) IMPLANT
GAUZE PAD ABD 7.5X8 STRL (GAUZE/BANDAGES/DRESSINGS) IMPLANT
GAUZE SPONGE 4X4 12PLY STRL LF (GAUZE/BANDAGES/DRESSINGS) IMPLANT
GLOVE BIOGEL PI IND STRL 7.0 (GLOVE) ×2 IMPLANT
GLOVE BIOGEL PI IND STRL 7.5 (GLOVE) ×2 IMPLANT
GLOVE ECLIPSE 7.5 STRL STRAW (GLOVE) ×1 IMPLANT
GOWN STRL REUS W/TWL LRG LVL3 (GOWN DISPOSABLE) ×3 IMPLANT
KIT ABG SYR 3ML LUER SLIP (SYRINGE) IMPLANT
NDL HYPO 25X5/8 SAFETYGLIDE (NEEDLE) IMPLANT
NEEDLE HYPO 25X5/8 SAFETYGLIDE (NEEDLE) IMPLANT
NS IRRIG 1000ML POUR BTL (IV SOLUTION) ×1 IMPLANT
PACK C SECTION WH (CUSTOM PROCEDURE TRAY) ×1 IMPLANT
PAD OB MATERNITY 4.3X12.25 (PERSONAL CARE ITEMS) ×1 IMPLANT
RTRCTR C-SECT PINK 25CM LRG (MISCELLANEOUS) ×1 IMPLANT
STRIP CLOSURE SKIN 1/2X4 (GAUZE/BANDAGES/DRESSINGS) ×1 IMPLANT
SUT VIC AB 0 CT1 36 (SUTURE) ×3 IMPLANT
SUT VIC AB 2-0 CT1 TAPERPNT 27 (SUTURE) ×1 IMPLANT
SUT VIC AB 4-0 KS 27 (SUTURE) ×1 IMPLANT
TAPE CLOTH SURG 4X10 WHT LF (GAUZE/BANDAGES/DRESSINGS) IMPLANT
TOWEL OR 17X24 6PK STRL BLUE (TOWEL DISPOSABLE) ×1 IMPLANT
TRAY FOLEY W/BAG SLVR 14FR LF (SET/KITS/TRAYS/PACK) ×1 IMPLANT
WATER STERILE IRR 1000ML POUR (IV SOLUTION) ×1 IMPLANT

## 2024-10-15 NOTE — Lactation Note (Signed)
 This note was copied from a baby's chart. Lactation Consultation Note  Patient Name: Jessica Horton Date: 10/15/2024 Age:31 hours Reason for consult: Initial assessment;Term MOB latched infant prior to [facility] entering the room. MOB states  breastfeeding is going well. LC did not assist with latch, infant was still breastfeeding as LC left the room. MOB is experienced with breastfeeding see maternal data below. MOB will continue to breastfeed infant by cues, on demand, 8-12 times within 24 hours, skin to skin. LC discussed the importance of maternal rest, meals and hydration. MOB was made aware of O/P services, breastfeeding support groups, community resources, and our phone # for post-discharge questions.    Maternal Data Does the patient have breastfeeding experience prior to this delivery?: Yes How long did the patient breastfeed?: MOB states  I breastfeed my first child for 2 years 6 months.  Feeding Mother's Current Feeding Choice: Breast Milk and Formula  LATCH Score Latch: Grasps breast easily, tongue down, lips flanged, rhythmical sucking.  Audible Swallowing: A few with stimulation  Type of Nipple: Everted at rest and after stimulation  Comfort (Breast/Nipple): Soft / non-tender  Hold (Positioning): No assistance needed to correctly position infant at breast.  LATCH Score: 9   Lactation Tools Discussed/Used    Interventions Interventions: Breast feeding basics reviewed;Skin to skin;Education;Guidelines for Milk Supply and Pumping Schedule Handout;CDC milk storage guidelines;CDC Guidelines for Breast Pump Cleaning  Discharge Pump: DEBP;Personal  Consult Status Consult Status: Follow-up Date: 10/16/24 Follow-up type: In-patient    Jessica Horton 10/15/2024, 7:22 PM

## 2024-10-15 NOTE — Discharge Summary (Signed)
 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 yo G2P1001 at [redacted]w[redacted]d was admitted to the hospital 10/15/2024 for scheduled cesarean section with the following indication:Elective Repeat.Delivery details are as follows:  Membrane Rupture Time/Date: 12:16 PM,10/15/2024  Delivery Method:C-Section, Low Transverse Operative Delivery:N/A Details of operation can be found in separate operative note.  Patient had a postpartum course complicated by***.  She is ambulating, tolerating a regular diet, passing flatus, and urinating well. Patient is discharged home in stable condition on  10/15/24        Newborn Data: Birth date:10/15/2024 Birth time:12:17 PM Gender:Female Living status:Living Apgars:9 ,9  Weight:3040 g    Magnesium Sulfate received: No BMZ received: No Rhophylac:N/A MMR:N/A T-DaP:Given prenatally Flu: given prenatally RSV Vaccine received: No Transfusion:{Transfusion received:30440034}  Immunizations received: Immunization History  Administered Date(s) Administered    sv, Bivalent, Protein Subunit Rsvpref,pf (Abrysvo) 10/04/2024    Influenza Split 10/14/2013   Influenza,inj,Quad PF,6+ Mos 09/25/2018, 10/22/2021, 10/28/2022   Influenza,inj,quad, With Preservative 10/27/2020   Influenza-Unspecified 10/03/2018, 10/30/2023   PFIZER(Purple Top)SARS-COV-2 Vaccination 09/10/2020, 10/02/2020   Tdap 11/11/2013, 09/14/2018, 09/14/2024    Physical exam  Vitals:   10/15/24 1025 10/15/24 1030  BP: 103/72   Pulse: 70   Resp: 18   Temp: 98.4 F (36.9 C)   TempSrc: Oral   Weight:  99.9 kg  Height:  5' 2 (1.575 m)   General: {Exam; general:21111117} Lochia: {Desc; appropriate/inappropriate:30686::appropriate} Uterine Fundus: {Desc; firm/soft:30687} Incision: {Exam; incision:21111123} DVT Evaluation: {Exam; dvt:2111122} Labs: Lab Results  Component Value Date   WBC 8.1 10/13/2024   HGB 11.5 (L) 10/13/2024   HCT 35.6 (L) 10/13/2024   MCV 88.6 10/13/2024   PLT 235 10/13/2024      Latest Ref Rng & Units 02/26/2024   10:37 AM  CMP  Glucose 70 - 99 mg/dL 85   BUN 6 - 20 mg/dL 9   Creatinine 9.42 - 8.99 mg/dL 9.52   Sodium 865 - 855 mmol/L 135   Potassium 3.5 - 5.2 mmol/L 4.2   Chloride 96 - 106 mmol/L 101   CO2 20 - 29 mmol/L 20   Calcium 8.7 - 10.2 mg/dL 9.4   Total Protein 6.0 - 8.5 g/dL 7.3   Total Bilirubin 0.0 - 1.2 mg/dL 0.7   Alkaline Phos 44 - 121 IU/L 91   AST 0 - 40 IU/L 15   ALT 0 - 32 IU/L 15    Edinburgh Score:     No data to display  No data recorded  After visit meds:  Allergies as of 10/15/2024       Reactions   Yellow Jacket Venom Anaphylaxis     Med Rec must be completed prior to using this [facility]***        Discharge home in stable condition Infant Feeding: Breast Infant Disposition:home with mother Discharge instruction: per After Visit Summary and Postpartum booklet. Activity: Advance as tolerated. Pelvic rest for 6 weeks.  Diet: routine diet Future Appointments:No future appointments. Follow up Visit:   Please schedule this patient for a In person  postpartum visit in 6 weeks with the following provider: Any provider. Additional Postpartum F/U:Incision check 1 week  High risk pregnancy complicated by: [id number] Delivery mode:  C-Section, Low Transverse Anticipated Birth Control:  Condoms/NFP   10/15/2024 Leeroy KATHEE Pouch, MD

## 2024-10-15 NOTE — Transfer of Care (Signed)
 Immediate Anesthesia Transfer of Care Note  Patient: Jessica Horton  Procedure(s) Performed: CESAREAN DELIVERY  Patient Location: PACU  Anesthesia Type:Spinal  Level of Consciousness: awake, alert , and oriented  Airway & Oxygen Therapy: Patient Spontanous Breathing  Post-op Assessment: Report given to RN and Post -op Vital signs reviewed and stable  Post vital signs: Reviewed and stable  Last Vitals:  Vitals Value Taken Time  BP 104/56 10/15/24 13:19  Temp    Pulse 70 10/15/24 13:20  Resp 18 10/15/24 13:20  SpO2 100 % 10/15/24 13:20  Vitals shown include unfiled device data.  Last Pain:  Vitals:   10/15/24 1030  TempSrc:   PainSc: 0-No pain         Complications: No notable events documented.

## 2024-10-15 NOTE — Op Note (Signed)
 Cesarean Section Operative Report  [name]  10/15/2024  Indications: prior cesarean, maternal request (declines TOLAC)  Pre-operative Diagnosis: repeat low transverse cesarean section  Post-operative Diagnosis: Same   Surgeon: Surgeons and Role:    * Rebbeca Sheperd, Devaughn Sayres, MD - Primary   Attending Attestation: I was present and scrubbed for the entire procedure.   An experienced assistant was required given the standard of surgical care given the complexity of the case.  This assistant was needed for exposure, dissection, suctioning, retraction, instrument exchange, assisting with delivery with administration of fundal pressure, and for overall help during the procedure.  Anesthesia: spinal    Quantified Blood Loss: 534 ml  Total IV Fluids: 1400 ml LR  Urine Output:: 200 ml yellow urine  Specimens: none  Findings: Viable female infant in cephalic presentation; Apgars pending; weight pending; arterial cord pH not obtained;  clear amniotic fluid; intact placenta with three vessel cord; normal uterus, fallopian tubes and ovaries bilaterally.  Baby condition / location:  Couplet care / Skin to Skin   Complications: no complications. No significant adhesive disease.  Indications: Jessica Horton is a 33 y.o. G2P1001 with an IUP [redacted]w[redacted]d presenting for scheduled elective repeat cesarean.  The risks, benefits, complications, treatment options, and exected outcomes were discussed with the patient . The patient dwith the proposed plan, giving informed consent. identified as [name] and the procedure verified as C-Section Delivery.  Procedure Details:  The patient was taken back to the operative suite where spinal anesthesia was placed.  A time out was held and the above information confirmed.   TXA given for bleeding ppx.  After induction of anesthesia, the patient was draped and prepped in the usual sterile manner and placed in a dorsal supine  position with a leftward tilt. A Pfannenstiel incision was made and carried down through the subcutaneous tissue to the fascia. Fascial incision was made and bluntly extended transversely. The fascia was separated from the underlying rectus tissue superiorly and inferiorly. The peritoneum was identified and bluntly entered and extended longitudinally. Alexis retractor was placed. A bladder flap was not created. A low transverse uterine incision was made and extended bluntly. Delivered from cephalic presentation was a viable infant with Apgars and weight as above.  After waiting 60 seconds for delayed cord cutting, the umbilical cord was clamped and cut cord blood was obtained for evaluation. Cord ph was not sent. The placenta was removed Intact and appeared normal. The uterine outline, tubes and ovaries appeared normal. The uterine incision was closed with running unlocked sutures 0-Vicryl in one layer.   Hemostasis was observed. The peritoneum was closed with 2-0 vicryl. The rectus muscles were examined and hemostasis observed. The fascia was then reapproximated with running sutures of 0-Vicryl. The subcuticular closure was performed with 2-0 plain gut. The skin was closed with 4-0 Vicryl.  Instrument, sponge, and needle counts were correct prior the abdominal closure and were correct at the conclusion of the case.     Disposition: PACU - hemodynamically stable.   Maternal Condition: stable       Signed: Devaughn KATHEE Ban, MD 10/15/2024 1:09 PM

## 2024-10-15 NOTE — H&P (Signed)
 LABOR AND DELIVERY ADMISSION HISTORY AND PHYSICAL NOTE  Jessica Horton is a 33 y.o. female G2P1001 with IUP at [redacted]w[redacted]d by L/first presenting for scheduled elective repeat cesarean.   She reports positive fetal movement. She denies leakage of fluid or vaginal bleeding.  Prenatal History/Complications:  Past Medical History: Past Medical History:  Diagnosis Date   Anemia    Asthma    Asthma    Gestational diabetes    Headache(784.0)    Infertility, female    Migraine    Pre-diabetes    Vitamin D  deficiency     Past Surgical History: Past Surgical History:  Procedure Laterality Date   CESAREAN SECTION N/A 01/24/2014   Procedure: CESAREAN SECTION;  Surgeon: Nena DELENA App, MD;  Location: [facility];  Service: Obstetrics;  Laterality: N/A;   WISDOM TOOTH EXTRACTION      Obstetrical History: OB History     Gravida  2   Para  1   Term  1   Preterm  0   AB  0   Living  1      SAB  0   IAB  0   Ectopic  0   Multiple  0   Live Births  1           Social History: Social History   Socioeconomic History   Marital status: Married    Spouse name: Not on file   Number of children: Not on file   Years of education: Not on file   Highest education level: Not on file  Occupational History   Not on file  Tobacco Use   Smoking status: Never   Smokeless tobacco: Never  Vaping Use   Vaping status: Never Used  Substance and Sexual Activity   Alcohol use: No   Drug use: No   Sexual activity: Yes    Birth control/protection: None  Other Topics Concern   Not on file  Social History Narrative   Not on file   Social Drivers of Health   Financial Resource Strain: Not on file  Food Insecurity: No Food Insecurity (10/15/2024)   Hunger Vital Sign    Worried About Running Out of Food in the Last Year: Never true    Ran Out of Food in the Last Year: Never true  Transportation Needs: No Transportation Needs (10/15/2024)   PRAPARE - Therapist, art (Medical): No    Lack of Transportation (Non-Medical): No  Physical Activity: Not on file  Stress: Not on file  Social Connections: Not on file    Family History: Family History  Problem Relation Age of Onset   Hypertension Mother    Diabetes Mother    Hypertension Father    Heart disease Father 6       CAD/MI   High Cholesterol Father    Diabetes Mellitus I Sister    Diabetes Maternal Aunt    Diabetes Maternal Uncle     Allergies: Allergies  Allergen Reactions   Yellow Jacket Venom Anaphylaxis    Medications Prior to Admission  Medication Sig Dispense Refill Last Dose/Taking   albuterol  (VENTOLIN  HFA) 108 (90 Base) MCG/ACT inhaler Inhale 1-2 puffs into the lungs every 6 (six) hours as needed for wheezing or shortness of breath. 6.7 g 6 Taking As Needed   cyclobenzaprine  (FLEXERIL ) 10 MG tablet Take 1 tablet (10 mg total) by mouth every 8 (eight) hours as needed for muscle spasms. 30 tablet 6 Taking As  Needed   Ferric Maltol  30 MG CAPS Take 1 capsule (30 mg total) by mouth 2 (two) times daily. Please take one hour before breakfast and dinner 60 capsule 2 Taking   metoCLOPramide  (REGLAN ) 10 MG tablet Take 1 tablet (10 mg total) by mouth 4 (four) times daily. (Patient taking differently: Take 10 mg by mouth every 6 (six) hours as needed for nausea or vomiting.) 30 tablet 6 Taking Differently   ondansetron  (ZOFRAN -ODT) 4 MG disintegrating tablet Dissolve 1 tablet (4 mg total) in mouth every 8 (eight) hours as needed for nausea or vomiting. 10 tablet 0 Taking As Needed   Prenatal Vit-Fe Fumarate-FA (MULTIVITAMIN-PRENATAL) 27-0.8 MG TABS tablet Take 1 tablet by mouth daily at 12 noon. Gummies   Taking   Accu-Chek Softclix Lancets lancets 1 each in the morning, at noon, in the evening, and at bedtime. 100 each 4    butalbital -acetaminophen -caffeine  (FIORICET ) 50-325-40 MG tablet Take 1 tablet by mouth every 6 (six) hours as needed for headache. (Patient not  taking: No sig reported) 30 tablet 0 Not Taking   glucose blood (ACCU-CHEK GUIDE TEST) test strip Use to check blood sugar 4 times daily as instructed. 100 each 12      Review of Systems   All systems reviewed and negative except as stated in HPI  Blood pressure 103/72, pulse 70, temperature 98.4 F (36.9 C), temperature source Oral, resp. rate 18, height 5' 2 (1.575 m), weight 99.9 kg, last menstrual period 01/16/2024. General appearance: alert, cooperative, and appears stated age Lungs: clear to auscultation bilaterally Heart: regular rate and rhythm Abdomen: soft, non-tender; bowel sounds normal Extremities: No calf swelling or tenderness Presentation: cephalic Fetal monitoring: 130s Uterine activity: quiet     Prenatal labs: ABO, Rh: --/--/PENDING (10/17 1052) Antibody: NEG (10/15 0946) Rubella: 4.29 (04/14 1728) RPR: NON REACTIVE (10/15 1000)  HBsAg: Negative (04/14 1728)  HIV: Non Reactive (08/12 0842)  GBS: Negative/-- (10/01 1019)  2 hr Glucola: abnormal Genetic screening:  abnormal (elevated risk 22q deletion, declined amio) Anatomy US : wnl  Prenatal Transfer Tool  Maternal Diabetes: Yes:  Diabetes Type:  Diet controlled Genetic Screening: Abnormal:  Results: Other: poss 22q deletion Maternal Ultrasounds/Referrals: Normal Fetal Ultrasounds or other Referrals:  Fetal echo normal Maternal Substance Abuse:  No Significant Maternal Medications:  None Significant Maternal Lab Results: Group B Strep negative  Results for orders placed or performed during the hospital encounter of 10/15/24 (from the past 24 hours)  Glucose, capillary   Collection Time: 10/15/24 10:39 AM  Result Value Ref Range   Glucose-Capillary 81 70 - 99 mg/dL  ABO/Rh   Collection Time: 10/15/24 10:52 AM  Result Value Ref Range   ABO/RH(D) PENDING     Patient Active Problem List   Diagnosis Date Noted   Gestational diabetes mellitus, class A1 08/17/2024   H/O: C-section 08/17/2024    Anemia affecting pregnancy in third trimester 08/12/2024   Low-lying placenta (resolved) 07/12/2024   Vitamin D  deficiency 07/12/2024   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 G2P1001 at [redacted]w[redacted]d here for scheduled elective repeat cesarean. Risks/benefits of tolac reviewed, patient affirms desire to proceed with repeat cesarean.  The risks of cesarean section were discussed with the patient including but were not limited to: bleeding which  may require transfusion or reoperation; infection which may require antibiotics; injury to bowel, bladder, ureters or other surrounding organs; injury to the fetus; need for additional procedures including hysterectomy in the event of a life-threatening hemorrhage; placental abnormalities wth subsequent pregnancies, incisional problems, thromboembolic phenomenon and other postoperative/anesthesia complications.   Patient has been NPO since midnight she will remain NPO for procedure. Anesthesia and OR aware.  Preoperative prophylactic antibiotics and SCDs ordered on call to the OR.  To OR when ready.  # 22q deletion risk: normal fetal echo, declined amio, will alert peds  # a1gdm: well controlled, normal fasting today  # circ: yes, inpt  # feeding: breast/bottle  # contraception: nothing for the time being  Devaughn KATHEE Ban 10/15/2024, 11:24 AM

## 2024-10-15 NOTE — Anesthesia Postprocedure Evaluation (Signed)
 Anesthesia Post Note  Patient: Jessica Horton  Procedure(s) Performed: CESAREAN DELIVERY     Patient location during evaluation: PACU Anesthesia Type: Spinal Level of consciousness: awake Pain management: pain level controlled Vital Signs Assessment: post-procedure vital signs reviewed and stable Respiratory status: spontaneous breathing, respiratory function stable and nonlabored ventilation Cardiovascular status: blood pressure returned to baseline and stable Postop Assessment: no headache, no backache and no apparent nausea or vomiting Anesthetic complications: no   No notable events documented.  Last Vitals:  Vitals:   10/15/24 1435 10/15/24 1535  BP: 114/64 117/74  Pulse: 64 62  Resp: 18 18  Temp: 36.9 C 36.9 C  SpO2:      Last Pain:  Vitals:   10/15/24 1535  TempSrc: Oral  PainSc:                  Delon Aisha Arch

## 2024-10-15 NOTE — Anesthesia Procedure Notes (Addendum)
 Spinal  Patient location during procedure: OR Start time: 10/15/2024 11:48 AM End time: 10/15/2024 11:53 AM Reason for block: surgical anesthesia Staffing Performed: other anesthesia staff  Anesthesiologist: Peggye Delon Brunswick, MD Other anesthesia staff: Pecolia Jayson SQUIBB, RN Performed by: Ellison Elida RAMAN, CRNA Authorized by: Ellison Elida RAMAN, CRNA   Preanesthetic Checklist Completed: patient identified, IV checked, site marked, risks and benefits discussed, surgical consent, monitors and equipment checked, pre-op evaluation and timeout performed Spinal Block Patient position: sitting Prep: Betasept Patient monitoring: heart rate, continuous pulse ox and blood pressure Approach: midline Location: L3-4 Injection technique: single-shot Needle Needle type: Pencan  Needle gauge: 24 G Assessment Sensory level: T4 Additional Notes I was present for spinal placement by the SRNA. ADELIA Peggye, MD

## 2024-10-16 ENCOUNTER — Encounter (HOSPITAL_COMMUNITY): Payer: Self-pay | Admitting: Obstetrics and Gynecology

## 2024-10-16 ENCOUNTER — Other Ambulatory Visit (HOSPITAL_COMMUNITY): Payer: Self-pay

## 2024-10-16 NOTE — Progress Notes (Signed)
 Rn pulled foley at 06300 Am.

## 2024-10-16 NOTE — Plan of Care (Signed)
   Problem: Education: Goal: Knowledge of General Education information will improve Description: Including pain rating scale, medication(s)/side effects and non-pharmacologic comfort measures Outcome: Completed/Met

## 2024-10-16 NOTE — Progress Notes (Signed)
 Postpartum Day 1: Cesarean Delivery  Subjective: Patient reports incisional pain, tolerating PO, and no problems voiding.  No flatus yet. Moderate lochia. Breast and bottle feeding.    Objective: Vital signs in last 24 hours: Temp:  [97.7 F (36.5 C)-98.5 F (36.9 C)] 98.5 F (36.9 C) (10/18 0550) Pulse Rate:  [62-80] 80 (10/17 2346) Resp:  [13-20] 18 (10/18 0550) BP: (80-117)/(50-84) 80/53 (10/18 0550) SpO2:  [91 %-100 %] 100 % (10/18 0550) Weight:  [99.9 kg] 99.9 kg (10/17 1030)  Physical Exam:  General: alert and no distress Lochia: appropriate Uterine Fundus: firm, NT Incision: dressing C/D/I DVT Evaluation: No evidence of DVT seen on physical exam. Negative Homan's sign.No cords or calf tenderness.     Latest Ref Rng & Units 10/13/2024   10:00 AM 08/10/2024    8:42 AM 04/12/2024    5:28 PM  CBC  WBC 4.0 - 10.5 K/uL 8.1  10.8  9.1   Hemoglobin 12.0 - 15.0 g/dL 88.4  89.0  88.6   Hematocrit 36.0 - 46.0 % 35.6  33.2  33.8   Platelets 150 - 400 K/uL 235  242  265     Assessment/Plan: Status post Cesarean section. Doing well postoperatively.  Ordered postoperative CBC, will follow up results and manage accordingly. Continue analgesia as needed, encouraged OOB Undecided about postpartum contraception. Patient desires circumcision for her female infant.  Circumcision procedure details discussed, risks and benefits of procedure were also discussed.  These include but are not limited to: Benefits of circumcision in men include reduction in the rates of urinary tract infection (UTI), penile cancer, some sexually transmitted infections, penile inflammatory and retractile disorders, as well as easier hygiene.  Risks include bleeding , infection, injury of glans which may lead to penile deformity or urinary tract issues, unsatisfactory cosmetic appearance and other potential complications related to the procedure.  It was emphasized that this is an elective procedure.  Patient wants to  proceed with circumcision; written informed consent obtained.  Patient was told circumcision will be performed prior to infant's discharge pending approval by the pediatrician team, routine circumcision and post circumcision care ordered for the infant. Routine postpartum care.   Gloris Hugger, MD 10/16/2024, 10:02 AM

## 2024-10-16 NOTE — Progress Notes (Signed)
 Patient reached 1200 on incentive spirometer.

## 2024-10-17 ENCOUNTER — Other Ambulatory Visit (HOSPITAL_COMMUNITY): Payer: Self-pay

## 2024-10-17 LAB — CBC
HCT: 31 % — ABNORMAL LOW (ref 36.0–46.0)
Hemoglobin: 10.2 g/dL — ABNORMAL LOW (ref 12.0–15.0)
MCH: 29.4 pg (ref 26.0–34.0)
MCHC: 32.9 g/dL (ref 30.0–36.0)
MCV: 89.3 fL (ref 80.0–100.0)
Platelets: 210 K/uL (ref 150–400)
RBC: 3.47 MIL/uL — ABNORMAL LOW (ref 3.87–5.11)
RDW: 14 % (ref 11.5–15.5)
WBC: 12.3 K/uL — ABNORMAL HIGH (ref 4.0–10.5)
nRBC: 0 % (ref 0.0–0.2)

## 2024-10-17 MED ORDER — ACETAMINOPHEN 500 MG PO TABS
1000.0000 mg | ORAL_TABLET | Freq: Four times a day (QID) | ORAL | 0 refills | Status: AC | PRN
  Filled 2024-10-17: qty 60, 8d supply, fill #0

## 2024-10-17 MED ORDER — OXYCODONE HCL 5 MG PO TABS
5.0000 mg | ORAL_TABLET | ORAL | 0 refills | Status: AC | PRN
  Filled 2024-10-17: qty 20, 4d supply, fill #0

## 2024-10-17 MED ORDER — IBUPROFEN 600 MG PO TABS
600.0000 mg | ORAL_TABLET | Freq: Four times a day (QID) | ORAL | 0 refills | Status: AC
  Filled 2024-10-17: qty 30, 8d supply, fill #0

## 2024-10-17 MED ORDER — SENNOSIDES-DOCUSATE SODIUM 8.6-50 MG PO TABS
2.0000 | ORAL_TABLET | Freq: Every evening | ORAL | 0 refills | Status: AC | PRN
  Filled 2024-10-17: qty 30, 15d supply, fill #0

## 2024-10-17 NOTE — Progress Notes (Signed)
 The Rn encouraged safe sleep. Taking off baby's hat. Only one blanket , etc.

## 2024-10-29 ENCOUNTER — Telehealth (HOSPITAL_COMMUNITY): Payer: Self-pay | Admitting: *Deleted

## 2024-10-29 NOTE — Telephone Encounter (Signed)
 10/29/2024  Name: Jessica Horton MRN: [id number] DOB: 07/28/1991  Reason for Call:  Transition of Care Hospital Discharge Call  Contact Status: Patient Contact Status: Complete  Language assistant needed: Interpreter Mode: Interpreter Not Needed        Follow-Up Questions: Do You Have Any Concerns About Your Health As You Heal From Delivery?: No Do You Have Any Concerns About Your Infants Health?: No  Edinburgh Postnatal Depression Scale:  In the Past 7 Days:    PHQ2-9 Depression Scale:     Discharge Follow-up: Edinburgh score requires follow up?:  (Patient says answers are the same as in the hospital when score was 0. She endorses she is doing well emotionally) Patient was advised of the following resources:: Support Group, Breastfeeding Support Group (declines postpartum group information via email)  Post-discharge interventions: Reviewed Newborn Safe Sleep Practices  [name], RN 10/29/2024 13:04

## 2024-11-11 ENCOUNTER — Telehealth: Payer: Self-pay | Admitting: Family Medicine

## 2024-11-11 NOTE — Telephone Encounter (Signed)
 Patient delivered about 3 week ago but is feeling that incision from c-section might be infected. She said it has an odor to it and its always moist no matter how much she cleans it and also a pink color to incision area and cloth when wipe.

## 2024-11-11 NOTE — Telephone Encounter (Signed)
 Called pt and pt informed me that her incision has an odor and is painful around the incision.  Pt denies fever.  Pt agreed to appt on 11/15/24 at 1540 and advised when to go to [facility] for evaluation.   Json Koelzer, RN  11/11/24

## 2024-11-12 ENCOUNTER — Inpatient Hospital Stay (HOSPITAL_COMMUNITY)
Admission: AD | Admit: 2024-11-12 | Discharge: 2024-11-12 | Disposition: A | Discharge status: Home / Self Care | Attending: Obstetrics and Gynecology | Admitting: Obstetrics and Gynecology

## 2024-11-12 DIAGNOSIS — T8141XA Infection following a procedure, superficial incisional surgical site, initial encounter: Secondary | ICD-10-CM | POA: Diagnosis not present

## 2024-11-12 DIAGNOSIS — Y839 Surgical procedure, unspecified as the cause of abnormal reaction of the patient, or of later complication, without mention of misadventure at the time of the procedure: Secondary | ICD-10-CM | POA: Insufficient documentation

## 2024-11-12 MED ORDER — SULFAMETHOXAZOLE-TRIMETHOPRIM 800-160 MG PO TABS: 1.0000 | ORAL_TABLET | Freq: Two times a day (BID) | ORAL | 0 refills | Status: AC

## 2024-11-12 NOTE — MAU Provider Note (Signed)
 History     CSN: [id number]  Arrival date and time: 11/12/24 8195 First Provider Initiated Contact with Patient   Chief Complaint  Patient presents with   Abdominal Pain    HPI Jessica Horton is a 33 y.o. G2P2002 at Unknown, Not found., who presents to the Maternity Assessment Unit for incision check. Patient reports the incision has been malodorous and wet even after showering. She noticed drainage starting a few days ago. She reports removing the honeycomb dressing on about POD4, she did not have an incision check 1w PP. Denies f/c, feeling ill otherise.    Medications Prior to Admission  Medication Sig Dispense Refill Last Dose/Taking   acetaminophen  (TYLENOL ) 500 MG tablet Take 2 tablets (1,000 mg total) by mouth every 6 (six) hours as needed for mild pain (pain score 1-3) or moderate pain (pain score 4-6). 60 tablet 0 Past Month   oxyCODONE  (OXY IR/ROXICODONE ) 5 MG immediate release tablet Take 1 tablet (5 mg total) by mouth every 4 (four) hours as needed (pain scale 4-7). 20 tablet 0 Past Month   Prenatal Vit-Fe Fumarate-FA (MULTIVITAMIN-PRENATAL) 27-0.8 MG TABS tablet Take 1 tablet by mouth daily at 12 noon. Gummies   Past Month   senna-docusate (SENOKOT-S) 8.6-50 MG tablet Take 2 tablets by mouth at bedtime as needed for mild constipation or moderate constipation. 30 tablet 0 Past Month   albuterol  (VENTOLIN  HFA) 108 (90 Base) MCG/ACT inhaler Inhale 1-2 puffs into the lungs every 6 (six) hours as needed for wheezing or shortness of breath. 6.7 g 6 More than a month   ibuprofen  (ADVIL ) 600 MG tablet Take 1 tablet (600 mg total) by mouth every 6 (six) hours. 30 tablet 0 11/10/2024    Past Medical History:  Diagnosis Date   Anemia    Asthma    Asthma    Gestational diabetes    Headache(784.0)    Infertility, female    Migraine    Pre-diabetes    Vitamin D  deficiency     Past Surgical History:  Procedure Laterality Date   CESAREAN SECTION N/A 01/24/2014    Procedure: CESAREAN SECTION;  Surgeon: Nena DELENA App, MD;  Location: [facility];  Service: Obstetrics;  Laterality: N/A;   CESAREAN SECTION N/A 10/15/2024   Procedure: CESAREAN DELIVERY;  Surgeon: Kandis Devaughn Sayres, MD;  Location: [facility] OR[facility];  Service: Obstetrics;  Laterality: N/A;   WISDOM TOOTH EXTRACTION       Allergies:  Allergies  Allergen Reactions   Yellow Jacket Venom Anaphylaxis    ROS reviewed and pertinent positives and negatives as documented in HPI.    Physical Exam  BP 105/70 (BP Location: Right Arm)   Pulse 71   Temp 98.5 F (36.9 C) (Oral)   Resp 12   Ht 5' 2 (1.575 m)   Wt 89 kg   BMI 35.87 kg/m   Gen: alert, no acute distress CV: regular rate Resp: nonlabored Abd: well-approximated incision healing appropriately. Incision is tender without surrounding edema, warmth, or induration. There are small patches of erythema. No exudate expressed. There are 2-3 points along the incision where the suture can be seen coming through the skin rather than fully within the subcutaneous layer.   Assessment and Plan  MDM Jessica Horton is a 33 y.o. G2P2002 at Unknown, Not found., who presents to the MAU for incision check. Ddx: well healing, seroma, abscess, SSI, intertrigo.   Will treat for SSI given how tender the incision is. No signs  of underlying abscess. Rx Bactrim sent.    ICD-10-CM   1. Superficial incisional infection of surgical site  T81.41XA        Results pending at the time of DC: none Dispo: DC home in stable condition with return precautions discussed and included in AVS.    Barabara Maier, DO [facility] for [facility]

## 2024-11-12 NOTE — MAU Note (Addendum)
 Pt says she del by C/S on Friday 10-15-2024. Went home on Sunday 10-19.   All ok. Then on Monday 11-08-2024- started feeling pain and sore at incision.  When patting to dry- it hurts.  Saw drainage on underwear from insion- started Monday, but now bloody draninage - started yesterday .   Foul odor x2 weeks .  [facility]- clinic-  Had chills last week- did not check Temp. Next appointment is 11-22-2024. Breastfeeding  Took Ibuprofen  600mg   1 tab on Wed

## 2024-11-15 ENCOUNTER — Ambulatory Visit

## 2024-11-19 ENCOUNTER — Other Ambulatory Visit: Payer: Self-pay

## 2024-11-19 DIAGNOSIS — O24410 Gestational diabetes mellitus in pregnancy, diet controlled: Secondary | ICD-10-CM

## 2024-11-22 ENCOUNTER — Ambulatory Visit: Admitting: Advanced Practice Midwife

## 2024-11-22 ENCOUNTER — Other Ambulatory Visit

## 2024-11-22 ENCOUNTER — Other Ambulatory Visit: Payer: Self-pay

## 2024-11-22 DIAGNOSIS — B372 Candidiasis of skin and nail: Secondary | ICD-10-CM

## 2024-11-22 DIAGNOSIS — K5900 Constipation, unspecified: Secondary | ICD-10-CM

## 2024-11-22 MED ORDER — POLYETHYLENE GLYCOL 3350 17 GM/SCOOP PO POWD: 17.0000 g | Freq: Every day | ORAL | 1 refills | Status: AC | PRN

## 2024-11-22 MED ORDER — NYSTATIN 100000 UNIT/GM EX POWD: 1.0000 | Freq: Three times a day (TID) | CUTANEOUS | 1 refills | Status: AC

## 2024-11-22 NOTE — Progress Notes (Signed)
 Post Partum Visit Note  Jessica Horton is a 33 y.o. G73P2002 female who presents for a postpartum visit. She is 5.3 weeks postpartum following a repeat cesarean section.  I have fully reviewed the prenatal and intrapartum course. The delivery was at 39 gestational weeks.  Anesthesia: spinal. Postpartum course has been well. Baby is doing well. Baby is feeding by breast. Bleeding no bleeding. Bowel function is abnormal: constipation. Bladder function is normal. Patient is not sexually active. Contraception method is condoms, LAM. Postpartum depression screening: negative.  Was seen in MAU for pain and erythema concerning for incision infection. Pain resolved, but reports painless erythema in skin fold.    Upstream - 11/22/24 1147       Pregnancy Intention Screening   Does the patient want to become pregnant in the next year? No    Does the patient's partner want to become pregnant in the next year? Unsure    Would the patient like to discuss contraceptive options today? Yes      Contraception Wrap Up   Current Method Abstinence    End Method FAM or LAM    Contraception Counseling Provided Yes    How was the end contraceptive method provided? N/A         The pregnancy intention screening data noted above was reviewed. Potential methods of contraception were discussed. The patient elected to proceed with FAM or LAM.   Edinburgh Postnatal Depression Scale - 11/22/24 1146       Edinburgh Postnatal Depression Scale:  In the Past 7 Days   I have been able to laugh and see the funny side of things. 0    I have looked forward with enjoyment to things. 0    I have blamed myself unnecessarily when things went wrong. 0    I have been anxious or worried for no good reason. 0    I have felt scared or panicky for no good reason. 0    Things have been getting on top of me. 0    I have been so unhappy that I have had difficulty sleeping. 0    I have felt sad or miserable. 0    I have  been so unhappy that I have been crying. 0    The thought of harming myself has occurred to me. 0    Edinburgh Postnatal Depression Scale Total 0          Health Maintenance Due  Topic Date Due   Hepatitis B Vaccines 19-59 Average Risk (1 of 3 - 19+ 3-dose series) Never done   HPV VACCINES (1 - 3-dose SCDM series) Never done   Influenza Vaccine  07/30/2024   COVID-19 Vaccine (3 - 2025-26 season) 08/30/2024    The following portions of the patient's history were reviewed and updated as appropriate: allergies, current medications, past family history, past medical history, past social history, past surgical history, and problem list.  Review of Systems Pertinent items are noted in HPI.  Objective:  BP 108/79   Pulse 69   Wt 194 lb 3.2 oz (88.1 kg)   LMP 01/16/2024 (Exact Date)   Breastfeeding Yes   BMI 35.52 kg/m    General:  Alert, cooperative, NAD   Breasts:  not indicated  Lungs: Normal rate and effort  Heart:  Normal rate  Abdomen: Soft, NT, Fundus non-palpable  Wound C/S incision healing well. No drainage, erythema, bleeding, swelling or warmth.   GU exam:  Deferred  Assessment:   1. Breast feeding status of mother (Primary)  2. Postpartum exam  3. Intertriginous candidiasis - nystatin  (MYCOSTATIN /NYSTOP ) powder; Apply 1 Application topically 3 (three) times daily.  Dispense: 15 g; Refill: 1  4. Constipation, unspecified constipation type - polyethylene glycol powder (GLYCOLAX /MIRALAX ) 17 GM/SCOOP powder; Take 17 g by mouth daily as needed.  Dispense: 510 g; Refill: 1    Plan:   Essential components of care per ACOG recommendations:  1.  Mood and well being: Patient with negative depression screening today. Reviewed local resources for support.  - Patient tobacco use? No.   - hx of drug use? No.    2. Infant care and feeding:  -Patient currently breastmilk feeding? Yes. Discussed returning to work and pumping. Reviewed importance of draining breast  regularly to support lactation. Patient needs a work note. Patient was provided letter for work to allow for every 2-3 hr pumping breaks, and to be granted a private location to express breastmilk and refrigerated area to store breastmilk.  -Social determinants of health (SDOH) reviewed in EPIC. No concerns.   3. Sexuality, contraception and birth spacing - Patient does not want a pregnancy in the next year.  Desired family size is 3 children.  - Reviewed reproductive life planning. Reviewed contraceptive methods based on pt preferences and effectiveness.  Patient desired FAM or LAM today.   - Discussed birth spacing of 18 months  4. Sleep and fatigue -Encouraged family/partner/community support of 4 hrs of uninterrupted sleep to help with mood and fatigue  5. Physical Recovery  - Discussed patients delivery and complications. She describes her labor as good. - Patient had a C-section. Patient had a NA laceration. Perineal healing reviewed. Patient expressed understanding - Patient has urinary incontinence? No. - Patient is safe to resume physical and sexual activity  6.  Health Maintenance - HM due items addressed No - NA - Last pap smear  Diagnosis  Date Value Ref Range Status  04/12/2024   Final   - Negative for intraepithelial lesion or malignancy (NILM)   Pap smear not done at today's visit.  -Breast Cancer screening indicated? No.   7. Chronic Disease/Pregnancy Condition follow up: Gestational Diabetes GTT tomorrow  - PCP follow up  Imaad Reuss  [place], [facility] for [employer], [facility]

## 2024-11-23 ENCOUNTER — Other Ambulatory Visit

## 2024-11-23 DIAGNOSIS — O24410 Gestational diabetes mellitus in pregnancy, diet controlled: Secondary | ICD-10-CM

## 2024-11-24 LAB — GLUCOSE TOLERANCE, 2 HOURS
Glucose, 2 hour: 83 mg/dL (ref 70–139)
Glucose, GTT - Fasting: 77 mg/dL (ref 70–99)

## 2024-11-29 ENCOUNTER — Encounter: Payer: Self-pay | Admitting: Advanced Practice Midwife

## 2024-11-30 ENCOUNTER — Ambulatory Visit

## 2024-12-01 ENCOUNTER — Ambulatory Visit: Payer: Self-pay | Admitting: Advanced Practice Midwife
